# Patient Record
Sex: Female | Born: 1935
Health system: Southern US, Community
[De-identification: ages and names within clinical notes are randomized; demographics above are authoritative.]

## PROBLEM LIST (undated history)

## (undated) DIAGNOSIS — M25472 Effusion, left ankle: Secondary | ICD-10-CM

## (undated) DIAGNOSIS — R51 Headache: Secondary | ICD-10-CM

## (undated) DIAGNOSIS — M1990 Unspecified osteoarthritis, unspecified site: Secondary | ICD-10-CM

## (undated) DIAGNOSIS — M25471 Effusion, right ankle: Secondary | ICD-10-CM

## (undated) DIAGNOSIS — K219 Gastro-esophageal reflux disease without esophagitis: Secondary | ICD-10-CM

## (undated) DIAGNOSIS — I10 Essential (primary) hypertension: Secondary | ICD-10-CM

## (undated) DIAGNOSIS — M3320 Polymyositis, organ involvement unspecified: Secondary | ICD-10-CM

## (undated) HISTORY — PX: ABDOMINAL HYSTERECTOMY: SHX81

## (undated) HISTORY — DX: Unspecified osteoarthritis, unspecified site: M19.90

## (undated) HISTORY — PX: APPENDECTOMY: SHX54

## (undated) HISTORY — PX: TONSILLECTOMY: SUR1361

## (undated) HISTORY — DX: Polymyositis, organ involvement unspecified: M33.20

---

## 2012-08-05 DIAGNOSIS — K2270 Barrett's esophagus without dysplasia: Secondary | ICD-10-CM | POA: Diagnosis not present

## 2012-08-05 DIAGNOSIS — R112 Nausea with vomiting, unspecified: Secondary | ICD-10-CM | POA: Diagnosis not present

## 2012-08-05 DIAGNOSIS — Z1211 Encounter for screening for malignant neoplasm of colon: Secondary | ICD-10-CM | POA: Diagnosis not present

## 2012-08-05 DIAGNOSIS — R198 Other specified symptoms and signs involving the digestive system and abdomen: Secondary | ICD-10-CM | POA: Diagnosis not present

## 2012-08-17 DIAGNOSIS — R109 Unspecified abdominal pain: Secondary | ICD-10-CM | POA: Diagnosis not present

## 2012-08-17 DIAGNOSIS — K7689 Other specified diseases of liver: Secondary | ICD-10-CM | POA: Diagnosis not present

## 2012-08-17 DIAGNOSIS — K8020 Calculus of gallbladder without cholecystitis without obstruction: Secondary | ICD-10-CM | POA: Diagnosis not present

## 2012-08-17 DIAGNOSIS — Z90710 Acquired absence of both cervix and uterus: Secondary | ICD-10-CM | POA: Diagnosis not present

## 2012-08-17 DIAGNOSIS — N949 Unspecified condition associated with female genital organs and menstrual cycle: Secondary | ICD-10-CM | POA: Diagnosis not present

## 2012-08-25 DIAGNOSIS — R945 Abnormal results of liver function studies: Secondary | ICD-10-CM | POA: Diagnosis not present

## 2012-08-25 DIAGNOSIS — K769 Liver disease, unspecified: Secondary | ICD-10-CM | POA: Diagnosis not present

## 2012-09-23 DIAGNOSIS — R0602 Shortness of breath: Secondary | ICD-10-CM | POA: Diagnosis not present

## 2012-09-24 DIAGNOSIS — M3390 Dermatopolymyositis, unspecified, organ involvement unspecified: Secondary | ICD-10-CM | POA: Diagnosis not present

## 2012-09-24 DIAGNOSIS — R0789 Other chest pain: Secondary | ICD-10-CM | POA: Diagnosis not present

## 2012-09-24 DIAGNOSIS — D72829 Elevated white blood cell count, unspecified: Secondary | ICD-10-CM | POA: Diagnosis not present

## 2012-09-24 DIAGNOSIS — R002 Palpitations: Secondary | ICD-10-CM | POA: Diagnosis not present

## 2012-09-24 DIAGNOSIS — R635 Abnormal weight gain: Secondary | ICD-10-CM | POA: Diagnosis not present

## 2012-09-24 DIAGNOSIS — M48061 Spinal stenosis, lumbar region without neurogenic claudication: Secondary | ICD-10-CM | POA: Diagnosis not present

## 2012-09-25 DIAGNOSIS — R1033 Periumbilical pain: Secondary | ICD-10-CM | POA: Diagnosis not present

## 2012-10-08 DIAGNOSIS — R5381 Other malaise: Secondary | ICD-10-CM | POA: Diagnosis not present

## 2012-10-08 DIAGNOSIS — M629 Disorder of muscle, unspecified: Secondary | ICD-10-CM | POA: Diagnosis not present

## 2012-10-08 DIAGNOSIS — IMO0001 Reserved for inherently not codable concepts without codable children: Secondary | ICD-10-CM | POA: Diagnosis not present

## 2012-10-20 DIAGNOSIS — D72829 Elevated white blood cell count, unspecified: Secondary | ICD-10-CM | POA: Diagnosis not present

## 2012-10-20 DIAGNOSIS — M3390 Dermatopolymyositis, unspecified, organ involvement unspecified: Secondary | ICD-10-CM | POA: Diagnosis not present

## 2012-10-20 DIAGNOSIS — M48061 Spinal stenosis, lumbar region without neurogenic claudication: Secondary | ICD-10-CM | POA: Diagnosis not present

## 2012-10-20 DIAGNOSIS — R635 Abnormal weight gain: Secondary | ICD-10-CM | POA: Diagnosis not present

## 2012-10-24 DIAGNOSIS — R0602 Shortness of breath: Secondary | ICD-10-CM | POA: Diagnosis not present

## 2012-10-24 DIAGNOSIS — N289 Disorder of kidney and ureter, unspecified: Secondary | ICD-10-CM | POA: Diagnosis not present

## 2012-10-24 DIAGNOSIS — R109 Unspecified abdominal pain: Secondary | ICD-10-CM | POA: Diagnosis not present

## 2012-10-24 DIAGNOSIS — K449 Diaphragmatic hernia without obstruction or gangrene: Secondary | ICD-10-CM | POA: Diagnosis not present

## 2012-10-24 DIAGNOSIS — K7689 Other specified diseases of liver: Secondary | ICD-10-CM | POA: Diagnosis not present

## 2012-10-24 DIAGNOSIS — J984 Other disorders of lung: Secondary | ICD-10-CM | POA: Diagnosis not present

## 2012-10-24 DIAGNOSIS — R911 Solitary pulmonary nodule: Secondary | ICD-10-CM | POA: Diagnosis not present

## 2012-10-27 DIAGNOSIS — M3390 Dermatopolymyositis, unspecified, organ involvement unspecified: Secondary | ICD-10-CM | POA: Diagnosis not present

## 2012-10-27 DIAGNOSIS — R635 Abnormal weight gain: Secondary | ICD-10-CM | POA: Diagnosis not present

## 2012-10-27 DIAGNOSIS — M48061 Spinal stenosis, lumbar region without neurogenic claudication: Secondary | ICD-10-CM | POA: Diagnosis not present

## 2012-10-27 DIAGNOSIS — E041 Nontoxic single thyroid nodule: Secondary | ICD-10-CM | POA: Diagnosis not present

## 2012-11-02 DIAGNOSIS — I10 Essential (primary) hypertension: Secondary | ICD-10-CM | POA: Diagnosis not present

## 2012-11-02 DIAGNOSIS — K219 Gastro-esophageal reflux disease without esophagitis: Secondary | ICD-10-CM | POA: Diagnosis not present

## 2012-11-02 DIAGNOSIS — R609 Edema, unspecified: Secondary | ICD-10-CM | POA: Diagnosis not present

## 2012-11-02 DIAGNOSIS — E782 Mixed hyperlipidemia: Secondary | ICD-10-CM | POA: Diagnosis not present

## 2012-11-04 DIAGNOSIS — R5383 Other fatigue: Secondary | ICD-10-CM | POA: Diagnosis not present

## 2012-11-04 DIAGNOSIS — E782 Mixed hyperlipidemia: Secondary | ICD-10-CM | POA: Diagnosis not present

## 2012-11-04 DIAGNOSIS — R5381 Other malaise: Secondary | ICD-10-CM | POA: Diagnosis not present

## 2012-11-04 DIAGNOSIS — E559 Vitamin D deficiency, unspecified: Secondary | ICD-10-CM | POA: Diagnosis not present

## 2012-11-04 DIAGNOSIS — E785 Hyperlipidemia, unspecified: Secondary | ICD-10-CM | POA: Diagnosis not present

## 2012-11-04 DIAGNOSIS — R0789 Other chest pain: Secondary | ICD-10-CM | POA: Diagnosis not present

## 2012-11-04 DIAGNOSIS — R7989 Other specified abnormal findings of blood chemistry: Secondary | ICD-10-CM | POA: Diagnosis not present

## 2012-11-13 DIAGNOSIS — R7989 Other specified abnormal findings of blood chemistry: Secondary | ICD-10-CM | POA: Diagnosis not present

## 2012-11-13 DIAGNOSIS — H9190 Unspecified hearing loss, unspecified ear: Secondary | ICD-10-CM | POA: Diagnosis not present

## 2012-11-13 DIAGNOSIS — R945 Abnormal results of liver function studies: Secondary | ICD-10-CM | POA: Diagnosis not present

## 2012-11-13 DIAGNOSIS — K2960 Other gastritis without bleeding: Secondary | ICD-10-CM | POA: Diagnosis not present

## 2012-11-16 DIAGNOSIS — I70229 Atherosclerosis of native arteries of extremities with rest pain, unspecified extremity: Secondary | ICD-10-CM | POA: Diagnosis not present

## 2012-11-16 DIAGNOSIS — I70219 Atherosclerosis of native arteries of extremities with intermittent claudication, unspecified extremity: Secondary | ICD-10-CM | POA: Diagnosis not present

## 2012-12-31 DIAGNOSIS — M3320 Polymyositis, organ involvement unspecified: Secondary | ICD-10-CM | POA: Diagnosis not present

## 2012-12-31 DIAGNOSIS — Z1331 Encounter for screening for depression: Secondary | ICD-10-CM | POA: Diagnosis not present

## 2012-12-31 DIAGNOSIS — Z6834 Body mass index (BMI) 34.0-34.9, adult: Secondary | ICD-10-CM | POA: Diagnosis not present

## 2012-12-31 DIAGNOSIS — H9190 Unspecified hearing loss, unspecified ear: Secondary | ICD-10-CM | POA: Diagnosis not present

## 2012-12-31 DIAGNOSIS — K219 Gastro-esophageal reflux disease without esophagitis: Secondary | ICD-10-CM | POA: Diagnosis not present

## 2012-12-31 DIAGNOSIS — R609 Edema, unspecified: Secondary | ICD-10-CM | POA: Diagnosis not present

## 2013-01-16 ENCOUNTER — Encounter (HOSPITAL_COMMUNITY): Payer: Self-pay | Admitting: Emergency Medicine

## 2013-01-16 ENCOUNTER — Emergency Department (HOSPITAL_COMMUNITY): Payer: Medicare Other

## 2013-01-16 ENCOUNTER — Emergency Department (HOSPITAL_COMMUNITY)
Admission: EM | Admit: 2013-01-16 | Discharge: 2013-01-16 | Disposition: A | Discharge status: Home / Self Care | Payer: Medicare Other | Attending: Emergency Medicine | Admitting: Emergency Medicine

## 2013-01-16 DIAGNOSIS — J189 Pneumonia, unspecified organism: Secondary | ICD-10-CM

## 2013-01-16 DIAGNOSIS — I10 Essential (primary) hypertension: Secondary | ICD-10-CM | POA: Diagnosis not present

## 2013-01-16 DIAGNOSIS — J159 Unspecified bacterial pneumonia: Secondary | ICD-10-CM | POA: Insufficient documentation

## 2013-01-16 DIAGNOSIS — Z79899 Other long term (current) drug therapy: Secondary | ICD-10-CM | POA: Diagnosis not present

## 2013-01-16 DIAGNOSIS — R0602 Shortness of breath: Secondary | ICD-10-CM | POA: Diagnosis not present

## 2013-01-16 DIAGNOSIS — Z8739 Personal history of other diseases of the musculoskeletal system and connective tissue: Secondary | ICD-10-CM | POA: Diagnosis not present

## 2013-01-16 DIAGNOSIS — R059 Cough, unspecified: Secondary | ICD-10-CM | POA: Insufficient documentation

## 2013-01-16 DIAGNOSIS — K219 Gastro-esophageal reflux disease without esophagitis: Secondary | ICD-10-CM | POA: Insufficient documentation

## 2013-01-16 DIAGNOSIS — J9819 Other pulmonary collapse: Secondary | ICD-10-CM | POA: Diagnosis not present

## 2013-01-16 DIAGNOSIS — R05 Cough: Secondary | ICD-10-CM | POA: Insufficient documentation

## 2013-01-16 DIAGNOSIS — R079 Chest pain, unspecified: Secondary | ICD-10-CM | POA: Diagnosis not present

## 2013-01-16 HISTORY — DX: Effusion, left ankle: M25.472

## 2013-01-16 HISTORY — DX: Essential (primary) hypertension: I10

## 2013-01-16 HISTORY — DX: Gastro-esophageal reflux disease without esophagitis: K21.9

## 2013-01-16 HISTORY — DX: Effusion, left ankle: M25.471

## 2013-01-16 LAB — PRO B NATRIURETIC PEPTIDE: Pro B Natriuretic peptide (BNP): 71.3 pg/mL (ref 0–450)

## 2013-01-16 LAB — CBC
HCT: 39.5 % (ref 36.0–46.0)
MCH: 28.6 pg (ref 26.0–34.0)
MCV: 81.8 fL (ref 78.0–100.0)
Platelets: 306 10*3/uL (ref 150–400)
RBC: 4.83 MIL/uL (ref 3.87–5.11)
RDW: 15.5 % (ref 11.5–15.5)

## 2013-01-16 LAB — BASIC METABOLIC PANEL
BUN: 20 mg/dL (ref 6–23)
CO2: 29 mEq/L (ref 19–32)
Calcium: 9.4 mg/dL (ref 8.4–10.5)
Creatinine, Ser: 1.19 mg/dL — ABNORMAL HIGH (ref 0.50–1.10)

## 2013-01-16 LAB — POCT I-STAT TROPONIN I: Troponin i, poc: 0.02 ng/mL (ref 0.00–0.08)

## 2013-01-16 MED ORDER — AZITHROMYCIN 250 MG PO TABS
500.0000 mg | ORAL_TABLET | Freq: Once | ORAL | Status: AC
  Administered 2013-01-16: 500 mg via ORAL
  Filled 2013-01-16: qty 2

## 2013-01-16 MED ORDER — ONDANSETRON 4 MG PO TBDP: 4.0000 mg | ORAL_TABLET | Freq: Once | ORAL | Status: AC

## 2013-01-16 MED ORDER — IOHEXOL 350 MG/ML SOLN
100.0000 mL | Freq: Once | INTRAVENOUS | Status: AC | PRN
  Administered 2013-01-16: 100 mL via INTRAVENOUS

## 2013-01-16 MED ORDER — AZITHROMYCIN 250 MG PO TABS: 250.0000 mg | ORAL_TABLET | Freq: Every day | ORAL | Status: DC

## 2013-01-16 MED ORDER — ONDANSETRON 4 MG PO TBDP
ORAL_TABLET | ORAL | Status: AC
  Administered 2013-01-16: 4 mg via ORAL
  Filled 2013-01-16: qty 1

## 2013-01-16 NOTE — ED Notes (Signed)
Pt presents to ED with c/o shortness of breath for the past few weeks but worse today after [facility].

## 2013-01-16 NOTE — ED Notes (Signed)
Patient transported to CT 

## 2013-01-16 NOTE — ED Provider Notes (Signed)
History     CSN: [id number]  Arrival date & time 01/16/13  1604   First MD Initiated Contact with Patient 01/16/13 1737      Chief Complaint  Patient presents with  . Shortness of Breath    (Consider location/radiation/quality/duration/timing/severity/associated sxs/prior treatment) HPI Pt presents with c/o intermittent shortness of breath and cough.  She states symptoms have been going on for the past several weeks.  Not associated with exertion.  Cough is nonproductive.  No fever/chills.  No change in her baseline leg swelling.  Denies chest pain.  Describes a full feeling in her epigastric region.  No palpitations or fainting.  No orthopnea or PND.  Has continued to eat and drink normally.  No recent travel, no trauma or surgery.  No hx of DVT/PE.  No hx of cardiac problems.  There are no other associated systemic symptoms, there are no other alleviating or modifying factors.   Past Medical History  Diagnosis Date  . Hypertension   . GERD (gastroesophageal reflux disease)   . Swelling of both ankles     History reviewed. No pertinent past surgical history.  History reviewed. No pertinent family history.  History  Substance Use Topics  . Smoking status: Not on file  . Smokeless tobacco: Not on file  . Alcohol Use: Not on file    OB History   Grav Para Term Preterm Abortions TAB SAB Ect Mult Living                  Review of Systems ROS reviewed and all otherwise negative except for mentioned in HPI  Allergies  Review of patient's allergies indicates no known allergies.  Home Medications   Current Outpatient Rx  Name  Route  Sig  Dispense  Refill  . amLODipine (NORVASC) 10 MG tablet   Oral   Take 10 mg by mouth daily.         . hydrochlorothiazide (HYDRODIURIL) 25 MG tablet   Oral   Take 25 mg by mouth daily.         Marland Kitchen omeprazole (PRILOSEC) 40 MG capsule   Oral   Take 40 mg by mouth daily.         Marland Kitchen azithromycin (ZITHROMAX) 250 MG tablet    Oral   Take 1 tablet (250 mg total) by mouth daily. Take 1 po qD x 4 days   4 tablet   0     BP 133/37  Pulse 74  Temp(Src) 98.1 F (36.7 C) (Oral)  Resp 17  Ht 5' 4.25" (1.632 m)  Wt 205 lb (92.987 kg)  BMI 34.91 kg/m2  SpO2 96% Vitals reviewed Physical Exam Physical Examination: General appearance - alert, well appearing, and in no distress Mental status - alert, oriented to person, place, and time Eyes - no conjunctival injection, no scleral icterus Mouth - mucous membranes moist, pharynx normal without lesions Chest - clear to auscultation, no wheezes, rales or rhonchi, symmetric air entry, no tenderness to palpation, no increased respiratory rate Heart - normal rate, regular rhythm, normal S1, S2, no murmurs, rubs, clicks or gallops Abdomen - soft, nontender, nondistended, no masses or organomegaly, nabs Extremities - peripheral pulses normal, 1-2+ bilateral pedal edema, no clubbing or cyanosis Skin - normal coloration and turgor, no rashes  ED Course  Procedures (including critical care time)   Date: 01/16/2013  Rate: 87  Rhythm: sinus rhythm with PACs  QRS Axis: normal  Intervals: normal  ST/T Wave abnormalities: nonspecific ST/T changes  Conduction Disutrbances:none  Narrative Interpretation:   Old EKG Reviewed: none available   Labs Reviewed  BASIC METABOLIC PANEL - Abnormal; Notable for the following:    Potassium 3.2 (*)    Chloride 95 (*)    Glucose, Bld 142 (*)    Creatinine, Ser 1.19 (*)    GFR calc non Af Amer 43 (*)    GFR calc Af Amer 50 (*)    All other components within normal limits  D-DIMER, QUANTITATIVE - Abnormal; Notable for the following:    D-Dimer, Quant 0.70 (*)    All other components within normal limits  CBC  PRO B NATRIURETIC PEPTIDE  POCT I-STAT TROPONIN I  POCT I-STAT TROPONIN I   Dg Chest 2 View  01/16/2013   *RADIOLOGY REPORT*  Clinical Data: Chest pain and shortness of breath.  CHEST - 2 VIEW  Comparison: None  Findings:  The cardiac silhouette, mediastinal and hilar contours are within normal limits for age.  The lungs are clear.  No pleural effusion.  The bony thorax is intact.  Mild degenerative changes involving the thoracic spine.  IMPRESSION: No acute cardiopulmonary findings.   Original Report Authenticated By: Rudie Meyer, M.D.   Ct Angio Chest Pe W/cm &/or Wo Cm  01/16/2013   *RADIOLOGY REPORT*  Clinical Data:  Shortness of breath, elevated D-dimer, fullness below the right breast, history hypertension  CT ANGIOGRAPHY CHEST  Technique:  Multidetector CT imaging of the chest using the standard protocol during bolus administration of intravenous contrast. Multiplanar reconstructed images including MIPs were obtained and reviewed to evaluate the vascular anatomy.  Contrast: OMNIPAQUE IOHEXOL 350 MG/ML SOLN  Comparison: None  Findings: Nonspecific low attenuation focus centrally within liver 13 x 8 mm image 78, last image, incompletely visualized. Remaining visualized portions of right abdomen unremarkable. Moderate sized hiatal hernia. No thoracic adenopathy. Aorta normal caliber with minimal atherosclerotic calcification. Pulmonary arteries patent. No evidence of pulmonary embolism.  Calcified granuloma right lower lobe. Dependent atelectasis in both lower lobes. Minimal infiltrate or atelectasis laterally in right upper lobe image 38. Remaining lungs clear. No pleural effusion or pneumothorax. Scattered end plate spur formation thoracic spine.  IMPRESSION: No evidence of pulmonary embolism. Bibasilar atelectasis with small focus of minimal atelectasis or infiltrate in the right upper lobe. Moderate sized hiatal hernia. Nonspecific low attenuation focus centrally in liver 13 x 8 mm, incompletely imaged.   Original Report Authenticated By: Ulyses Southward, M.D.     1. Community acquired pneumonia       MDM  Pt presenting with intermittent shortness of breath over the past several weeks.  Also associated with  cough.  Workup in the ED reveals reassuring EKG and 2 sets of troponin.  BNP not elevated.  CXR reassuring- no evidence of fluid overload.  D-dimer was elevated, so CT angio of chest obtained.  This showed no PE, but small area of possible infiltrate in right upper lobe.  She does also have hiatal hernia incidentally.  Pt started on zithromax for possible pneumonia.  All results d/w patient and son at the bedside.  Discussed the importance of follow up with PMD in the next several days.  Discharged with strict return precautions.  Pt agreeable with plan.        Ethelda Chick, MD 01/16/13 [phone]

## 2013-01-18 DIAGNOSIS — J189 Pneumonia, unspecified organism: Secondary | ICD-10-CM | POA: Diagnosis not present

## 2013-01-18 DIAGNOSIS — R609 Edema, unspecified: Secondary | ICD-10-CM | POA: Diagnosis not present

## 2013-01-18 DIAGNOSIS — M3320 Polymyositis, organ involvement unspecified: Secondary | ICD-10-CM | POA: Diagnosis not present

## 2013-01-18 DIAGNOSIS — R05 Cough: Secondary | ICD-10-CM | POA: Diagnosis not present

## 2013-01-18 DIAGNOSIS — Z6834 Body mass index (BMI) 34.0-34.9, adult: Secondary | ICD-10-CM | POA: Diagnosis not present

## 2013-01-22 DIAGNOSIS — I10 Essential (primary) hypertension: Secondary | ICD-10-CM | POA: Diagnosis not present

## 2013-01-22 DIAGNOSIS — J45909 Unspecified asthma, uncomplicated: Secondary | ICD-10-CM | POA: Diagnosis not present

## 2013-01-22 DIAGNOSIS — Z6834 Body mass index (BMI) 34.0-34.9, adult: Secondary | ICD-10-CM | POA: Diagnosis not present

## 2013-01-22 DIAGNOSIS — R0602 Shortness of breath: Secondary | ICD-10-CM | POA: Diagnosis not present

## 2013-01-22 DIAGNOSIS — K219 Gastro-esophageal reflux disease without esophagitis: Secondary | ICD-10-CM | POA: Diagnosis not present

## 2013-01-26 DIAGNOSIS — M159 Polyosteoarthritis, unspecified: Secondary | ICD-10-CM | POA: Diagnosis not present

## 2013-01-26 DIAGNOSIS — M3320 Polymyositis, organ involvement unspecified: Secondary | ICD-10-CM | POA: Diagnosis not present

## 2013-02-11 ENCOUNTER — Other Ambulatory Visit: Payer: Self-pay | Admitting: Internal Medicine

## 2013-02-11 DIAGNOSIS — R413 Other amnesia: Secondary | ICD-10-CM | POA: Diagnosis not present

## 2013-02-11 DIAGNOSIS — E041 Nontoxic single thyroid nodule: Secondary | ICD-10-CM

## 2013-02-11 DIAGNOSIS — R609 Edema, unspecified: Secondary | ICD-10-CM | POA: Diagnosis not present

## 2013-02-11 DIAGNOSIS — Z6834 Body mass index (BMI) 34.0-34.9, adult: Secondary | ICD-10-CM | POA: Diagnosis not present

## 2013-02-11 DIAGNOSIS — M3320 Polymyositis, organ involvement unspecified: Secondary | ICD-10-CM | POA: Diagnosis not present

## 2013-02-11 DIAGNOSIS — I10 Essential (primary) hypertension: Secondary | ICD-10-CM | POA: Diagnosis not present

## 2013-02-11 DIAGNOSIS — R0602 Shortness of breath: Secondary | ICD-10-CM | POA: Diagnosis not present

## 2013-02-11 DIAGNOSIS — K219 Gastro-esophageal reflux disease without esophagitis: Secondary | ICD-10-CM | POA: Diagnosis not present

## 2013-02-18 DIAGNOSIS — E559 Vitamin D deficiency, unspecified: Secondary | ICD-10-CM | POA: Diagnosis not present

## 2013-02-18 DIAGNOSIS — I10 Essential (primary) hypertension: Secondary | ICD-10-CM | POA: Diagnosis not present

## 2013-02-18 DIAGNOSIS — E041 Nontoxic single thyroid nodule: Secondary | ICD-10-CM | POA: Diagnosis not present

## 2013-02-24 ENCOUNTER — Ambulatory Visit
Admission: RE | Admit: 2013-02-24 | Discharge: 2013-02-24 | Disposition: A | Discharge status: Home / Self Care | Payer: Medicare Other | Source: Ambulatory Visit | Attending: Internal Medicine | Admitting: Internal Medicine

## 2013-02-24 DIAGNOSIS — E041 Nontoxic single thyroid nodule: Secondary | ICD-10-CM

## 2013-02-24 DIAGNOSIS — E042 Nontoxic multinodular goiter: Secondary | ICD-10-CM | POA: Diagnosis not present

## 2013-02-25 DIAGNOSIS — I10 Essential (primary) hypertension: Secondary | ICD-10-CM | POA: Diagnosis not present

## 2013-02-25 DIAGNOSIS — Z0000 Encounter for general adult medical examination without abnormal findings: Secondary | ICD-10-CM | POA: Diagnosis not present

## 2013-02-25 DIAGNOSIS — E559 Vitamin D deficiency, unspecified: Secondary | ICD-10-CM | POA: Diagnosis not present

## 2013-02-25 DIAGNOSIS — M159 Polyosteoarthritis, unspecified: Secondary | ICD-10-CM | POA: Diagnosis not present

## 2013-02-25 DIAGNOSIS — IMO0002 Reserved for concepts with insufficient information to code with codable children: Secondary | ICD-10-CM | POA: Diagnosis not present

## 2013-02-25 DIAGNOSIS — R609 Edema, unspecified: Secondary | ICD-10-CM | POA: Diagnosis not present

## 2013-02-25 DIAGNOSIS — K2270 Barrett's esophagus without dysplasia: Secondary | ICD-10-CM | POA: Diagnosis not present

## 2013-02-25 DIAGNOSIS — M3320 Polymyositis, organ involvement unspecified: Secondary | ICD-10-CM | POA: Diagnosis not present

## 2013-02-25 DIAGNOSIS — R51 Headache: Secondary | ICD-10-CM | POA: Diagnosis not present

## 2013-02-25 DIAGNOSIS — K219 Gastro-esophageal reflux disease without esophagitis: Secondary | ICD-10-CM | POA: Diagnosis not present

## 2013-04-05 DIAGNOSIS — IMO0002 Reserved for concepts with insufficient information to code with codable children: Secondary | ICD-10-CM | POA: Diagnosis not present

## 2013-04-05 DIAGNOSIS — M3320 Polymyositis, organ involvement unspecified: Secondary | ICD-10-CM | POA: Diagnosis not present

## 2013-04-05 DIAGNOSIS — M159 Polyosteoarthritis, unspecified: Secondary | ICD-10-CM | POA: Diagnosis not present

## 2013-04-27 DIAGNOSIS — I10 Essential (primary) hypertension: Secondary | ICD-10-CM | POA: Diagnosis not present

## 2013-04-27 DIAGNOSIS — R51 Headache: Secondary | ICD-10-CM | POA: Diagnosis not present

## 2013-04-27 DIAGNOSIS — M3320 Polymyositis, organ involvement unspecified: Secondary | ICD-10-CM | POA: Diagnosis not present

## 2013-04-27 DIAGNOSIS — Z6834 Body mass index (BMI) 34.0-34.9, adult: Secondary | ICD-10-CM | POA: Diagnosis not present

## 2013-04-27 DIAGNOSIS — Z23 Encounter for immunization: Secondary | ICD-10-CM | POA: Diagnosis not present

## 2013-04-28 DIAGNOSIS — Z8673 Personal history of transient ischemic attack (TIA), and cerebral infarction without residual deficits: Secondary | ICD-10-CM | POA: Diagnosis not present

## 2013-04-28 DIAGNOSIS — R51 Headache: Secondary | ICD-10-CM | POA: Diagnosis not present

## 2013-05-08 ENCOUNTER — Encounter (HOSPITAL_COMMUNITY): Payer: Self-pay | Admitting: Emergency Medicine

## 2013-05-08 ENCOUNTER — Emergency Department (HOSPITAL_COMMUNITY): Payer: Medicare Other

## 2013-05-08 ENCOUNTER — Emergency Department (INDEPENDENT_AMBULATORY_CARE_PROVIDER_SITE_OTHER)
Admission: EM | Admit: 2013-05-08 | Discharge: 2013-05-08 | Disposition: A | Discharge status: Nursing Home (Includes ICF) | Payer: Medicare Other | Source: Home / Self Care | Attending: Family Medicine | Admitting: Family Medicine

## 2013-05-08 ENCOUNTER — Emergency Department (HOSPITAL_COMMUNITY)
Admission: EM | Admit: 2013-05-08 | Discharge: 2013-05-08 | Disposition: A | Discharge status: Home / Self Care | Payer: Medicare Other | Attending: Emergency Medicine | Admitting: Emergency Medicine

## 2013-05-08 DIAGNOSIS — R319 Hematuria, unspecified: Secondary | ICD-10-CM | POA: Insufficient documentation

## 2013-05-08 DIAGNOSIS — M545 Low back pain, unspecified: Secondary | ICD-10-CM | POA: Diagnosis not present

## 2013-05-08 DIAGNOSIS — Z90710 Acquired absence of both cervix and uterus: Secondary | ICD-10-CM | POA: Diagnosis not present

## 2013-05-08 DIAGNOSIS — R748 Abnormal levels of other serum enzymes: Secondary | ICD-10-CM | POA: Diagnosis not present

## 2013-05-08 DIAGNOSIS — Z791 Long term (current) use of non-steroidal anti-inflammatories (NSAID): Secondary | ICD-10-CM | POA: Diagnosis not present

## 2013-05-08 DIAGNOSIS — K8020 Calculus of gallbladder without cholecystitis without obstruction: Secondary | ICD-10-CM | POA: Diagnosis not present

## 2013-05-08 DIAGNOSIS — R1013 Epigastric pain: Secondary | ICD-10-CM | POA: Diagnosis not present

## 2013-05-08 DIAGNOSIS — I10 Essential (primary) hypertension: Secondary | ICD-10-CM | POA: Diagnosis not present

## 2013-05-08 DIAGNOSIS — K219 Gastro-esophageal reflux disease without esophagitis: Secondary | ICD-10-CM | POA: Diagnosis not present

## 2013-05-08 DIAGNOSIS — R197 Diarrhea, unspecified: Secondary | ICD-10-CM | POA: Diagnosis not present

## 2013-05-08 DIAGNOSIS — R52 Pain, unspecified: Secondary | ICD-10-CM

## 2013-05-08 DIAGNOSIS — R7989 Other specified abnormal findings of blood chemistry: Secondary | ICD-10-CM

## 2013-05-08 DIAGNOSIS — Z79899 Other long term (current) drug therapy: Secondary | ICD-10-CM | POA: Insufficient documentation

## 2013-05-08 DIAGNOSIS — Z792 Long term (current) use of antibiotics: Secondary | ICD-10-CM | POA: Insufficient documentation

## 2013-05-08 DIAGNOSIS — R109 Unspecified abdominal pain: Secondary | ICD-10-CM

## 2013-05-08 DIAGNOSIS — R944 Abnormal results of kidney function studies: Secondary | ICD-10-CM | POA: Diagnosis not present

## 2013-05-08 LAB — COMPREHENSIVE METABOLIC PANEL
ALT: 19 U/L (ref 0–35)
AST: 16 U/L (ref 0–37)
Albumin: 3.5 g/dL (ref 3.5–5.2)
Calcium: 9.4 mg/dL (ref 8.4–10.5)
Sodium: 140 mEq/L (ref 135–145)
Total Protein: 6.7 g/dL (ref 6.0–8.3)

## 2013-05-08 LAB — CBC WITH DIFFERENTIAL/PLATELET
Basophils Absolute: 0 10*3/uL (ref 0.0–0.1)
Basophils Relative: 0 % (ref 0–1)
Eosinophils Absolute: 0.1 10*3/uL (ref 0.0–0.7)
Eosinophils Relative: 2 % (ref 0–5)
HCT: 36.3 % (ref 36.0–46.0)
Hemoglobin: 12.9 g/dL (ref 12.0–15.0)
Lymphs Abs: 1.1 10*3/uL (ref 0.7–4.0)
MCH: 30.5 pg (ref 26.0–34.0)
MCHC: 35.5 g/dL (ref 30.0–36.0)
MCV: 85.8 fL (ref 78.0–100.0)
Monocytes Absolute: 0.6 10*3/uL (ref 0.1–1.0)
Monocytes Relative: 7 % (ref 3–12)
Neutro Abs: 6.9 10*3/uL (ref 1.7–7.7)
RDW: 15.3 % (ref 11.5–15.5)

## 2013-05-08 MED ORDER — HYDROCODONE-ACETAMINOPHEN 5-325 MG PO TABS: 1.0000 | ORAL_TABLET | Freq: Four times a day (QID) | ORAL | Status: DC | PRN

## 2013-05-08 NOTE — ED Provider Notes (Signed)
CSN: [id number]     Arrival date & time 05/08/13  1059 History   First MD Initiated Contact with Patient 05/08/13 1115     Chief Complaint  Patient presents with  . Abdominal Pain  . Diarrhea   (Consider location/radiation/quality/duration/timing/severity/associated sxs/prior Treatment) Patient is a 79 y.o. female presenting with abdominal pain and diarrhea.  Abdominal Pain Associated symptoms: diarrhea, hematuria and nausea   Associated symptoms: no chest pain, no constipation, no dysuria, no shortness of breath and no vomiting   Diarrhea Associated symptoms: abdominal pain   Associated symptoms: no headaches and no vomiting   Patient is a 79 yo female with a history of GERD and appendectomy who presents with a one week history of epigastric fullness and increased frequency of stools. Patient notes last Sunday she went out to dinner and has felt sick since then. Noted started out with fullness in epigastrium and bloating with gas. Notes initially had complaint of diarrhea, though once clarified then states had increased frequency of stooling. Has progressively gotten worse. Now pain is 7/10. Notes some nausea. No vomiting, blood in stool, dysuria, chest pain, frequency, HA, vision changes. States has been taking in good amounts of liquid. Patient notes she is not currently on any antibiotics. She is taking her mobic for polymyositis, per the patient.  Past Medical History  Diagnosis Date  . Hypertension   . GERD (gastroesophageal reflux disease)   . Swelling of both ankles    History reviewed. No pertinent past surgical history. History reviewed. No pertinent family history. History  Substance Use Topics  . Smoking status: Never Smoker   . Smokeless tobacco: Not on file  . Alcohol Use: No   OB History   Grav Para Term Preterm Abortions TAB SAB Ect Mult Living                 Review of Systems  Constitutional: Positive for appetite change.  Eyes: Negative for visual  disturbance.  Respiratory: Negative for chest tightness and shortness of breath.   Cardiovascular: Negative for chest pain and leg swelling.  Gastrointestinal: Positive for nausea, abdominal pain and diarrhea. Negative for vomiting, constipation, blood in stool and abdominal distention.  Genitourinary: Positive for hematuria. Negative for dysuria and difficulty urinating.  Musculoskeletal: Positive for back pain (low back).  Neurological: Negative for headaches.  All other systems reviewed and are negative.    Allergies  Review of patient's allergies indicates no known allergies.  Home Medications   Current Outpatient Rx  Name  Route  Sig  Dispense  Refill  . amLODipine (NORVASC) 10 MG tablet   Oral   Take 10 mg by mouth daily.         Marland Kitchen azithromycin (ZITHROMAX) 250 MG tablet   Oral   Take 1 tablet (250 mg total) by mouth daily. Take 1 po qD x 4 days   4 tablet   0   . FOLIC ACID PO   Oral   Take by mouth.         . hydrochlorothiazide (HYDRODIURIL) 25 MG tablet   Oral   Take 25 mg by mouth daily.         . Meloxicam (MOBIC PO)   Oral   Take by mouth.         Marland Kitchen omeprazole (PRILOSEC) 40 MG capsule   Oral   Take 40 mg by mouth daily.          BP 151/62  Pulse 82  Temp(Src) 98.3  F (36.8 C) (Oral)  Resp 18  SpO2 96% Physical Exam  Constitutional: She appears well-developed and well-nourished. No distress.  HENT:  Head: Normocephalic and atraumatic.  Mouth/Throat: Oropharynx is clear and moist.  Eyes: Pupils are equal, round, and reactive to light.  Neck: Neck supple.  Cardiovascular: Normal rate, regular rhythm and normal heart sounds.   Pulmonary/Chest: Effort normal and breath sounds normal. No respiratory distress. She has no wheezes. She has no rales.  Abdominal: Soft. Bowel sounds are normal. She exhibits no distension and no mass. There is tenderness (epigastrium and mild in RUQ). There is no rebound and no guarding.  Musculoskeletal: She  exhibits no edema.  Neurological: She is alert.  Skin: Skin is warm and dry.    ED Course  Procedures (including critical care time) Labs Review Labs Reviewed  COMPREHENSIVE METABOLIC PANEL - Abnormal; Notable for the following:    Glucose, Bld 127 (*)    Creatinine, Ser 1.43 (*)    GFR calc non Af Amer 34 (*)    GFR calc Af Amer 40 (*)    All other components within normal limits  CBC WITH DIFFERENTIAL - Abnormal; Notable for the following:    Neutrophils Relative % 79 (*)    All other components within normal limits  LIPASE, BLOOD   Imaging Review US Abdomen Complete  05/08/2013   CLINICAL DATA:  Abdominal pain.  EXAM: ULTRASOUND ABDOMEN COMPLETE  COMPARISON:  None.  FINDINGS: Gallbladder  Large solitary gallstone is noted without gallbladder wall thickening or pericholecystic fluid. No sonographic Eulah Pont sign is noted.  Common bile duct  Diameter: Measures 3.5 mm which is within normal limits.  Liver  1.5 cm simple cyst is noted in left hepatic lobe. Within normal limits in parenchymal echogenicity.  IVC  No abnormality visualized.  Pancreas  Visualized portion unremarkable.  Spleen  Size and appearance within normal limits.  Right Kidney  Length: Measures 9.1 cm. Echogenicity within normal limits. No mass or hydronephrosis visualized.  Left Kidney  Length: Measures 9.9 cm. Echogenicity within normal limits. No mass or hydronephrosis visualized.  Abdominal aorta  No aneurysm visualized.  IMPRESSION: Large solitary gallstone is noted without evidence of cholecystitis.   Electronically Signed   By: Roque Lias M.D.   On: 05/08/2013 16:05    EKG Interpretation   None       MDM   1. Gallbladder stone without cholecystitis or obstruction   2. Elevated serum creatinine   3. Abdominal pain   4. Diarrhea    11:40 am: Patient seen and examined. Patient with one week history of abdominal fullness and increased stool frequency. Patient has epigastric tenderness and mild RUQ tenderness  to palpation. Still has gall bladder. Is taking meloxicam. Concern in this case would be for pancreatitis, cholelithiasis, GERD, peptic ulcer. Will obtain CBC with diff, CMET, and lipase to evaluate for cause. Imaging decision will be pending lab results.  1:15 pm: spoke with patients son who stated patient has been told she has gall stones previously when they lived in [place]. Discussed this with the patient and she said at one point she was told she had them, but then was told she didn't. Discussed that labs for her pancreas, liver, and gall bladder look normal at this time. Though given this history we will obtain a RUQ [place] to evaluate for gall stones. Additionally her Cr was slightly elevated from previous reading.   [place] abd revealed single large gall stone with no evidence of  cholecystitis. Discussed this finding with the patient and son. There is no indication for urgent surgery at this time with no signs of cholecystitis on [place], stable vital signs, normal AST, ALT, alk phos, and lipase with no elevated WBC. I discussed with the patient and her son that she would benefit from having her gall bladder out, but it would be appropriate to happen in the outpatient setting. Advised that she could continue to have the pain or could develop an infection if she did not have her gall bladder removed in the future. She should avoid fatty foods. They voiced understanding of this and are to call PCP on Monday for follow-up. Additionally patient with rise in Cr from previous reading. Potentially related to meloxicam vs worsening of likely underlying CKD. Advised to stop meloxicam and have Cr rechecked in 1 week. Will give vicodin for pain until f/u arranged with PCP. Patient was given return precautions. This patient was discussed with my attending Dr Radford Pax.  Marikay Alar, MD [facility] PGY-2 05/08/13 7:19 pm  Glori Luis, MD 05/08/13 [facility]

## 2013-05-08 NOTE — ED Provider Notes (Addendum)
CSN: [id number]     Arrival date & time 05/08/13  7829 History   None    Chief Complaint  Patient presents with  . Abdominal Pain   (Consider location/radiation/quality/duration/timing/severity/associated sxs/prior Treatment) Patient is a 79 y.o. female presenting with abdominal pain. The history is provided by the patient.  Abdominal Pain This is a new problem. The current episode started more than 1 week ago. The problem has been gradually worsening. Associated symptoms include abdominal pain.    Past Medical History  Diagnosis Date  . Hypertension   . GERD (gastroesophageal reflux disease)   . Swelling of both ankles    History reviewed. No pertinent past surgical history. History reviewed. No pertinent family history. History  Substance Use Topics  . Smoking status: Never Smoker   . Smokeless tobacco: Not on file  . Alcohol Use: No   OB History   Grav Para Term Preterm Abortions TAB SAB Ect Mult Living                 Review of Systems  Constitutional: Positive for appetite change. Negative for fever.  HENT: Negative.   Respiratory: Negative.   Gastrointestinal: Positive for nausea, abdominal pain and diarrhea. Negative for vomiting, constipation, blood in stool and abdominal distention.  Genitourinary: Negative.     Allergies  Review of patient's allergies indicates no known allergies.  Home Medications   Current Outpatient Rx  Name  Route  Sig  Dispense  Refill  . FOLIC ACID PO   Oral   Take by mouth.         . Meloxicam (MOBIC PO)   Oral   Take by mouth.         Marland Kitchen amLODipine (NORVASC) 10 MG tablet   Oral   Take 10 mg by mouth daily.         Marland Kitchen azithromycin (ZITHROMAX) 250 MG tablet   Oral   Take 1 tablet (250 mg total) by mouth daily. Take 1 po qD x 4 days   4 tablet   0   . hydrochlorothiazide (HYDRODIURIL) 25 MG tablet   Oral   Take 25 mg by mouth daily.         Marland Kitchen omeprazole (PRILOSEC) 40 MG capsule   Oral   Take 40 mg by mouth  daily.          BP 158/83  Pulse 82  Temp(Src) 98.3 F (36.8 C) (Oral)  Resp 20  SpO2 96% Physical Exam  Nursing note and vitals reviewed. Constitutional: She is oriented to person, place, and time. She appears well-developed and well-nourished. She appears distressed.  HENT:  Mouth/Throat: Oropharynx is clear and moist.  Neck: Normal range of motion. Neck supple.  Cardiovascular: Normal rate, regular rhythm, normal heart sounds and intact distal pulses.   Pulmonary/Chest: Effort normal and breath sounds normal.  Abdominal: Soft. Bowel sounds are normal. She exhibits no distension and no mass. There is no hepatosplenomegaly. There is tenderness in the epigastric area. There is no rigidity, no rebound, no guarding, no CVA tenderness, no tenderness at McBurney's point and negative Murphy's sign.  Lymphadenopathy:    She has no cervical adenopathy.  Neurological: She is alert and oriented to person, place, and time.  Skin: Skin is warm and dry.    ED Course  Procedures (including critical care time) Labs Review Labs Reviewed - No data to display Imaging Review No results found.    MDM  Sent for eval of worsening upper abd  pain for 1 week.no fever ,no n/v.    Linna Hoff, MD 05/08/13 1029  Linna Hoff, MD 05/08/13 1030

## 2013-05-08 NOTE — ED Notes (Signed)
Pt sent here from [facility] for mid epigastric pain for over one week, having nausea and diarrhea.

## 2013-05-08 NOTE — ED Notes (Signed)
Patient transported to Ultrasound 

## 2013-05-08 NOTE — ED Notes (Signed)
Contacted [place] tech-approx. eta.

## 2013-05-08 NOTE — ED Notes (Signed)
Pt  Reports  Epigastric   Pain    With   Nausea        X  6 days

## 2013-05-11 ENCOUNTER — Ambulatory Visit (INDEPENDENT_AMBULATORY_CARE_PROVIDER_SITE_OTHER): Payer: Medicare Other | Admitting: General Surgery

## 2013-05-11 ENCOUNTER — Encounter (INDEPENDENT_AMBULATORY_CARE_PROVIDER_SITE_OTHER): Payer: Self-pay | Admitting: General Surgery

## 2013-05-11 VITALS — BP 138/80 | HR 68 | Temp 98.6°F | Resp 14 | Ht 64.25 in | Wt 206.0 lb

## 2013-05-11 DIAGNOSIS — K8020 Calculus of gallbladder without cholecystitis without obstruction: Secondary | ICD-10-CM | POA: Insufficient documentation

## 2013-05-11 NOTE — Progress Notes (Signed)
The patient's office visit was documented as a preoperative history and physical for surgery in the near future.

## 2013-05-11 NOTE — H&P (Signed)
Heather Olson is an 79 y.o. female.   Chief Complaint: Abdominal pain related to large gallstone. HPI: The patient was recently seen in the [facility] emergency department with abdominal pain ultrasound done at that time demonstrated a large gallstone obstructing the neck of the gallbladder. She was sent for surgical consultation.  Past Medical History  Diagnosis Date  . Hypertension   . GERD (gastroesophageal reflux disease)   . Swelling of both ankles   . Arthritis     Past Surgical History  Procedure Laterality Date  . Abdominal hysterectomy      Family History  Problem Relation Age of Onset  . Diabetes Mother    Social History:  reports that she has never smoked. She has never used smokeless tobacco. She reports that she does not drink alcohol or use illicit drugs.  Allergies: No Known Allergies   (Not in a hospital admission)  No results found for this or any previous visit (from the past 48 hour(s)). No results found.  Review of Systems  Constitutional: Negative for fever and chills.  Gastrointestinal: Positive for abdominal pain and diarrhea.  Musculoskeletal: Positive for myalgias (hx of polymyositis).  All other systems reviewed and are negative.    Blood pressure 138/80, pulse 68, temperature 98.6 F (37 C), temperature source Temporal, resp. rate 14, height 5' 4.25" (1.632 m), weight 206 lb (93.441 kg). Physical Exam  Constitutional: She is oriented to person, place, and time. She appears well-developed and well-nourished.  Overweight  HENT:  Head: Normocephalic and atraumatic.  Eyes: Conjunctivae and EOM are normal. Pupils are equal, round, and reactive to light.  Neck: Normal range of motion. Neck supple.  Cardiovascular: Normal rate, regular rhythm, normal heart sounds and intact distal pulses.   Respiratory: Effort normal and breath sounds normal.  GI: Soft. Normal appearance and bowel sounds are normal. There is tenderness in the right upper quadrant.  There is no rigidity, no guarding, no tenderness at McBurney's point and negative Murphy's sign.    Musculoskeletal: Normal range of motion.  Neurological: She is alert and oriented to person, place, and time. She has normal reflexes.  Skin: Skin is warm and dry.  Psychiatric: She has a normal mood and affect. Her behavior is normal. Judgment and thought content normal.     Assessment/Plan Symptomatic cholelithiasis  Needs cholecystectomy with IOC   Will look to next week for surgery.  Cherylynn Ridges 05/11/2013, 11:54 AM

## 2013-05-14 NOTE — ED Provider Notes (Signed)
I saw and evaluated the patient, reviewed the resident's note and I agree with the findings and plan.   .Face to face Exam:  General:  Awake HEENT:  Atraumatic Resp:  Normal effort Abd:  Nondistended Neuro:No focal weakness   Nelia Shi, MD 05/14/13 0030

## 2013-05-24 ENCOUNTER — Encounter (HOSPITAL_COMMUNITY): Payer: Self-pay

## 2013-05-24 ENCOUNTER — Other Ambulatory Visit (HOSPITAL_COMMUNITY): Payer: Self-pay | Admitting: *Deleted

## 2013-05-24 NOTE — Pre-Procedure Instructions (Signed)
Ember Henrikson  05/24/2013   Your procedure is scheduled on:  Friday, May 28, 2013 at 7:30 AM.   Report to [facility] Entrance "A" at 5:30 AM.   Call this number if you have problems the morning of surgery: [phone]   Remember:   Do not eat food or drink liquids after midnight Thursday, 05/17/13.   Take these medicines the morning of surgery with A SIP OF WATER: omeprazole (PRILOSEC), predniSONE (DELTASONE)  Stop Vitamins as of today, 05/25/13.     Do not wear jewelry, make-up or nail polish.  Do not wear lotions, powders, or perfumes. You may wear deodorant.  Do not shave 48 hours prior to surgery.   Do not bring valuables to the hospital.  [facility] is not responsible                  for any belongings or valuables.               Contacts, dentures or bridgework may not be worn into surgery.  Leave suitcase in the car. After surgery it may be brought to your room.  For patients admitted to the hospital, discharge time is determined by your                treatment team.               Patients discharged the day of surgery will not be allowed to drive  home.  Name and phone number of your driver: Family/friend   Special Instructions: Shower using CHG 2 nights before surgery and the night before surgery.  If you shower the day of surgery use CHG.  Use special wash - you have one bottle of CHG for all showers.  You should use approximately 1/3 of the bottle for each shower.   Please read over the following fact sheets that you were given: Pain Booklet, Coughing and Deep Breathing and Surgical Site Infection Prevention

## 2013-05-25 ENCOUNTER — Encounter (HOSPITAL_COMMUNITY)
Admission: RE | Admit: 2013-05-25 | Discharge: 2013-05-25 | Disposition: A | Discharge status: Home / Self Care | Payer: Medicare Other | Source: Ambulatory Visit | Attending: General Surgery | Admitting: General Surgery

## 2013-05-25 ENCOUNTER — Encounter (HOSPITAL_COMMUNITY): Payer: Self-pay

## 2013-05-25 DIAGNOSIS — K219 Gastro-esophageal reflux disease without esophagitis: Secondary | ICD-10-CM | POA: Diagnosis not present

## 2013-05-25 DIAGNOSIS — K8010 Calculus of gallbladder with chronic cholecystitis without obstruction: Secondary | ICD-10-CM | POA: Diagnosis not present

## 2013-05-25 DIAGNOSIS — I10 Essential (primary) hypertension: Secondary | ICD-10-CM | POA: Diagnosis not present

## 2013-05-25 HISTORY — DX: Headache: R51

## 2013-05-25 LAB — COMPREHENSIVE METABOLIC PANEL
ALT: 17 U/L (ref 0–35)
BUN: 19 mg/dL (ref 6–23)
Calcium: 9.5 mg/dL (ref 8.4–10.5)
Creatinine, Ser: 1.36 mg/dL — ABNORMAL HIGH (ref 0.50–1.10)
GFR calc Af Amer: 42 mL/min — ABNORMAL LOW (ref >90)
GFR calc non Af Amer: 36 mL/min — ABNORMAL LOW (ref >90)
Glucose, Bld: 114 mg/dL — ABNORMAL HIGH (ref 70–99)
Potassium: 3.7 mEq/L (ref 3.5–5.1)
Sodium: 141 mEq/L (ref 135–145)
Total Protein: 6.8 g/dL (ref 6.0–8.3)

## 2013-05-25 LAB — CBC WITH DIFFERENTIAL/PLATELET
Eosinophils Absolute: 0.2 10*3/uL (ref 0.0–0.7)
Eosinophils Relative: 2 % (ref 0–5)
HCT: 36.1 % (ref 36.0–46.0)
Lymphocytes Relative: 18 % (ref 12–46)
Lymphs Abs: 1.7 10*3/uL (ref 0.7–4.0)
MCH: 30.7 pg (ref 26.0–34.0)
MCHC: 35.2 g/dL (ref 30.0–36.0)
MCV: 87.2 fL (ref 78.0–100.0)
Monocytes Absolute: 0.5 10*3/uL (ref 0.1–1.0)
Platelets: 333 10*3/uL (ref 150–400)
RBC: 4.14 MIL/uL (ref 3.87–5.11)

## 2013-05-27 MED ORDER — DEXTROSE 5 % IV SOLN
2.0000 g | INTRAVENOUS | Status: AC
  Administered 2013-05-28: 2 g via INTRAVENOUS
  Filled 2013-05-27: qty 2

## 2013-05-28 ENCOUNTER — Ambulatory Visit (HOSPITAL_COMMUNITY): Payer: Medicare Other | Admitting: Anesthesiology

## 2013-05-28 ENCOUNTER — Encounter (HOSPITAL_COMMUNITY): Payer: Medicare Other | Admitting: Anesthesiology

## 2013-05-28 ENCOUNTER — Ambulatory Visit (HOSPITAL_COMMUNITY): Payer: Medicare Other

## 2013-05-28 ENCOUNTER — Encounter (HOSPITAL_COMMUNITY)
Admission: RE | Disposition: A | Discharge status: Home / Self Care | Payer: Self-pay | Source: Ambulatory Visit | Attending: General Surgery

## 2013-05-28 ENCOUNTER — Encounter (HOSPITAL_COMMUNITY): Payer: Self-pay | Admitting: *Deleted

## 2013-05-28 ENCOUNTER — Ambulatory Visit (HOSPITAL_COMMUNITY)
Admission: RE | Admit: 2013-05-28 | Discharge: 2013-05-28 | Disposition: A | Discharge status: Home / Self Care | Payer: Medicare Other | Source: Ambulatory Visit | Attending: General Surgery | Admitting: General Surgery

## 2013-05-28 DIAGNOSIS — K219 Gastro-esophageal reflux disease without esophagitis: Secondary | ICD-10-CM | POA: Diagnosis not present

## 2013-05-28 DIAGNOSIS — I10 Essential (primary) hypertension: Secondary | ICD-10-CM | POA: Insufficient documentation

## 2013-05-28 DIAGNOSIS — K8020 Calculus of gallbladder without cholecystitis without obstruction: Secondary | ICD-10-CM | POA: Diagnosis not present

## 2013-05-28 DIAGNOSIS — R0602 Shortness of breath: Secondary | ICD-10-CM | POA: Diagnosis not present

## 2013-05-28 DIAGNOSIS — K8010 Calculus of gallbladder with chronic cholecystitis without obstruction: Secondary | ICD-10-CM | POA: Insufficient documentation

## 2013-05-28 HISTORY — PX: CHOLECYSTECTOMY: SHX55

## 2013-05-28 SURGERY — LAPAROSCOPIC CHOLECYSTECTOMY WITH INTRAOPERATIVE CHOLANGIOGRAM
Anesthesia: General | Site: Abdomen | Wound class: Clean Contaminated

## 2013-05-28 MED ORDER — HYDROMORPHONE HCL PF 1 MG/ML IJ SOLN
INTRAMUSCULAR | Status: AC
  Filled 2013-05-28: qty 1

## 2013-05-28 MED ORDER — BUPIVACAINE-EPINEPHRINE PF 0.25-1:200000 % IJ SOLN
INTRAMUSCULAR | Status: AC
  Filled 2013-05-28: qty 30

## 2013-05-28 MED ORDER — ROCURONIUM BROMIDE 100 MG/10ML IV SOLN
INTRAVENOUS | Status: DC | PRN
  Administered 2013-05-28: 50 mg via INTRAVENOUS

## 2013-05-28 MED ORDER — OXYCODONE-ACETAMINOPHEN 5-325 MG PO TABS: 1.0000 | ORAL_TABLET | ORAL | Status: DC | PRN

## 2013-05-28 MED ORDER — SODIUM CHLORIDE 0.9 % IR SOLN
Status: DC | PRN
  Administered 2013-05-28: 1000 mL

## 2013-05-28 MED ORDER — PROPOFOL 10 MG/ML IV BOLUS
INTRAVENOUS | Status: DC | PRN
  Administered 2013-05-28: 110 mg via INTRAVENOUS

## 2013-05-28 MED ORDER — FENTANYL CITRATE 0.05 MG/ML IJ SOLN: 50.0000 ug | Freq: Once | INTRAMUSCULAR | Status: DC

## 2013-05-28 MED ORDER — ONDANSETRON HCL 4 MG/2ML IJ SOLN
INTRAMUSCULAR | Status: DC | PRN
  Administered 2013-05-28: 4 mg via INTRAVENOUS

## 2013-05-28 MED ORDER — FENTANYL CITRATE 0.05 MG/ML IJ SOLN
INTRAMUSCULAR | Status: DC | PRN
  Administered 2013-05-28 (×2): 50 ug via INTRAVENOUS

## 2013-05-28 MED ORDER — HYDROMORPHONE HCL PF 1 MG/ML IJ SOLN
0.2500 mg | INTRAMUSCULAR | Status: DC | PRN
  Administered 2013-05-28: 0.5 mg via INTRAVENOUS
  Administered 2013-05-28 (×2): 0.25 mg via INTRAVENOUS

## 2013-05-28 MED ORDER — NEOSTIGMINE METHYLSULFATE 1 MG/ML IJ SOLN
INTRAMUSCULAR | Status: DC | PRN
  Administered 2013-05-28: 4 mg via INTRAVENOUS

## 2013-05-28 MED ORDER — OXYCODONE HCL 5 MG PO TABS
10.0000 mg | ORAL_TABLET | Freq: Once | ORAL | Status: AC
  Administered 2013-05-28: 10 mg via ORAL
  Filled 2013-05-28: qty 2

## 2013-05-28 MED ORDER — MIDAZOLAM HCL 5 MG/5ML IJ SOLN
INTRAMUSCULAR | Status: DC | PRN
  Administered 2013-05-28: 1 mg via INTRAVENOUS

## 2013-05-28 MED ORDER — MIDAZOLAM HCL 2 MG/2ML IJ SOLN: 1.0000 mg | INTRAMUSCULAR | Status: DC | PRN

## 2013-05-28 MED ORDER — OXYCODONE HCL 5 MG PO TABS
5.0000 mg | ORAL_TABLET | Freq: Once | ORAL | Status: DC | PRN
  Filled 2013-05-28: qty 1

## 2013-05-28 MED ORDER — HYDROCODONE-ACETAMINOPHEN 5-325 MG PO TABS: 1.0000 | ORAL_TABLET | Freq: Four times a day (QID) | ORAL | Status: DC | PRN

## 2013-05-28 MED ORDER — GLYCOPYRROLATE 0.2 MG/ML IJ SOLN
INTRAMUSCULAR | Status: DC | PRN
  Administered 2013-05-28: 0.6 mg via INTRAVENOUS

## 2013-05-28 MED ORDER — OXYCODONE HCL 5 MG/5ML PO SOLN: 5.0000 mg | Freq: Once | ORAL | Status: DC | PRN

## 2013-05-28 MED ORDER — BUPIVACAINE-EPINEPHRINE 0.25% -1:200000 IJ SOLN
INTRAMUSCULAR | Status: DC | PRN
  Administered 2013-05-28: 10 mL

## 2013-05-28 MED ORDER — LIDOCAINE HCL (CARDIAC) 20 MG/ML IV SOLN
INTRAVENOUS | Status: DC | PRN
  Administered 2013-05-28: 80 mg via INTRAVENOUS

## 2013-05-28 MED ORDER — CHLORHEXIDINE GLUCONATE 4 % EX LIQD: 1.0000 "application " | Freq: Once | CUTANEOUS | Status: DC

## 2013-05-28 MED ORDER — 0.9 % SODIUM CHLORIDE (POUR BTL) OPTIME
TOPICAL | Status: DC | PRN
  Administered 2013-05-28: 1000 mL

## 2013-05-28 MED ORDER — SODIUM CHLORIDE 0.9 % IV SOLN
INTRAVENOUS | Status: DC | PRN
  Administered 2013-05-28: 08:00:00

## 2013-05-28 MED ORDER — LACTATED RINGERS IV SOLN
INTRAVENOUS | Status: DC | PRN
  Administered 2013-05-28 (×2): via INTRAVENOUS

## 2013-05-28 SURGICAL SUPPLY — 45 items
APPLIER CLIP 5 13 M/L LIGAMAX5 (MISCELLANEOUS) ×2
APPLIER CLIP ROT 10 11.4 M/L (STAPLE)
BLADE SURG ROTATE 9660 (MISCELLANEOUS) IMPLANT
CANISTER SUCTION 2500CC (MISCELLANEOUS) ×2 IMPLANT
CHLORAPREP W/TINT 26ML (MISCELLANEOUS) ×2 IMPLANT
CLIP APPLIE 5 13 M/L LIGAMAX5 (MISCELLANEOUS) ×1 IMPLANT
CLIP APPLIE ROT 10 11.4 M/L (STAPLE) IMPLANT
COVER MAYO STAND STRL (DRAPES) IMPLANT
COVER SURGICAL LIGHT HANDLE (MISCELLANEOUS) ×2 IMPLANT
DECANTER SPIKE VIAL GLASS SM (MISCELLANEOUS) IMPLANT
DERMABOND ADVANCED (GAUZE/BANDAGES/DRESSINGS) ×1
DERMABOND ADVANCED .7 DNX12 (GAUZE/BANDAGES/DRESSINGS) ×1 IMPLANT
DRAPE C-ARM 42X72 X-RAY (DRAPES) IMPLANT
DRAPE UTILITY 15X26 W/TAPE STR (DRAPE) ×4 IMPLANT
ELECT REM PT RETURN 9FT ADLT (ELECTROSURGICAL) ×2
ELECTRODE REM PT RTRN 9FT ADLT (ELECTROSURGICAL) ×1 IMPLANT
GLOVE BIO SURGEON STRL SZ7.5 (GLOVE) ×2 IMPLANT
GLOVE BIOGEL PI IND STRL 7.0 (GLOVE) ×2 IMPLANT
GLOVE BIOGEL PI IND STRL 7.5 (GLOVE) ×1 IMPLANT
GLOVE BIOGEL PI IND STRL 8 (GLOVE) ×1 IMPLANT
GLOVE BIOGEL PI INDICATOR 7.0 (GLOVE) ×2
GLOVE BIOGEL PI INDICATOR 7.5 (GLOVE) ×1
GLOVE BIOGEL PI INDICATOR 8 (GLOVE) ×1
GLOVE ECLIPSE 7.5 STRL STRAW (GLOVE) ×2 IMPLANT
GLOVE SURG SS PI 7.0 STRL IVOR (GLOVE) ×2 IMPLANT
GOWN STRL NON-REIN LRG LVL3 (GOWN DISPOSABLE) ×6 IMPLANT
KIT BASIN OR (CUSTOM PROCEDURE TRAY) ×2 IMPLANT
KIT ROOM TURNOVER OR (KITS) ×2 IMPLANT
NS IRRIG 1000ML POUR BTL (IV SOLUTION) ×2 IMPLANT
PAD ARMBOARD 7.5X6 YLW CONV (MISCELLANEOUS) ×4 IMPLANT
POUCH SPECIMEN RETRIEVAL 10MM (ENDOMECHANICALS) ×2 IMPLANT
SCISSORS LAP 5X35 DISP (ENDOMECHANICALS) IMPLANT
SET CHOLANGIOGRAPH 5 50 .035 (SET/KITS/TRAYS/PACK) IMPLANT
SET IRRIG TUBING LAPAROSCOPIC (IRRIGATION / IRRIGATOR) ×2 IMPLANT
SLEEVE ENDOPATH XCEL 5M (ENDOMECHANICALS) ×4 IMPLANT
SPECIMEN JAR SMALL (MISCELLANEOUS) ×2 IMPLANT
SUT MNCRL AB 4-0 PS2 18 (SUTURE) ×2 IMPLANT
TOWEL OR 17X24 6PK STRL BLUE (TOWEL DISPOSABLE) ×2 IMPLANT
TOWEL OR 17X26 10 PK STRL BLUE (TOWEL DISPOSABLE) ×2 IMPLANT
TOWEL OR NON WOVEN STRL DISP B (DISPOSABLE) ×2 IMPLANT
TRAY LAPAROSCOPIC (CUSTOM PROCEDURE TRAY) ×2 IMPLANT
TROCAR XCEL BLUNT TIP 100MML (ENDOMECHANICALS) ×2 IMPLANT
TROCAR XCEL NON-BLD 11X100MML (ENDOMECHANICALS) IMPLANT
TROCAR XCEL NON-BLD 5MMX100MML (ENDOMECHANICALS) ×2 IMPLANT
WATER STERILE IRR 1000ML POUR (IV SOLUTION) IMPLANT

## 2013-05-28 NOTE — Anesthesia Preprocedure Evaluation (Signed)
Anesthesia Evaluation  Patient identified by MRN, date of birth, ID band Patient awake    Reviewed: Allergy & Precautions, H&P , NPO status , Patient's Chart, lab work & pertinent test results  Airway Mallampati: II TM Distance: >3 FB Neck ROM: Full    Dental   Pulmonary  breath sounds clear to auscultation        Cardiovascular hypertension, Rhythm:Regular Rate:Normal     Neuro/Psych  Headaches,    GI/Hepatic GERD-  ,  Endo/Other    Renal/GU      Musculoskeletal   Abdominal (+) + obese,   Peds  Hematology   Anesthesia Other Findings   Reproductive/Obstetrics                           Anesthesia Physical Anesthesia Plan  ASA: II  Anesthesia Plan: General   Post-op Pain Management:    Induction: Intravenous  Airway Management Planned: Oral ETT  Additional Equipment:   Intra-op Plan:   Post-operative Plan: Extubation in OR  Informed Consent: I have reviewed the patients History and Physical, chart, labs and discussed the procedure including the risks, benefits and alternatives for the proposed anesthesia with the patient or authorized representative who has indicated his/her understanding and acceptance.     Plan Discussed with: CRNA and Surgeon  Anesthesia Plan Comments:         Anesthesia Quick Evaluation

## 2013-05-28 NOTE — H&P (View-Only) (Signed)
Heather Olson is an 79 y.o. female.   Chief Complaint: Abdominal pain related to large gallstone. HPI: The patient was recently seen in the [facility] emergency department with abdominal pain ultrasound done at that time demonstrated a large gallstone obstructing the neck of the gallbladder. She was sent for surgical consultation.  Past Medical History  Diagnosis Date  . Hypertension   . GERD (gastroesophageal reflux disease)   . Swelling of both ankles   . Arthritis     Past Surgical History  Procedure Laterality Date  . Abdominal hysterectomy      Family History  Problem Relation Age of Onset  . Diabetes Mother    Social History:  reports that she has never smoked. She has never used smokeless tobacco. She reports that she does not drink alcohol or use illicit drugs.  Allergies: No Known Allergies   (Not in a hospital admission)  No results found for this or any previous visit (from the past 48 hour(s)). No results found.  Review of Systems  Constitutional: Negative for fever and chills.  Gastrointestinal: Positive for abdominal pain and diarrhea.  Musculoskeletal: Positive for myalgias (hx of polymyositis).  All other systems reviewed and are negative.    Blood pressure 138/80, pulse 68, temperature 98.6 F (37 C), temperature source Temporal, resp. rate 14, height 5' 4.25" (1.632 m), weight 206 lb (93.441 kg). Physical Exam  Constitutional: She is oriented to person, place, and time. She appears well-developed and well-nourished.  Overweight  HENT:  Head: Normocephalic and atraumatic.  Eyes: Conjunctivae and EOM are normal. Pupils are equal, round, and reactive to light.  Neck: Normal range of motion. Neck supple.  Cardiovascular: Normal rate, regular rhythm, normal heart sounds and intact distal pulses.   Respiratory: Effort normal and breath sounds normal.  GI: Soft. Normal appearance and bowel sounds are normal. There is tenderness in the right upper quadrant.  There is no rigidity, no guarding, no tenderness at McBurney's point and negative Murphy's sign.    Musculoskeletal: Normal range of motion.  Neurological: She is alert and oriented to person, place, and time. She has normal reflexes.  Skin: Skin is warm and dry.  Psychiatric: She has a normal mood and affect. Her behavior is normal. Judgment and thought content normal.     Assessment/Plan Symptomatic cholelithiasis  Needs cholecystectomy with IOC   Will look to next week for surgery.  Brenan Modesto O 05/11/2013, 11:54 AM    

## 2013-05-28 NOTE — Progress Notes (Signed)
DR WYATT NOTIFIED OF CLIENT C/O SHORTNESS OF BREATH AFTER SURGERY AND STATES STILL SHORT OF BREATH BUT STATES IT IS SOME BETTER NOW AND NOTIFIED OF SOME BLEEDING AT UMBILICUS AND PER DR WYATT WILL CHANGE DRESSING AT UMBILICUS WITH 4X4 AND GET PORTABLE UPRIGHT CXR

## 2013-05-28 NOTE — Preoperative (Addendum)
Beta Blockers   Reason not to administer Beta Blockers:Not Applicable 

## 2013-05-28 NOTE — Progress Notes (Signed)
Called for post op bed in [facility]

## 2013-05-28 NOTE — Anesthesia Procedure Notes (Signed)
Procedure Name: Intubation Date/Time: 05/28/2013 7:59 AM Performed by: Jerilee Hoh Pre-anesthesia Checklist: Patient identified, Emergency Drugs available, Suction available and Patient being monitored Patient Re-evaluated:Patient Re-evaluated prior to inductionOxygen Delivery Method: Circle system utilized Preoxygenation: Pre-oxygenation with 100% oxygen Intubation Type: IV induction Ventilation: Mask ventilation without difficulty Laryngoscope Size: Mac and 3 Grade View: Grade I Tube type: Oral Tube size: 7.5 mm Number of attempts: 1 Airway Equipment and Method: Stylet Placement Confirmation: ETT inserted through vocal cords under direct vision,  positive ETCO2 and breath sounds checked- equal and bilateral Secured at: 21 cm Tube secured with: Tape Dental Injury: Teeth and Oropharynx as per pre-operative assessment

## 2013-05-28 NOTE — Op Note (Signed)
OPERATIVE REPORT  DATE OF OPERATION: 05/28/2013  PATIENT:  Heather Olson  79 y.o. female  PRE-OPERATIVE DIAGNOSIS:  Symptomatic gallstones  POST-OPERATIVE DIAGNOSIS:  Symptomatic gallstones  PROCEDURE:  Procedure(s): LAPAROSCOPIC CHOLECYSTECTOMY   SURGEON:  Surgeon(s): Cherylynn Ridges, MD  ASSISTANT: None  ANESTHESIA:   general  EBL: <20 ml  BLOOD ADMINISTERED: none  DRAINS: none   SPECIMEN:  Source of Specimen:  Gallbladder with stone  COUNTS CORRECT:  YES  PROCEDURE DETAILS: The patient was taken to the operating room and placed on the table in the supine position.  After an adequate endotracheal anesthetic was administered, she was prepped with ChloroPrep, and then draped in the usual manner exposing the entire abdomen laterally, inferiorly and up  to the costal margins.  After a proper timeout was performed including identifying the patient and the procedure to be performed, a supraumbilical 1.5cm midline incision was made using a #15 blade.  This was taken down to the fascia which was then incised with a #15 blade.  The edges of the fascia were tented up with Kocher clamps as the preperitoneal space was penetrated with a Kelly clamp into the peritoneum.  Once this was done, a pursestring suture of 0 Vicryl was passed around the fascial opening.  This was subsequently used to secure the [facility] cannula which was passed into the peritoneal cavity.  Once the [facility] cannula was in place, carbon dioxide gas was insufflated into the peritoneal cavity up to a maximal intra-abdominal pressure of 15mm Hg.The laparoscope, with attached camera and light source, was passed into the peritoneal cavity to visualize the direct insertion of two right upper quadrant 5mm cannulas, and a sup-xiphoid 5mm cannula.  Once all cannulas were in place, the dissection was begun.  Two ratcheted graspers were attached to the dome and infundibulum of the gallbladder and retracted towards the anterior  abdominal wall and the right upper quadrant.  Using cautery attached to a dissecting forceps, the peritoneum overlaying the triangle of Chalot and the hepatoduodenal triangle was dissected away exposing the cystic duct and the cystic artery.  A clip was placed on the gallbladder side of the cystic duct, and the distal cystic duct was clipped multiple times then transected.  The gallbladder was then dissected out of the hepatic bed without event.  It was retrieved from the abdomen (using an EndoCatch bag) without event.  Once the gallbladder was removed, the bed was inspected for hemostasis.  Once excellent hemostasis was obtained all gas and fluids were aspirated from above the liver, then the cannulas were removed.  The supraumbilical incision was closed using the pursestring suture which was in place.  0.25% bupivicaine with epinephrine was injected at all sites.  All 10mm or greater cannula sites were close using a running subcuticular stitch of 4-0 Monocryl.  5.61mm cannula sites were closed with Dermabond only.Steri-Strips and Tagaderm were used to complete the dressings at all sites.  At this point all needle, sponge, and instrument counts were correct.The patient was awakened from anesthesia and taken to the PACU in stable condition.  PATIENT DISPOSITION:  PACU - hemodynamically stable.   Jimmye Norman O 10/31/20148:44 AM

## 2013-05-28 NOTE — Progress Notes (Signed)
STATES FEELS LIKE GOING HOME NOW; NO C/O SHORTNESS OF BREATH OR DIZZINESS

## 2013-05-28 NOTE — Transfer of Care (Signed)
Immediate Anesthesia Transfer of Care Note  Patient: Heather Olson  Procedure(s) Performed: Procedure(s): LAPAROSCOPIC CHOLECYSTECTOMY  (N/A)  Patient Location: PACU  Anesthesia Type:General  Level of Consciousness: awake, alert  and oriented  Airway & Oxygen Therapy: Patient Spontanous Breathing and Patient connected to nasal cannula oxygen  Post-op Assessment: Report given to PACU RN, Post -op Vital signs reviewed and stable and Patient moving all extremities  Post vital signs: Reviewed and stable  Complications: No apparent anesthesia complications

## 2013-05-28 NOTE — Interval H&P Note (Signed)
History and Physical Interval Note:  05/28/2013 7:43 AM  Heather Olson  has presented today for surgery, with the diagnosis of Symptomatic gallstones  The various methods of treatment have been discussed with the patient and family. After consideration of risks, benefits and other options for treatment, the patient has consented to  Procedure(s): LAPAROSCOPIC CHOLECYSTECTOMY WITH INTRAOPERATIVE CHOLANGIOGRAM (N/A) as a surgical intervention .  The patient's history has been reviewed, patient examined, no change in status, stable for surgery.  I have reviewed the patient's chart and labs.  Questions were answered to the patient's satisfaction.     Bush Murdoch, Marta Lamas

## 2013-05-28 NOTE — Progress Notes (Signed)
DR WYATT NOTIFIED OF CXR RESULTS AND NO NEW ORDERS NOTED

## 2013-05-28 NOTE — Anesthesia Postprocedure Evaluation (Signed)
  Anesthesia Post-op Note  Patient: Heather Olson  Procedure(s) Performed: Procedure(s): LAPAROSCOPIC CHOLECYSTECTOMY  (N/A)  Patient Location: PACU  Anesthesia Type:General  Level of Consciousness: awake and alert   Airway and Oxygen Therapy: Patient Spontanous Breathing  Post-op Pain: mild  Post-op Assessment: Post-op Vital signs reviewed, Patient's Cardiovascular Status Stable, Respiratory Function Stable, Patent Airway, No signs of Nausea or vomiting and Pain level controlled  Post-op Vital Signs: Reviewed and stable  Complications: No apparent anesthesia complications

## 2013-05-28 NOTE — Progress Notes (Signed)
DR Lindie Spruce NOTIFIED CLIENT STATES CANNOT TAKE VICODIN

## 2013-05-28 NOTE — Progress Notes (Signed)
Up to bathroom and c/o dizziness and states does not feel like going home at present

## 2013-05-28 NOTE — Progress Notes (Signed)
O2 SAT 89 ON ROOM AND O2 STARTED AT 2L/MIN AND DR Lindie Spruce NOTIFIED

## 2013-06-01 ENCOUNTER — Encounter (HOSPITAL_COMMUNITY): Payer: Self-pay | Admitting: General Surgery

## 2013-06-04 ENCOUNTER — Telehealth (INDEPENDENT_AMBULATORY_CARE_PROVIDER_SITE_OTHER): Payer: Self-pay | Admitting: *Deleted

## 2013-06-04 MED ORDER — CALCIUM & MAGNESIUM CARBONATES 311-232 MG PO TABS: 1.0000 | ORAL_TABLET | Freq: Two times a day (BID) | ORAL | Status: DC

## 2013-06-04 NOTE — Telephone Encounter (Signed)
Error

## 2013-06-04 NOTE — Telephone Encounter (Signed)
Patient's son called stating that patient is having "severe esophageal pain".  Son states this is keeping her up at night and he is very concerned.  Son denies that patient is having difficulty with breathing.  Spoke to Dr. Lindie Spruce who is calling patient at this time.

## 2013-06-10 DIAGNOSIS — I6350 Cerebral infarction due to unspecified occlusion or stenosis of unspecified cerebral artery: Secondary | ICD-10-CM | POA: Diagnosis not present

## 2013-06-10 DIAGNOSIS — Z6836 Body mass index (BMI) 36.0-36.9, adult: Secondary | ICD-10-CM | POA: Diagnosis not present

## 2013-06-10 DIAGNOSIS — E785 Hyperlipidemia, unspecified: Secondary | ICD-10-CM | POA: Diagnosis not present

## 2013-06-10 DIAGNOSIS — I10 Essential (primary) hypertension: Secondary | ICD-10-CM | POA: Diagnosis not present

## 2013-06-10 DIAGNOSIS — J9819 Other pulmonary collapse: Secondary | ICD-10-CM | POA: Diagnosis not present

## 2013-06-10 DIAGNOSIS — R079 Chest pain, unspecified: Secondary | ICD-10-CM | POA: Diagnosis not present

## 2013-06-10 DIAGNOSIS — S298XXA Other specified injuries of thorax, initial encounter: Secondary | ICD-10-CM | POA: Diagnosis not present

## 2013-06-14 ENCOUNTER — Encounter (HOSPITAL_COMMUNITY): Payer: Self-pay | Admitting: General Surgery

## 2013-06-15 ENCOUNTER — Encounter (INDEPENDENT_AMBULATORY_CARE_PROVIDER_SITE_OTHER): Payer: Self-pay | Admitting: General Surgery

## 2013-06-15 ENCOUNTER — Ambulatory Visit (INDEPENDENT_AMBULATORY_CARE_PROVIDER_SITE_OTHER): Payer: Medicare Other | Admitting: General Surgery

## 2013-06-15 VITALS — BP 140/86 | HR 80 | Temp 97.8°F | Resp 15 | Ht 64.25 in | Wt 209.8 lb

## 2013-06-15 DIAGNOSIS — M159 Polyosteoarthritis, unspecified: Secondary | ICD-10-CM | POA: Diagnosis not present

## 2013-06-15 DIAGNOSIS — Z09 Encounter for follow-up examination after completed treatment for conditions other than malignant neoplasm: Secondary | ICD-10-CM | POA: Insufficient documentation

## 2013-06-15 DIAGNOSIS — IMO0002 Reserved for concepts with insufficient information to code with codable children: Secondary | ICD-10-CM | POA: Diagnosis not present

## 2013-06-15 DIAGNOSIS — N189 Chronic kidney disease, unspecified: Secondary | ICD-10-CM | POA: Diagnosis not present

## 2013-06-15 DIAGNOSIS — M3320 Polymyositis, organ involvement unspecified: Secondary | ICD-10-CM | POA: Diagnosis not present

## 2013-06-15 NOTE — Progress Notes (Signed)
The patient is doing well from a surgical standpoint. Her midline and all the wounds are doing well. The patient fell approximately one week ago and had some left-sided chest pain which is resolving with the use of tramadol.  The patient is somewhat constipated and it was recommended that she take stool softeners and fiber. Otherwise she's doing well and will return to see me on an as-needed basis.

## 2013-07-14 DIAGNOSIS — M3320 Polymyositis, organ involvement unspecified: Secondary | ICD-10-CM | POA: Diagnosis not present

## 2013-08-09 DIAGNOSIS — Z6834 Body mass index (BMI) 34.0-34.9, adult: Secondary | ICD-10-CM | POA: Diagnosis not present

## 2013-08-09 DIAGNOSIS — R51 Headache: Secondary | ICD-10-CM | POA: Diagnosis not present

## 2013-08-09 DIAGNOSIS — J069 Acute upper respiratory infection, unspecified: Secondary | ICD-10-CM | POA: Diagnosis not present

## 2013-09-01 DIAGNOSIS — M3320 Polymyositis, organ involvement unspecified: Secondary | ICD-10-CM | POA: Diagnosis not present

## 2013-09-01 DIAGNOSIS — M159 Polyosteoarthritis, unspecified: Secondary | ICD-10-CM | POA: Diagnosis not present

## 2013-09-01 DIAGNOSIS — IMO0002 Reserved for concepts with insufficient information to code with codable children: Secondary | ICD-10-CM | POA: Diagnosis not present

## 2013-09-01 DIAGNOSIS — N189 Chronic kidney disease, unspecified: Secondary | ICD-10-CM | POA: Diagnosis not present

## 2013-09-22 DIAGNOSIS — J310 Chronic rhinitis: Secondary | ICD-10-CM | POA: Diagnosis not present

## 2013-10-01 DIAGNOSIS — H2510 Age-related nuclear cataract, unspecified eye: Secondary | ICD-10-CM | POA: Diagnosis not present

## 2013-10-01 DIAGNOSIS — H25019 Cortical age-related cataract, unspecified eye: Secondary | ICD-10-CM | POA: Diagnosis not present

## 2013-10-05 DIAGNOSIS — I10 Essential (primary) hypertension: Secondary | ICD-10-CM | POA: Diagnosis not present

## 2013-10-05 DIAGNOSIS — R059 Cough, unspecified: Secondary | ICD-10-CM | POA: Diagnosis not present

## 2013-10-05 DIAGNOSIS — R05 Cough: Secondary | ICD-10-CM | POA: Diagnosis not present

## 2013-10-05 DIAGNOSIS — Z6835 Body mass index (BMI) 35.0-35.9, adult: Secondary | ICD-10-CM | POA: Diagnosis not present

## 2013-10-13 DIAGNOSIS — Z01419 Encounter for gynecological examination (general) (routine) without abnormal findings: Secondary | ICD-10-CM | POA: Diagnosis not present

## 2013-10-13 DIAGNOSIS — Z124 Encounter for screening for malignant neoplasm of cervix: Secondary | ICD-10-CM | POA: Diagnosis not present

## 2013-10-25 DIAGNOSIS — Z1231 Encounter for screening mammogram for malignant neoplasm of breast: Secondary | ICD-10-CM | POA: Diagnosis not present

## 2013-11-05 DIAGNOSIS — IMO0002 Reserved for concepts with insufficient information to code with codable children: Secondary | ICD-10-CM | POA: Diagnosis not present

## 2013-11-05 DIAGNOSIS — N189 Chronic kidney disease, unspecified: Secondary | ICD-10-CM | POA: Diagnosis not present

## 2013-11-05 DIAGNOSIS — M3320 Polymyositis, organ involvement unspecified: Secondary | ICD-10-CM | POA: Diagnosis not present

## 2013-11-05 DIAGNOSIS — M159 Polyosteoarthritis, unspecified: Secondary | ICD-10-CM | POA: Diagnosis not present

## 2013-11-10 DIAGNOSIS — H25049 Posterior subcapsular polar age-related cataract, unspecified eye: Secondary | ICD-10-CM | POA: Diagnosis not present

## 2013-11-10 DIAGNOSIS — H2589 Other age-related cataract: Secondary | ICD-10-CM | POA: Diagnosis not present

## 2013-11-10 DIAGNOSIS — H25019 Cortical age-related cataract, unspecified eye: Secondary | ICD-10-CM | POA: Diagnosis not present

## 2013-11-10 DIAGNOSIS — H2510 Age-related nuclear cataract, unspecified eye: Secondary | ICD-10-CM | POA: Diagnosis not present

## 2014-01-11 DIAGNOSIS — M25519 Pain in unspecified shoulder: Secondary | ICD-10-CM | POA: Diagnosis not present

## 2014-01-11 DIAGNOSIS — R05 Cough: Secondary | ICD-10-CM | POA: Diagnosis not present

## 2014-01-11 DIAGNOSIS — Z6834 Body mass index (BMI) 34.0-34.9, adult: Secondary | ICD-10-CM | POA: Diagnosis not present

## 2014-01-11 DIAGNOSIS — R059 Cough, unspecified: Secondary | ICD-10-CM | POA: Diagnosis not present

## 2014-01-14 DIAGNOSIS — H16209 Unspecified keratoconjunctivitis, unspecified eye: Secondary | ICD-10-CM | POA: Diagnosis not present

## 2014-01-17 DIAGNOSIS — M25519 Pain in unspecified shoulder: Secondary | ICD-10-CM | POA: Diagnosis not present

## 2014-02-07 DIAGNOSIS — M67919 Unspecified disorder of synovium and tendon, unspecified shoulder: Secondary | ICD-10-CM | POA: Diagnosis not present

## 2014-02-07 DIAGNOSIS — M25519 Pain in unspecified shoulder: Secondary | ICD-10-CM | POA: Diagnosis not present

## 2014-02-14 DIAGNOSIS — M159 Polyosteoarthritis, unspecified: Secondary | ICD-10-CM | POA: Diagnosis not present

## 2014-02-14 DIAGNOSIS — N189 Chronic kidney disease, unspecified: Secondary | ICD-10-CM | POA: Diagnosis not present

## 2014-02-14 DIAGNOSIS — M3320 Polymyositis, organ involvement unspecified: Secondary | ICD-10-CM | POA: Diagnosis not present

## 2014-02-14 DIAGNOSIS — IMO0002 Reserved for concepts with insufficient information to code with codable children: Secondary | ICD-10-CM | POA: Diagnosis not present

## 2014-02-15 ENCOUNTER — Other Ambulatory Visit: Payer: Self-pay | Admitting: Orthopedic Surgery

## 2014-02-15 DIAGNOSIS — M25512 Pain in left shoulder: Secondary | ICD-10-CM

## 2014-02-15 DIAGNOSIS — M25569 Pain in unspecified knee: Secondary | ICD-10-CM | POA: Diagnosis not present

## 2014-02-15 DIAGNOSIS — M25511 Pain in right shoulder: Secondary | ICD-10-CM

## 2014-02-20 ENCOUNTER — Other Ambulatory Visit: Payer: Medicare Other

## 2014-02-20 ENCOUNTER — Ambulatory Visit
Admission: RE | Admit: 2014-02-20 | Discharge: 2014-02-20 | Disposition: A | Discharge status: Home / Self Care | Payer: Medicare Other | Source: Ambulatory Visit | Attending: Orthopedic Surgery | Admitting: Orthopedic Surgery

## 2014-02-20 DIAGNOSIS — M25512 Pain in left shoulder: Secondary | ICD-10-CM

## 2014-02-20 DIAGNOSIS — M67919 Unspecified disorder of synovium and tendon, unspecified shoulder: Secondary | ICD-10-CM | POA: Diagnosis not present

## 2014-02-20 DIAGNOSIS — M19019 Primary osteoarthritis, unspecified shoulder: Secondary | ICD-10-CM | POA: Diagnosis not present

## 2014-02-24 DIAGNOSIS — M25569 Pain in unspecified knee: Secondary | ICD-10-CM | POA: Diagnosis not present

## 2014-03-03 DIAGNOSIS — M6281 Muscle weakness (generalized): Secondary | ICD-10-CM | POA: Diagnosis not present

## 2014-03-03 DIAGNOSIS — M25519 Pain in unspecified shoulder: Secondary | ICD-10-CM | POA: Diagnosis not present

## 2014-03-08 ENCOUNTER — Ambulatory Visit (INDEPENDENT_AMBULATORY_CARE_PROVIDER_SITE_OTHER): Payer: Medicare Other | Admitting: Podiatry

## 2014-03-08 ENCOUNTER — Ambulatory Visit: Payer: Self-pay | Admitting: Podiatry

## 2014-03-08 ENCOUNTER — Encounter: Payer: Self-pay | Admitting: Podiatry

## 2014-03-08 ENCOUNTER — Ambulatory Visit (INDEPENDENT_AMBULATORY_CARE_PROVIDER_SITE_OTHER): Payer: Medicare Other

## 2014-03-08 DIAGNOSIS — M2141 Flat foot [pes planus] (acquired), right foot: Secondary | ICD-10-CM

## 2014-03-08 DIAGNOSIS — M2142 Flat foot [pes planus] (acquired), left foot: Principal | ICD-10-CM

## 2014-03-08 DIAGNOSIS — M2140 Flat foot [pes planus] (acquired), unspecified foot: Secondary | ICD-10-CM | POA: Diagnosis not present

## 2014-03-08 DIAGNOSIS — M6789 Other specified disorders of synovium and tendon, multiple sites: Secondary | ICD-10-CM

## 2014-03-08 DIAGNOSIS — M76829 Posterior tibial tendinitis, unspecified leg: Secondary | ICD-10-CM

## 2014-03-08 NOTE — Progress Notes (Signed)
   Subjective:    Patient ID: Heather Olson, female    DOB: 08-15-1932, 80 y.o.   MRN: [id number]  HPI Comments: "I have a fallen arch"  Patient c/o aching arch and medial foot and ankle left for about 2 months. She has a lot of swelling. She has tried OTC arch supports, no help.  Foot Pain      Review of Systems  Musculoskeletal: Positive for gait problem.  All other systems reviewed and are negative.      Objective:   Physical Exam: I have reviewed her past medical history medications allergies surgeries social history and review of systems. Pulses are strongly palpable bilateral. Neurologic sensorium is intact per since once the monofilament. Deep tendon reflexes are intact bilateral muscle strength is 5 over 5 dorsiflexors plantar flexors inverters everters all his musculature is intact with exception of the inverters specifically the posterior tibial tendon of the left foot. It appears that she has posterior tibial tendon dysfunction or PTT D. orthopedic evaluation demonstrates all joints distal to the ankle a full range of motion without crepitation. With exception of the first metatarsophalangeal joint of the left foot which is extended area to previous surgery. She also has severe pes planus with abduction of the midfoot of the rear foot left. Radiographic evaluation confirms this. Cutaneous evaluation demonstrates supple well hydrated cutis no erythema edema cellulitis drainage or odor with exception of the edema around the ankle and the posterior tibial tendon.        Assessment & Plan:  Assessment: Posterior tibial tendon dysfunction left foot. Severe pes planus left foot.  Plan: Discussed etiology pathology conservative versus surgical therapies. At this point we are getting her scheduled for a [place] brace to be casted. I will followup with her once I comes in.

## 2014-03-09 DIAGNOSIS — M25519 Pain in unspecified shoulder: Secondary | ICD-10-CM | POA: Diagnosis not present

## 2014-03-09 DIAGNOSIS — M6281 Muscle weakness (generalized): Secondary | ICD-10-CM | POA: Diagnosis not present

## 2014-03-14 DIAGNOSIS — M25519 Pain in unspecified shoulder: Secondary | ICD-10-CM | POA: Diagnosis not present

## 2014-03-14 DIAGNOSIS — M6281 Muscle weakness (generalized): Secondary | ICD-10-CM | POA: Diagnosis not present

## 2014-03-17 ENCOUNTER — Other Ambulatory Visit: Payer: Medicare Other

## 2014-03-18 DIAGNOSIS — E042 Nontoxic multinodular goiter: Secondary | ICD-10-CM | POA: Diagnosis not present

## 2014-03-18 DIAGNOSIS — E559 Vitamin D deficiency, unspecified: Secondary | ICD-10-CM | POA: Diagnosis not present

## 2014-03-18 DIAGNOSIS — I10 Essential (primary) hypertension: Secondary | ICD-10-CM | POA: Diagnosis not present

## 2014-03-18 DIAGNOSIS — E785 Hyperlipidemia, unspecified: Secondary | ICD-10-CM | POA: Diagnosis not present

## 2014-03-21 DIAGNOSIS — M25519 Pain in unspecified shoulder: Secondary | ICD-10-CM | POA: Diagnosis not present

## 2014-03-21 DIAGNOSIS — M6281 Muscle weakness (generalized): Secondary | ICD-10-CM | POA: Diagnosis not present

## 2014-03-23 DIAGNOSIS — M6281 Muscle weakness (generalized): Secondary | ICD-10-CM | POA: Diagnosis not present

## 2014-03-23 DIAGNOSIS — M25519 Pain in unspecified shoulder: Secondary | ICD-10-CM | POA: Diagnosis not present

## 2014-03-24 ENCOUNTER — Ambulatory Visit: Payer: Self-pay | Admitting: Podiatry

## 2014-03-24 DIAGNOSIS — M25569 Pain in unspecified knee: Secondary | ICD-10-CM | POA: Diagnosis not present

## 2014-03-28 ENCOUNTER — Other Ambulatory Visit: Payer: Medicare Other

## 2014-03-28 ENCOUNTER — Other Ambulatory Visit: Payer: Medicare Other | Admitting: *Deleted

## 2014-03-28 DIAGNOSIS — M6281 Muscle weakness (generalized): Secondary | ICD-10-CM | POA: Diagnosis not present

## 2014-03-28 DIAGNOSIS — M25519 Pain in unspecified shoulder: Secondary | ICD-10-CM | POA: Diagnosis not present

## 2014-03-28 DIAGNOSIS — M3320 Polymyositis, organ involvement unspecified: Secondary | ICD-10-CM | POA: Diagnosis not present

## 2014-04-01 DIAGNOSIS — M1710 Unilateral primary osteoarthritis, unspecified knee: Secondary | ICD-10-CM | POA: Diagnosis not present

## 2014-04-07 DIAGNOSIS — M1710 Unilateral primary osteoarthritis, unspecified knee: Secondary | ICD-10-CM | POA: Diagnosis not present

## 2014-04-12 DIAGNOSIS — H3500 Unspecified background retinopathy: Secondary | ICD-10-CM | POA: Diagnosis not present

## 2014-04-12 DIAGNOSIS — H25019 Cortical age-related cataract, unspecified eye: Secondary | ICD-10-CM | POA: Diagnosis not present

## 2014-04-12 DIAGNOSIS — H25049 Posterior subcapsular polar age-related cataract, unspecified eye: Secondary | ICD-10-CM | POA: Diagnosis not present

## 2014-04-12 DIAGNOSIS — H2510 Age-related nuclear cataract, unspecified eye: Secondary | ICD-10-CM | POA: Diagnosis not present

## 2014-04-14 DIAGNOSIS — M3320 Polymyositis, organ involvement unspecified: Secondary | ICD-10-CM | POA: Diagnosis not present

## 2014-04-14 DIAGNOSIS — N189 Chronic kidney disease, unspecified: Secondary | ICD-10-CM | POA: Diagnosis not present

## 2014-04-14 DIAGNOSIS — IMO0002 Reserved for concepts with insufficient information to code with codable children: Secondary | ICD-10-CM | POA: Diagnosis not present

## 2014-04-14 DIAGNOSIS — M159 Polyosteoarthritis, unspecified: Secondary | ICD-10-CM | POA: Diagnosis not present

## 2014-04-18 DIAGNOSIS — Z1212 Encounter for screening for malignant neoplasm of rectum: Secondary | ICD-10-CM | POA: Diagnosis not present

## 2014-04-20 DIAGNOSIS — M25819 Other specified joint disorders, unspecified shoulder: Secondary | ICD-10-CM | POA: Diagnosis not present

## 2014-04-20 DIAGNOSIS — M75120 Complete rotator cuff tear or rupture of unspecified shoulder, not specified as traumatic: Secondary | ICD-10-CM | POA: Diagnosis not present

## 2014-04-20 DIAGNOSIS — M719 Bursopathy, unspecified: Secondary | ICD-10-CM | POA: Diagnosis not present

## 2014-04-20 DIAGNOSIS — M24119 Other articular cartilage disorders, unspecified shoulder: Secondary | ICD-10-CM | POA: Diagnosis not present

## 2014-04-20 DIAGNOSIS — M67919 Unspecified disorder of synovium and tendon, unspecified shoulder: Secondary | ICD-10-CM | POA: Diagnosis not present

## 2014-04-20 DIAGNOSIS — G8918 Other acute postprocedural pain: Secondary | ICD-10-CM | POA: Diagnosis not present

## 2014-04-20 DIAGNOSIS — M19019 Primary osteoarthritis, unspecified shoulder: Secondary | ICD-10-CM | POA: Diagnosis not present

## 2014-05-06 DIAGNOSIS — M25612 Stiffness of left shoulder, not elsewhere classified: Secondary | ICD-10-CM | POA: Diagnosis not present

## 2014-05-06 DIAGNOSIS — M25512 Pain in left shoulder: Secondary | ICD-10-CM | POA: Diagnosis not present

## 2014-05-06 DIAGNOSIS — M6281 Muscle weakness (generalized): Secondary | ICD-10-CM | POA: Diagnosis not present

## 2014-05-09 DIAGNOSIS — M25512 Pain in left shoulder: Secondary | ICD-10-CM | POA: Diagnosis not present

## 2014-05-09 DIAGNOSIS — M6281 Muscle weakness (generalized): Secondary | ICD-10-CM | POA: Diagnosis not present

## 2014-05-09 DIAGNOSIS — M25612 Stiffness of left shoulder, not elsewhere classified: Secondary | ICD-10-CM | POA: Diagnosis not present

## 2014-05-11 DIAGNOSIS — M25612 Stiffness of left shoulder, not elsewhere classified: Secondary | ICD-10-CM | POA: Diagnosis not present

## 2014-05-11 DIAGNOSIS — M6281 Muscle weakness (generalized): Secondary | ICD-10-CM | POA: Diagnosis not present

## 2014-05-11 DIAGNOSIS — M25512 Pain in left shoulder: Secondary | ICD-10-CM | POA: Diagnosis not present

## 2014-05-13 DIAGNOSIS — M25612 Stiffness of left shoulder, not elsewhere classified: Secondary | ICD-10-CM | POA: Diagnosis not present

## 2014-05-13 DIAGNOSIS — M25512 Pain in left shoulder: Secondary | ICD-10-CM | POA: Diagnosis not present

## 2014-05-13 DIAGNOSIS — M6281 Muscle weakness (generalized): Secondary | ICD-10-CM | POA: Diagnosis not present

## 2014-05-16 DIAGNOSIS — M25512 Pain in left shoulder: Secondary | ICD-10-CM | POA: Diagnosis not present

## 2014-05-16 DIAGNOSIS — M6281 Muscle weakness (generalized): Secondary | ICD-10-CM | POA: Diagnosis not present

## 2014-05-16 DIAGNOSIS — M25612 Stiffness of left shoulder, not elsewhere classified: Secondary | ICD-10-CM | POA: Diagnosis not present

## 2014-05-17 ENCOUNTER — Ambulatory Visit (INDEPENDENT_AMBULATORY_CARE_PROVIDER_SITE_OTHER): Payer: Medicare Other | Admitting: Podiatry

## 2014-05-17 VITALS — BP 159/83 | HR 84 | Resp 17

## 2014-05-17 DIAGNOSIS — M76829 Posterior tibial tendinitis, unspecified leg: Secondary | ICD-10-CM

## 2014-05-17 DIAGNOSIS — M2141 Flat foot [pes planus] (acquired), right foot: Secondary | ICD-10-CM | POA: Diagnosis not present

## 2014-05-17 DIAGNOSIS — M6789 Other specified disorders of synovium and tendon, multiple sites: Secondary | ICD-10-CM

## 2014-05-17 DIAGNOSIS — M2142 Flat foot [pes planus] (acquired), left foot: Secondary | ICD-10-CM

## 2014-05-17 NOTE — Progress Notes (Signed)
   Subjective:    Patient ID: Heather Olson, female    DOB: 10-28-1932, 80 y.o.   MRN: [id number]  HPI Comments: Pt is fitted with the brace to support the left arch and ankle, and given printed instruction on the care and wearing of the brace.  Pt states she can feel the support of the brace in her ankle.     Review of Systems     Objective:   Physical Exam: Previous evaluation demonstrates posterior tibial tendon dysfunction of the left foot. Severe posterior tibial tendinitis and pes planus.        Assessment & Plan:  Assessment: Pes planus posterior tibial tendon dysfunction left.  Plan: Her [place] brace was dispensed today and I will followup with her in one month.

## 2014-05-18 DIAGNOSIS — M25512 Pain in left shoulder: Secondary | ICD-10-CM | POA: Diagnosis not present

## 2014-05-18 DIAGNOSIS — M6281 Muscle weakness (generalized): Secondary | ICD-10-CM | POA: Diagnosis not present

## 2014-05-18 DIAGNOSIS — M25612 Stiffness of left shoulder, not elsewhere classified: Secondary | ICD-10-CM | POA: Diagnosis not present

## 2014-05-19 DIAGNOSIS — Z23 Encounter for immunization: Secondary | ICD-10-CM | POA: Diagnosis not present

## 2014-05-20 DIAGNOSIS — M25512 Pain in left shoulder: Secondary | ICD-10-CM | POA: Diagnosis not present

## 2014-05-20 DIAGNOSIS — M25612 Stiffness of left shoulder, not elsewhere classified: Secondary | ICD-10-CM | POA: Diagnosis not present

## 2014-05-20 DIAGNOSIS — M6281 Muscle weakness (generalized): Secondary | ICD-10-CM | POA: Diagnosis not present

## 2014-05-25 DIAGNOSIS — M25512 Pain in left shoulder: Secondary | ICD-10-CM | POA: Diagnosis not present

## 2014-05-25 DIAGNOSIS — M25612 Stiffness of left shoulder, not elsewhere classified: Secondary | ICD-10-CM | POA: Diagnosis not present

## 2014-05-25 DIAGNOSIS — M6281 Muscle weakness (generalized): Secondary | ICD-10-CM | POA: Diagnosis not present

## 2014-05-30 DIAGNOSIS — M6281 Muscle weakness (generalized): Secondary | ICD-10-CM | POA: Diagnosis not present

## 2014-05-30 DIAGNOSIS — M25612 Stiffness of left shoulder, not elsewhere classified: Secondary | ICD-10-CM | POA: Diagnosis not present

## 2014-05-30 DIAGNOSIS — M25512 Pain in left shoulder: Secondary | ICD-10-CM | POA: Diagnosis not present

## 2014-06-01 DIAGNOSIS — M25512 Pain in left shoulder: Secondary | ICD-10-CM | POA: Diagnosis not present

## 2014-06-01 DIAGNOSIS — M6281 Muscle weakness (generalized): Secondary | ICD-10-CM | POA: Diagnosis not present

## 2014-06-01 DIAGNOSIS — M25612 Stiffness of left shoulder, not elsewhere classified: Secondary | ICD-10-CM | POA: Diagnosis not present

## 2014-06-03 DIAGNOSIS — M6281 Muscle weakness (generalized): Secondary | ICD-10-CM | POA: Diagnosis not present

## 2014-06-03 DIAGNOSIS — M25612 Stiffness of left shoulder, not elsewhere classified: Secondary | ICD-10-CM | POA: Diagnosis not present

## 2014-06-03 DIAGNOSIS — M25512 Pain in left shoulder: Secondary | ICD-10-CM | POA: Diagnosis not present

## 2014-06-06 DIAGNOSIS — M25612 Stiffness of left shoulder, not elsewhere classified: Secondary | ICD-10-CM | POA: Diagnosis not present

## 2014-06-06 DIAGNOSIS — M6281 Muscle weakness (generalized): Secondary | ICD-10-CM | POA: Diagnosis not present

## 2014-06-06 DIAGNOSIS — M25512 Pain in left shoulder: Secondary | ICD-10-CM | POA: Diagnosis not present

## 2014-06-08 DIAGNOSIS — M25612 Stiffness of left shoulder, not elsewhere classified: Secondary | ICD-10-CM | POA: Diagnosis not present

## 2014-06-08 DIAGNOSIS — M6281 Muscle weakness (generalized): Secondary | ICD-10-CM | POA: Diagnosis not present

## 2014-06-08 DIAGNOSIS — M25512 Pain in left shoulder: Secondary | ICD-10-CM | POA: Diagnosis not present

## 2014-06-10 DIAGNOSIS — M6281 Muscle weakness (generalized): Secondary | ICD-10-CM | POA: Diagnosis not present

## 2014-06-10 DIAGNOSIS — M25612 Stiffness of left shoulder, not elsewhere classified: Secondary | ICD-10-CM | POA: Diagnosis not present

## 2014-06-10 DIAGNOSIS — M25512 Pain in left shoulder: Secondary | ICD-10-CM | POA: Diagnosis not present

## 2014-06-13 DIAGNOSIS — M6281 Muscle weakness (generalized): Secondary | ICD-10-CM | POA: Diagnosis not present

## 2014-06-13 DIAGNOSIS — M25612 Stiffness of left shoulder, not elsewhere classified: Secondary | ICD-10-CM | POA: Diagnosis not present

## 2014-06-13 DIAGNOSIS — M25512 Pain in left shoulder: Secondary | ICD-10-CM | POA: Diagnosis not present

## 2014-06-15 DIAGNOSIS — Z23 Encounter for immunization: Secondary | ICD-10-CM | POA: Diagnosis not present

## 2014-06-15 DIAGNOSIS — E042 Nontoxic multinodular goiter: Secondary | ICD-10-CM | POA: Diagnosis not present

## 2014-06-15 DIAGNOSIS — E559 Vitamin D deficiency, unspecified: Secondary | ICD-10-CM | POA: Diagnosis not present

## 2014-06-15 DIAGNOSIS — K219 Gastro-esophageal reflux disease without esophagitis: Secondary | ICD-10-CM | POA: Diagnosis not present

## 2014-06-15 DIAGNOSIS — M25512 Pain in left shoulder: Secondary | ICD-10-CM | POA: Diagnosis not present

## 2014-06-15 DIAGNOSIS — M6281 Muscle weakness (generalized): Secondary | ICD-10-CM | POA: Diagnosis not present

## 2014-06-15 DIAGNOSIS — K2270 Barrett's esophagus without dysplasia: Secondary | ICD-10-CM | POA: Diagnosis not present

## 2014-06-15 DIAGNOSIS — Z0000 Encounter for general adult medical examination without abnormal findings: Secondary | ICD-10-CM | POA: Diagnosis not present

## 2014-06-15 DIAGNOSIS — I10 Essential (primary) hypertension: Secondary | ICD-10-CM | POA: Diagnosis not present

## 2014-06-15 DIAGNOSIS — E785 Hyperlipidemia, unspecified: Secondary | ICD-10-CM | POA: Diagnosis not present

## 2014-06-15 DIAGNOSIS — M25612 Stiffness of left shoulder, not elsewhere classified: Secondary | ICD-10-CM | POA: Diagnosis not present

## 2014-06-15 DIAGNOSIS — H9190 Unspecified hearing loss, unspecified ear: Secondary | ICD-10-CM | POA: Diagnosis not present

## 2014-06-17 DIAGNOSIS — M3320 Polymyositis, organ involvement unspecified: Secondary | ICD-10-CM | POA: Diagnosis not present

## 2014-06-17 DIAGNOSIS — M25612 Stiffness of left shoulder, not elsewhere classified: Secondary | ICD-10-CM | POA: Diagnosis not present

## 2014-06-17 DIAGNOSIS — M25512 Pain in left shoulder: Secondary | ICD-10-CM | POA: Diagnosis not present

## 2014-06-17 DIAGNOSIS — M150 Primary generalized (osteo)arthritis: Secondary | ICD-10-CM | POA: Diagnosis not present

## 2014-06-17 DIAGNOSIS — Z79899 Other long term (current) drug therapy: Secondary | ICD-10-CM | POA: Diagnosis not present

## 2014-06-17 DIAGNOSIS — M6281 Muscle weakness (generalized): Secondary | ICD-10-CM | POA: Diagnosis not present

## 2014-06-17 DIAGNOSIS — N183 Chronic kidney disease, stage 3 (moderate): Secondary | ICD-10-CM | POA: Diagnosis not present

## 2014-06-22 DIAGNOSIS — M6281 Muscle weakness (generalized): Secondary | ICD-10-CM | POA: Diagnosis not present

## 2014-06-22 DIAGNOSIS — M25512 Pain in left shoulder: Secondary | ICD-10-CM | POA: Diagnosis not present

## 2014-06-22 DIAGNOSIS — M25612 Stiffness of left shoulder, not elsewhere classified: Secondary | ICD-10-CM | POA: Diagnosis not present

## 2014-06-27 DIAGNOSIS — M25512 Pain in left shoulder: Secondary | ICD-10-CM | POA: Diagnosis not present

## 2014-06-27 DIAGNOSIS — M6281 Muscle weakness (generalized): Secondary | ICD-10-CM | POA: Diagnosis not present

## 2014-06-27 DIAGNOSIS — M25612 Stiffness of left shoulder, not elsewhere classified: Secondary | ICD-10-CM | POA: Diagnosis not present

## 2014-06-29 DIAGNOSIS — M6281 Muscle weakness (generalized): Secondary | ICD-10-CM | POA: Diagnosis not present

## 2014-06-29 DIAGNOSIS — M25512 Pain in left shoulder: Secondary | ICD-10-CM | POA: Diagnosis not present

## 2014-06-29 DIAGNOSIS — M25612 Stiffness of left shoulder, not elsewhere classified: Secondary | ICD-10-CM | POA: Diagnosis not present

## 2014-07-01 DIAGNOSIS — M25612 Stiffness of left shoulder, not elsewhere classified: Secondary | ICD-10-CM | POA: Diagnosis not present

## 2014-07-01 DIAGNOSIS — M6281 Muscle weakness (generalized): Secondary | ICD-10-CM | POA: Diagnosis not present

## 2014-07-01 DIAGNOSIS — M25512 Pain in left shoulder: Secondary | ICD-10-CM | POA: Diagnosis not present

## 2014-07-04 DIAGNOSIS — M25512 Pain in left shoulder: Secondary | ICD-10-CM | POA: Diagnosis not present

## 2014-07-04 DIAGNOSIS — M6281 Muscle weakness (generalized): Secondary | ICD-10-CM | POA: Diagnosis not present

## 2014-07-04 DIAGNOSIS — M25612 Stiffness of left shoulder, not elsewhere classified: Secondary | ICD-10-CM | POA: Diagnosis not present

## 2014-07-06 DIAGNOSIS — M25512 Pain in left shoulder: Secondary | ICD-10-CM | POA: Diagnosis not present

## 2014-07-06 DIAGNOSIS — M25612 Stiffness of left shoulder, not elsewhere classified: Secondary | ICD-10-CM | POA: Diagnosis not present

## 2014-07-06 DIAGNOSIS — M6281 Muscle weakness (generalized): Secondary | ICD-10-CM | POA: Diagnosis not present

## 2014-07-08 DIAGNOSIS — M25612 Stiffness of left shoulder, not elsewhere classified: Secondary | ICD-10-CM | POA: Diagnosis not present

## 2014-07-08 DIAGNOSIS — M25512 Pain in left shoulder: Secondary | ICD-10-CM | POA: Diagnosis not present

## 2014-07-08 DIAGNOSIS — M6281 Muscle weakness (generalized): Secondary | ICD-10-CM | POA: Diagnosis not present

## 2014-07-13 DIAGNOSIS — M25512 Pain in left shoulder: Secondary | ICD-10-CM | POA: Diagnosis not present

## 2014-07-13 DIAGNOSIS — M6281 Muscle weakness (generalized): Secondary | ICD-10-CM | POA: Diagnosis not present

## 2014-07-13 DIAGNOSIS — M25612 Stiffness of left shoulder, not elsewhere classified: Secondary | ICD-10-CM | POA: Diagnosis not present

## 2014-07-15 DIAGNOSIS — M25612 Stiffness of left shoulder, not elsewhere classified: Secondary | ICD-10-CM | POA: Diagnosis not present

## 2014-07-15 DIAGNOSIS — M25512 Pain in left shoulder: Secondary | ICD-10-CM | POA: Diagnosis not present

## 2014-07-15 DIAGNOSIS — M6281 Muscle weakness (generalized): Secondary | ICD-10-CM | POA: Diagnosis not present

## 2014-07-18 DIAGNOSIS — M25312 Other instability, left shoulder: Secondary | ICD-10-CM | POA: Diagnosis not present

## 2014-07-18 DIAGNOSIS — M25512 Pain in left shoulder: Secondary | ICD-10-CM | POA: Diagnosis not present

## 2014-07-18 DIAGNOSIS — M3320 Polymyositis, organ involvement unspecified: Secondary | ICD-10-CM | POA: Diagnosis not present

## 2014-07-18 DIAGNOSIS — M25612 Stiffness of left shoulder, not elsewhere classified: Secondary | ICD-10-CM | POA: Diagnosis not present

## 2014-07-18 DIAGNOSIS — M6281 Muscle weakness (generalized): Secondary | ICD-10-CM | POA: Diagnosis not present

## 2014-07-20 DIAGNOSIS — M25512 Pain in left shoulder: Secondary | ICD-10-CM | POA: Diagnosis not present

## 2014-07-20 DIAGNOSIS — M25612 Stiffness of left shoulder, not elsewhere classified: Secondary | ICD-10-CM | POA: Diagnosis not present

## 2014-07-20 DIAGNOSIS — M6281 Muscle weakness (generalized): Secondary | ICD-10-CM | POA: Diagnosis not present

## 2014-07-25 DIAGNOSIS — M25612 Stiffness of left shoulder, not elsewhere classified: Secondary | ICD-10-CM | POA: Diagnosis not present

## 2014-07-25 DIAGNOSIS — M6281 Muscle weakness (generalized): Secondary | ICD-10-CM | POA: Diagnosis not present

## 2014-07-25 DIAGNOSIS — M25512 Pain in left shoulder: Secondary | ICD-10-CM | POA: Diagnosis not present

## 2014-07-27 DIAGNOSIS — M6281 Muscle weakness (generalized): Secondary | ICD-10-CM | POA: Diagnosis not present

## 2014-07-27 DIAGNOSIS — M25612 Stiffness of left shoulder, not elsewhere classified: Secondary | ICD-10-CM | POA: Diagnosis not present

## 2014-07-27 DIAGNOSIS — M25512 Pain in left shoulder: Secondary | ICD-10-CM | POA: Diagnosis not present

## 2014-07-28 DIAGNOSIS — M25512 Pain in left shoulder: Secondary | ICD-10-CM | POA: Diagnosis not present

## 2014-08-01 DIAGNOSIS — M25512 Pain in left shoulder: Secondary | ICD-10-CM | POA: Diagnosis not present

## 2014-08-01 DIAGNOSIS — M25612 Stiffness of left shoulder, not elsewhere classified: Secondary | ICD-10-CM | POA: Diagnosis not present

## 2014-08-01 DIAGNOSIS — M6281 Muscle weakness (generalized): Secondary | ICD-10-CM | POA: Diagnosis not present

## 2014-08-01 DIAGNOSIS — H903 Sensorineural hearing loss, bilateral: Secondary | ICD-10-CM | POA: Diagnosis not present

## 2014-08-03 DIAGNOSIS — M25512 Pain in left shoulder: Secondary | ICD-10-CM | POA: Diagnosis not present

## 2014-08-03 DIAGNOSIS — M25612 Stiffness of left shoulder, not elsewhere classified: Secondary | ICD-10-CM | POA: Diagnosis not present

## 2014-08-03 DIAGNOSIS — M6281 Muscle weakness (generalized): Secondary | ICD-10-CM | POA: Diagnosis not present

## 2014-08-05 DIAGNOSIS — M25612 Stiffness of left shoulder, not elsewhere classified: Secondary | ICD-10-CM | POA: Diagnosis not present

## 2014-08-05 DIAGNOSIS — M25512 Pain in left shoulder: Secondary | ICD-10-CM | POA: Diagnosis not present

## 2014-08-05 DIAGNOSIS — M6281 Muscle weakness (generalized): Secondary | ICD-10-CM | POA: Diagnosis not present

## 2014-08-08 DIAGNOSIS — M25512 Pain in left shoulder: Secondary | ICD-10-CM | POA: Diagnosis not present

## 2014-08-08 DIAGNOSIS — M25612 Stiffness of left shoulder, not elsewhere classified: Secondary | ICD-10-CM | POA: Diagnosis not present

## 2014-08-08 DIAGNOSIS — M6281 Muscle weakness (generalized): Secondary | ICD-10-CM | POA: Diagnosis not present

## 2014-08-10 DIAGNOSIS — M25612 Stiffness of left shoulder, not elsewhere classified: Secondary | ICD-10-CM | POA: Diagnosis not present

## 2014-08-10 DIAGNOSIS — M25512 Pain in left shoulder: Secondary | ICD-10-CM | POA: Diagnosis not present

## 2014-08-10 DIAGNOSIS — M6281 Muscle weakness (generalized): Secondary | ICD-10-CM | POA: Diagnosis not present

## 2014-08-13 ENCOUNTER — Encounter (HOSPITAL_COMMUNITY): Payer: Self-pay | Admitting: *Deleted

## 2014-08-13 ENCOUNTER — Emergency Department (HOSPITAL_COMMUNITY)
Admission: EM | Admit: 2014-08-13 | Discharge: 2014-08-14 | Disposition: A | Discharge status: Home / Self Care | Payer: Medicare Other | Attending: Emergency Medicine | Admitting: Emergency Medicine

## 2014-08-13 ENCOUNTER — Emergency Department (HOSPITAL_COMMUNITY): Payer: Medicare Other

## 2014-08-13 DIAGNOSIS — R109 Unspecified abdominal pain: Secondary | ICD-10-CM | POA: Diagnosis present

## 2014-08-13 DIAGNOSIS — I10 Essential (primary) hypertension: Secondary | ICD-10-CM | POA: Diagnosis not present

## 2014-08-13 DIAGNOSIS — K219 Gastro-esophageal reflux disease without esophagitis: Secondary | ICD-10-CM | POA: Insufficient documentation

## 2014-08-13 DIAGNOSIS — R140 Abdominal distension (gaseous): Secondary | ICD-10-CM | POA: Diagnosis not present

## 2014-08-13 DIAGNOSIS — K5289 Other specified noninfective gastroenteritis and colitis: Secondary | ICD-10-CM | POA: Diagnosis not present

## 2014-08-13 DIAGNOSIS — Z79899 Other long term (current) drug therapy: Secondary | ICD-10-CM | POA: Diagnosis not present

## 2014-08-13 DIAGNOSIS — K529 Noninfective gastroenteritis and colitis, unspecified: Secondary | ICD-10-CM | POA: Insufficient documentation

## 2014-08-13 DIAGNOSIS — M1990 Unspecified osteoarthritis, unspecified site: Secondary | ICD-10-CM | POA: Diagnosis not present

## 2014-08-13 LAB — URINALYSIS, ROUTINE W REFLEX MICROSCOPIC
Bilirubin Urine: NEGATIVE
Glucose, UA: NEGATIVE mg/dL
Hgb urine dipstick: NEGATIVE
Ketones, ur: NEGATIVE mg/dL
Leukocytes, UA: NEGATIVE
NITRITE: NEGATIVE
PROTEIN: NEGATIVE mg/dL
SPECIFIC GRAVITY, URINE: 1.016 (ref 1.005–1.030)
UROBILINOGEN UA: 0.2 mg/dL (ref 0.0–1.0)
pH: 5.5 (ref 5.0–8.0)

## 2014-08-13 LAB — CBC WITH DIFFERENTIAL/PLATELET
BASOS ABS: 0 10*3/uL (ref 0.0–0.1)
Basophils Relative: 0 % (ref 0–1)
EOS ABS: 0.1 10*3/uL (ref 0.0–0.7)
Eosinophils Relative: 1 % (ref 0–5)
HCT: 37.8 % (ref 36.0–46.0)
Hemoglobin: 13.1 g/dL (ref 12.0–15.0)
LYMPHS ABS: 0.7 10*3/uL (ref 0.7–4.0)
Lymphocytes Relative: 11 % — ABNORMAL LOW (ref 12–46)
MCH: 28.5 pg (ref 26.0–34.0)
MCHC: 34.7 g/dL (ref 30.0–36.0)
MCV: 82.4 fL (ref 78.0–100.0)
Monocytes Absolute: 0.6 10*3/uL (ref 0.1–1.0)
Monocytes Relative: 9 % (ref 3–12)
NEUTROS ABS: 5.2 10*3/uL (ref 1.7–7.7)
Neutrophils Relative %: 79 % — ABNORMAL HIGH (ref 43–77)
PLATELETS: 396 10*3/uL (ref 150–400)
RBC: 4.59 MIL/uL (ref 3.87–5.11)
RDW: 16.4 % — ABNORMAL HIGH (ref 11.5–15.5)
WBC: 6.6 10*3/uL (ref 4.0–10.5)

## 2014-08-13 LAB — COMPREHENSIVE METABOLIC PANEL
ALBUMIN: 3.6 g/dL (ref 3.5–5.2)
ALT: 56 U/L — AB (ref 0–35)
AST: 50 U/L — AB (ref 0–37)
Alkaline Phosphatase: 52 U/L (ref 39–117)
Anion gap: 11 (ref 5–15)
BILIRUBIN TOTAL: 0.9 mg/dL (ref 0.3–1.2)
BUN: 16 mg/dL (ref 6–23)
CO2: 26 mmol/L (ref 19–32)
Calcium: 8.4 mg/dL (ref 8.4–10.5)
Chloride: 99 mEq/L (ref 96–112)
Creatinine, Ser: 1.42 mg/dL — ABNORMAL HIGH (ref 0.50–1.10)
GFR, EST AFRICAN AMERICAN: 40 mL/min — AB (ref >90)
GFR, EST NON AFRICAN AMERICAN: 34 mL/min — AB (ref >90)
Glucose, Bld: 122 mg/dL — ABNORMAL HIGH (ref 70–99)
Potassium: 3.6 mmol/L (ref 3.5–5.1)
SODIUM: 136 mmol/L (ref 135–145)
Total Protein: 6.5 g/dL (ref 6.0–8.3)

## 2014-08-13 LAB — LIPASE, BLOOD: Lipase: 52 U/L (ref 11–59)

## 2014-08-13 MED ORDER — ONDANSETRON 4 MG PO TBDP: 4.0000 mg | ORAL_TABLET | Freq: Three times a day (TID) | ORAL | Status: DC | PRN

## 2014-08-13 MED ORDER — SODIUM CHLORIDE 0.9 % IV BOLUS (SEPSIS)
1000.0000 mL | Freq: Once | INTRAVENOUS | Status: AC
  Administered 2014-08-13: 1000 mL via INTRAVENOUS

## 2014-08-13 MED ORDER — ONDANSETRON HCL 4 MG/2ML IJ SOLN: 4.0000 mg | Freq: Once | INTRAMUSCULAR | Status: DC

## 2014-08-13 NOTE — ED Notes (Signed)
The pt has had diarrhea and vomiting since Wednesday abd bubbling according to the pt

## 2014-08-13 NOTE — ED Provider Notes (Signed)
CSN: [id number]     Arrival date & time 08/13/14  2138 History   First MD Initiated Contact with Patient 08/13/14 2203     Chief Complaint  Patient presents with  . Abdominal Pain     (Consider location/radiation/quality/duration/timing/severity/associated sxs/prior Treatment) HPI  81 year old female presents with nausea, vomiting, and diarrhea that started 5 days ago. The patient states that the nausea and vomiting has resolved. She's been taking Imodium that her son gave her and thus the diarrhea has resolved. Had a normal bowel movement yesterday. Continues to feel bloated, especially in her upper abdomen. Denies any pain at any time. No fevers or chills. The patient has not felt the need to eat and has not eaten any solid food today. She has not felt well overall. Has been able to keep down liquids. Her son had similar symptoms that just recently resolved. No blood in her vomit or diarrhea.  Past Medical History  Diagnosis Date  . Hypertension   . GERD (gastroesophageal reflux disease)   . Swelling of both ankles   . Arthritis   . [id number](440.1)    Past Surgical History  Procedure Laterality Date  . Abdominal hysterectomy    . Tonsillectomy    . Appendectomy    . Cholecystectomy N/A 05/28/2013    Procedure: LAPAROSCOPIC CHOLECYSTECTOMY ;  Surgeon: Gwenyth Ober, MD;  Location: [place];  Service: General;  Laterality: N/A;   Family History  Problem Relation Age of Onset  . Diabetes Mother    History  Substance Use Topics  . Smoking status: Never Smoker   . Smokeless tobacco: Never Used  . Alcohol Use: No   OB History    No data available     Review of Systems  Constitutional: Negative for fever.  Respiratory: Negative for shortness of breath.   Cardiovascular: Negative for chest pain.  Gastrointestinal: Positive for nausea, vomiting and diarrhea. Negative for abdominal pain and blood in stool.       Abdominal bloating  Genitourinary: Negative for dysuria.   Musculoskeletal: Negative for back pain.  All other systems reviewed and are negative.     Allergies  Review of patient's allergies indicates no known allergies.  Home Medications   Prior to Admission medications   Medication Sig Start Date End Date Taking? Authorizing Provider  azaTHIOprine (IMURAN) 50 MG tablet Take 50 mg by mouth daily.    Historical Provider, MD  folic acid (FOLVITE) 1 MG tablet Take 1 mg by mouth daily.    Historical Provider, MD  hydrochlorothiazide (HYDRODIURIL) 25 MG tablet Take 25 mg by mouth daily.    Historical Provider, MD  losartan (COZAAR) 50 MG tablet Take 50 mg by mouth daily.    Historical Provider, MD  methotrexate 2.5 MG tablet Take 15 mg by mouth once a week.    Historical Provider, MD  omeprazole (PRILOSEC) 40 MG capsule Take 40 mg by mouth daily.    Historical Provider, MD  predniSONE (DELTASONE) 5 MG tablet Take 5 mg by mouth daily.    Historical Provider, MD   BP 153/55 mmHg  Pulse 90  Temp(Src) 98.6 F (37 C)  Resp 16  SpO2 94% Physical Exam  Constitutional: She is oriented to person, place, and time. She appears well-developed and well-nourished.  HENT:  Head: Normocephalic and atraumatic.  Right Ear: External ear normal.  Left Ear: External ear normal.  Nose: Nose normal.  Eyes: Right eye exhibits no discharge. Left eye exhibits no discharge.  Cardiovascular: Normal  rate, regular rhythm and normal heart sounds.   Pulmonary/Chest: Effort normal and breath sounds normal.  Abdominal: Soft. She exhibits no distension. There is no tenderness.  Neurological: She is alert and oriented to person, place, and time.  Skin: Skin is warm and dry.  Nursing note and vitals reviewed.   ED Course  Procedures (including critical care time) Labs Review Labs Reviewed  CBC WITH DIFFERENTIAL - Abnormal; Notable for the following:    RDW 16.4 (*)    Neutrophils Relative % 79 (*)    Lymphocytes Relative 11 (*)    All other components within  normal limits  COMPREHENSIVE METABOLIC PANEL - Abnormal; Notable for the following:    Glucose, Bld 122 (*)    Creatinine, Ser 1.42 (*)    AST 50 (*)    ALT 56 (*)    GFR calc non Af Amer 34 (*)    GFR calc Af Amer 40 (*)    All other components within normal limits  URINALYSIS, ROUTINE W REFLEX MICROSCOPIC - Abnormal; Notable for the following:    APPearance CLOUDY (*)    All other components within normal limits  LIPASE, BLOOD    Imaging Review Dg Abd Acute W/chest  08/13/2014   CLINICAL DATA:  Acute onset of epigastric abdominal pain, nausea, vomiting and diarrhea. Loss of appetite. Abdominal bloating. Initial encounter.  EXAM: ACUTE ABDOMEN SERIES (ABDOMEN 2 VIEW & CHEST 1 VIEW)  COMPARISON:  Chest radiograph performed 05/28/2013  FINDINGS: The lungs are well-aerated and clear. There is no evidence of focal opacification, pleural effusion or pneumothorax. The cardiomediastinal silhouette is within normal limits.  The visualized bowel gas pattern is unremarkable. Scattered fluid and air are seen within the colon; there is no evidence of small bowel dilatation to suggest obstruction. No free intra-abdominal air is identified on the provided upright view. Clips are noted within the right upper quadrant, reflecting prior cholecystectomy.  No acute osseous abnormalities are seen; the sacroiliac joints are unremarkable in appearance.  IMPRESSION: 1. Unremarkable bowel gas pattern; no free intra-abdominal air seen. 2. No acute cardiopulmonary process identified.   Electronically Signed   By: Garald Balding M.D.   On: 08/13/2014 23:45     EKG Interpretation None      MDM   Final diagnoses:  Gastroenteritis    Patient's lab work and x-ray are reassuring. She has no vomiting or abdominal pain or tenderness currently. Feels better with fluids and Zofran. At this point I believe she is getting over a gastroenteritis, will treat with Zofran and recommend progressive diet from liquids to solids.  Stable for discharge.    Ephraim Hamburger, MD 08/14/14 0030

## 2014-08-13 NOTE — ED Notes (Signed)
Pt to xray at this time.

## 2014-08-15 DIAGNOSIS — M25512 Pain in left shoulder: Secondary | ICD-10-CM | POA: Diagnosis not present

## 2014-08-15 DIAGNOSIS — M25612 Stiffness of left shoulder, not elsewhere classified: Secondary | ICD-10-CM | POA: Diagnosis not present

## 2014-08-15 DIAGNOSIS — M6281 Muscle weakness (generalized): Secondary | ICD-10-CM | POA: Diagnosis not present

## 2014-08-17 DIAGNOSIS — M6281 Muscle weakness (generalized): Secondary | ICD-10-CM | POA: Diagnosis not present

## 2014-08-17 DIAGNOSIS — M25612 Stiffness of left shoulder, not elsewhere classified: Secondary | ICD-10-CM | POA: Diagnosis not present

## 2014-08-17 DIAGNOSIS — M25512 Pain in left shoulder: Secondary | ICD-10-CM | POA: Diagnosis not present

## 2014-08-24 DIAGNOSIS — M25612 Stiffness of left shoulder, not elsewhere classified: Secondary | ICD-10-CM | POA: Diagnosis not present

## 2014-08-24 DIAGNOSIS — M6281 Muscle weakness (generalized): Secondary | ICD-10-CM | POA: Diagnosis not present

## 2014-08-24 DIAGNOSIS — M25512 Pain in left shoulder: Secondary | ICD-10-CM | POA: Diagnosis not present

## 2014-08-29 DIAGNOSIS — M25512 Pain in left shoulder: Secondary | ICD-10-CM | POA: Diagnosis not present

## 2014-08-29 DIAGNOSIS — M25612 Stiffness of left shoulder, not elsewhere classified: Secondary | ICD-10-CM | POA: Diagnosis not present

## 2014-08-29 DIAGNOSIS — M6281 Muscle weakness (generalized): Secondary | ICD-10-CM | POA: Diagnosis not present

## 2014-08-31 DIAGNOSIS — M25612 Stiffness of left shoulder, not elsewhere classified: Secondary | ICD-10-CM | POA: Diagnosis not present

## 2014-08-31 DIAGNOSIS — M25512 Pain in left shoulder: Secondary | ICD-10-CM | POA: Diagnosis not present

## 2014-08-31 DIAGNOSIS — M6281 Muscle weakness (generalized): Secondary | ICD-10-CM | POA: Diagnosis not present

## 2014-09-01 DIAGNOSIS — M150 Primary generalized (osteo)arthritis: Secondary | ICD-10-CM | POA: Diagnosis not present

## 2014-09-01 DIAGNOSIS — Z79899 Other long term (current) drug therapy: Secondary | ICD-10-CM | POA: Diagnosis not present

## 2014-09-01 DIAGNOSIS — M3320 Polymyositis, organ involvement unspecified: Secondary | ICD-10-CM | POA: Diagnosis not present

## 2014-09-01 DIAGNOSIS — Z13820 Encounter for screening for osteoporosis: Secondary | ICD-10-CM | POA: Diagnosis not present

## 2014-09-01 DIAGNOSIS — N183 Chronic kidney disease, stage 3 (moderate): Secondary | ICD-10-CM | POA: Diagnosis not present

## 2014-09-02 DIAGNOSIS — M3320 Polymyositis, organ involvement unspecified: Secondary | ICD-10-CM | POA: Diagnosis not present

## 2014-09-08 DIAGNOSIS — M542 Cervicalgia: Secondary | ICD-10-CM | POA: Diagnosis not present

## 2014-09-08 DIAGNOSIS — M25512 Pain in left shoulder: Secondary | ICD-10-CM | POA: Diagnosis not present

## 2014-10-05 DIAGNOSIS — M47892 Other spondylosis, cervical region: Secondary | ICD-10-CM | POA: Diagnosis not present

## 2014-10-05 DIAGNOSIS — Z6834 Body mass index (BMI) 34.0-34.9, adult: Secondary | ICD-10-CM | POA: Diagnosis not present

## 2014-10-05 DIAGNOSIS — M542 Cervicalgia: Secondary | ICD-10-CM | POA: Diagnosis not present

## 2014-10-05 DIAGNOSIS — M75102 Unspecified rotator cuff tear or rupture of left shoulder, not specified as traumatic: Secondary | ICD-10-CM | POA: Diagnosis not present

## 2014-10-05 DIAGNOSIS — M5032 Other cervical disc degeneration, mid-cervical region: Secondary | ICD-10-CM | POA: Diagnosis not present

## 2014-10-12 DIAGNOSIS — M25512 Pain in left shoulder: Secondary | ICD-10-CM | POA: Diagnosis not present

## 2014-10-12 DIAGNOSIS — M47892 Other spondylosis, cervical region: Secondary | ICD-10-CM | POA: Diagnosis not present

## 2014-10-18 DIAGNOSIS — M25512 Pain in left shoulder: Secondary | ICD-10-CM | POA: Diagnosis not present

## 2014-10-18 DIAGNOSIS — M47892 Other spondylosis, cervical region: Secondary | ICD-10-CM | POA: Diagnosis not present

## 2014-10-20 DIAGNOSIS — M25512 Pain in left shoulder: Secondary | ICD-10-CM | POA: Diagnosis not present

## 2014-10-20 DIAGNOSIS — M47892 Other spondylosis, cervical region: Secondary | ICD-10-CM | POA: Diagnosis not present

## 2014-10-24 DIAGNOSIS — M25512 Pain in left shoulder: Secondary | ICD-10-CM | POA: Diagnosis not present

## 2014-10-24 DIAGNOSIS — M47892 Other spondylosis, cervical region: Secondary | ICD-10-CM | POA: Diagnosis not present

## 2014-10-26 DIAGNOSIS — M25512 Pain in left shoulder: Secondary | ICD-10-CM | POA: Diagnosis not present

## 2014-10-26 DIAGNOSIS — M47892 Other spondylosis, cervical region: Secondary | ICD-10-CM | POA: Diagnosis not present

## 2014-10-31 DIAGNOSIS — M47892 Other spondylosis, cervical region: Secondary | ICD-10-CM | POA: Diagnosis not present

## 2014-10-31 DIAGNOSIS — M25512 Pain in left shoulder: Secondary | ICD-10-CM | POA: Diagnosis not present

## 2014-10-31 DIAGNOSIS — Z1231 Encounter for screening mammogram for malignant neoplasm of breast: Secondary | ICD-10-CM | POA: Diagnosis not present

## 2014-10-31 DIAGNOSIS — Z124 Encounter for screening for malignant neoplasm of cervix: Secondary | ICD-10-CM | POA: Diagnosis not present

## 2014-11-02 DIAGNOSIS — M25512 Pain in left shoulder: Secondary | ICD-10-CM | POA: Diagnosis not present

## 2014-11-02 DIAGNOSIS — M47892 Other spondylosis, cervical region: Secondary | ICD-10-CM | POA: Diagnosis not present

## 2014-11-07 DIAGNOSIS — M25512 Pain in left shoulder: Secondary | ICD-10-CM | POA: Diagnosis not present

## 2014-11-07 DIAGNOSIS — M47892 Other spondylosis, cervical region: Secondary | ICD-10-CM | POA: Diagnosis not present

## 2014-11-09 DIAGNOSIS — M25512 Pain in left shoulder: Secondary | ICD-10-CM | POA: Diagnosis not present

## 2014-11-09 DIAGNOSIS — M47892 Other spondylosis, cervical region: Secondary | ICD-10-CM | POA: Diagnosis not present

## 2014-11-14 DIAGNOSIS — M47892 Other spondylosis, cervical region: Secondary | ICD-10-CM | POA: Diagnosis not present

## 2014-11-14 DIAGNOSIS — M25512 Pain in left shoulder: Secondary | ICD-10-CM | POA: Diagnosis not present

## 2014-11-16 DIAGNOSIS — M47892 Other spondylosis, cervical region: Secondary | ICD-10-CM | POA: Diagnosis not present

## 2014-11-16 DIAGNOSIS — M25512 Pain in left shoulder: Secondary | ICD-10-CM | POA: Diagnosis not present

## 2014-11-21 DIAGNOSIS — M47892 Other spondylosis, cervical region: Secondary | ICD-10-CM | POA: Diagnosis not present

## 2014-11-21 DIAGNOSIS — M25512 Pain in left shoulder: Secondary | ICD-10-CM | POA: Diagnosis not present

## 2014-11-23 DIAGNOSIS — M5032 Other cervical disc degeneration, mid-cervical region: Secondary | ICD-10-CM | POA: Diagnosis not present

## 2014-11-23 DIAGNOSIS — M75102 Unspecified rotator cuff tear or rupture of left shoulder, not specified as traumatic: Secondary | ICD-10-CM | POA: Diagnosis not present

## 2014-11-23 DIAGNOSIS — M545 Low back pain: Secondary | ICD-10-CM | POA: Diagnosis not present

## 2014-11-23 DIAGNOSIS — M47812 Spondylosis without myelopathy or radiculopathy, cervical region: Secondary | ICD-10-CM | POA: Diagnosis not present

## 2014-12-09 DIAGNOSIS — M150 Primary generalized (osteo)arthritis: Secondary | ICD-10-CM | POA: Diagnosis not present

## 2014-12-09 DIAGNOSIS — N183 Chronic kidney disease, stage 3 (moderate): Secondary | ICD-10-CM | POA: Diagnosis not present

## 2014-12-09 DIAGNOSIS — M3320 Polymyositis, organ involvement unspecified: Secondary | ICD-10-CM | POA: Diagnosis not present

## 2015-03-15 DIAGNOSIS — H25012 Cortical age-related cataract, left eye: Secondary | ICD-10-CM | POA: Diagnosis not present

## 2015-03-15 DIAGNOSIS — H25042 Posterior subcapsular polar age-related cataract, left eye: Secondary | ICD-10-CM | POA: Diagnosis not present

## 2015-03-15 DIAGNOSIS — H2512 Age-related nuclear cataract, left eye: Secondary | ICD-10-CM | POA: Diagnosis not present

## 2015-03-15 DIAGNOSIS — Z961 Presence of intraocular lens: Secondary | ICD-10-CM | POA: Diagnosis not present

## 2015-03-17 DIAGNOSIS — Z7952 Long term (current) use of systemic steroids: Secondary | ICD-10-CM | POA: Diagnosis not present

## 2015-03-17 DIAGNOSIS — M3322 Polymyositis with myopathy: Secondary | ICD-10-CM | POA: Diagnosis not present

## 2015-03-17 DIAGNOSIS — M150 Primary generalized (osteo)arthritis: Secondary | ICD-10-CM | POA: Diagnosis not present

## 2015-03-17 DIAGNOSIS — N183 Chronic kidney disease, stage 3 (moderate): Secondary | ICD-10-CM | POA: Diagnosis not present

## 2015-03-29 DIAGNOSIS — H903 Sensorineural hearing loss, bilateral: Secondary | ICD-10-CM | POA: Diagnosis not present

## 2015-03-30 DIAGNOSIS — H2512 Age-related nuclear cataract, left eye: Secondary | ICD-10-CM | POA: Diagnosis not present

## 2015-03-30 DIAGNOSIS — H25012 Cortical age-related cataract, left eye: Secondary | ICD-10-CM | POA: Diagnosis not present

## 2015-03-30 DIAGNOSIS — H25812 Combined forms of age-related cataract, left eye: Secondary | ICD-10-CM | POA: Diagnosis not present

## 2015-03-30 DIAGNOSIS — H25042 Posterior subcapsular polar age-related cataract, left eye: Secondary | ICD-10-CM | POA: Diagnosis not present

## 2015-04-14 DIAGNOSIS — Z23 Encounter for immunization: Secondary | ICD-10-CM | POA: Diagnosis not present

## 2015-05-17 DIAGNOSIS — I10 Essential (primary) hypertension: Secondary | ICD-10-CM | POA: Diagnosis not present

## 2015-05-17 DIAGNOSIS — M3320 Polymyositis, organ involvement unspecified: Secondary | ICD-10-CM | POA: Diagnosis not present

## 2015-05-17 DIAGNOSIS — L853 Xerosis cutis: Secondary | ICD-10-CM | POA: Diagnosis not present

## 2015-05-17 DIAGNOSIS — R1011 Right upper quadrant pain: Secondary | ICD-10-CM | POA: Diagnosis not present

## 2015-05-30 DIAGNOSIS — M3320 Polymyositis, organ involvement unspecified: Secondary | ICD-10-CM | POA: Diagnosis not present

## 2015-06-02 DIAGNOSIS — R7989 Other specified abnormal findings of blood chemistry: Secondary | ICD-10-CM | POA: Diagnosis not present

## 2015-06-02 DIAGNOSIS — R51 Headache: Secondary | ICD-10-CM | POA: Diagnosis not present

## 2015-06-02 DIAGNOSIS — I10 Essential (primary) hypertension: Secondary | ICD-10-CM | POA: Diagnosis not present

## 2015-06-02 DIAGNOSIS — R739 Hyperglycemia, unspecified: Secondary | ICD-10-CM | POA: Diagnosis not present

## 2015-06-14 DIAGNOSIS — M150 Primary generalized (osteo)arthritis: Secondary | ICD-10-CM | POA: Diagnosis not present

## 2015-06-14 DIAGNOSIS — M3322 Polymyositis with myopathy: Secondary | ICD-10-CM | POA: Diagnosis not present

## 2015-06-14 DIAGNOSIS — Z7952 Long term (current) use of systemic steroids: Secondary | ICD-10-CM | POA: Diagnosis not present

## 2015-06-14 DIAGNOSIS — N183 Chronic kidney disease, stage 3 (moderate): Secondary | ICD-10-CM | POA: Diagnosis not present

## 2015-07-14 DIAGNOSIS — Z136 Encounter for screening for cardiovascular disorders: Secondary | ICD-10-CM | POA: Diagnosis not present

## 2015-07-14 DIAGNOSIS — R739 Hyperglycemia, unspecified: Secondary | ICD-10-CM | POA: Diagnosis not present

## 2015-07-14 DIAGNOSIS — E119 Type 2 diabetes mellitus without complications: Secondary | ICD-10-CM | POA: Diagnosis not present

## 2015-07-14 DIAGNOSIS — R51 Headache: Secondary | ICD-10-CM | POA: Diagnosis not present

## 2015-07-14 DIAGNOSIS — I10 Essential (primary) hypertension: Secondary | ICD-10-CM | POA: Diagnosis not present

## 2015-07-17 ENCOUNTER — Other Ambulatory Visit: Payer: Self-pay | Admitting: Family Medicine

## 2015-07-17 DIAGNOSIS — R42 Dizziness and giddiness: Secondary | ICD-10-CM

## 2015-08-11 DIAGNOSIS — Z7689 Persons encountering health services in other specified circumstances: Secondary | ICD-10-CM | POA: Diagnosis not present

## 2015-08-11 DIAGNOSIS — I10 Essential (primary) hypertension: Secondary | ICD-10-CM | POA: Diagnosis not present

## 2015-08-11 DIAGNOSIS — M3320 Polymyositis, organ involvement unspecified: Secondary | ICD-10-CM | POA: Diagnosis not present

## 2015-08-11 DIAGNOSIS — R1011 Right upper quadrant pain: Secondary | ICD-10-CM | POA: Diagnosis not present

## 2015-08-11 DIAGNOSIS — E119 Type 2 diabetes mellitus without complications: Secondary | ICD-10-CM | POA: Diagnosis not present

## 2015-08-22 ENCOUNTER — Ambulatory Visit: Payer: Medicare Other | Admitting: Family Medicine

## 2015-08-22 DIAGNOSIS — L602 Onychogryphosis: Secondary | ICD-10-CM | POA: Diagnosis not present

## 2015-08-22 DIAGNOSIS — M216X1 Other acquired deformities of right foot: Secondary | ICD-10-CM | POA: Diagnosis not present

## 2015-08-22 DIAGNOSIS — M216X2 Other acquired deformities of left foot: Secondary | ICD-10-CM | POA: Diagnosis not present

## 2015-08-22 DIAGNOSIS — E119 Type 2 diabetes mellitus without complications: Secondary | ICD-10-CM | POA: Diagnosis not present

## 2015-08-29 ENCOUNTER — Ambulatory Visit: Payer: Medicare Other | Admitting: Family Medicine

## 2015-09-13 DIAGNOSIS — M3322 Polymyositis with myopathy: Secondary | ICD-10-CM | POA: Diagnosis not present

## 2015-09-13 DIAGNOSIS — Z79899 Other long term (current) drug therapy: Secondary | ICD-10-CM | POA: Diagnosis not present

## 2015-09-13 DIAGNOSIS — Z7952 Long term (current) use of systemic steroids: Secondary | ICD-10-CM | POA: Diagnosis not present

## 2015-09-13 DIAGNOSIS — M150 Primary generalized (osteo)arthritis: Secondary | ICD-10-CM | POA: Diagnosis not present

## 2015-09-13 DIAGNOSIS — N183 Chronic kidney disease, stage 3 (moderate): Secondary | ICD-10-CM | POA: Diagnosis not present

## 2015-10-06 DIAGNOSIS — R945 Abnormal results of liver function studies: Secondary | ICD-10-CM | POA: Diagnosis not present

## 2015-10-06 DIAGNOSIS — R1011 Right upper quadrant pain: Secondary | ICD-10-CM | POA: Diagnosis not present

## 2015-10-11 DIAGNOSIS — Y9342 Activity, yoga: Secondary | ICD-10-CM

## 2015-10-11 NOTE — Congregational Nurse Program (Signed)
Congregational Nurse Program Note  Date of Encounter: 10/11/2015  Past Medical History: Past Medical History  Diagnosis Date  . Hypertension   . GERD (gastroesophageal reflux disease)   . Swelling of both ankles   . Arthritis   . [id number])     Encounter Details:     CNP Questionnaire - 10/11/15 1527    Patient Demographics   Patient is considered a/an Not Applicable   Race African-American/Black   Patient Assistance   Location of Patient Assistance Not Applicable   Patient's financial/insurance status Medicare   Uninsured Patient No   Patient referred to apply for the following financial assistance Not Applicable   Food insecurities addressed Not Applicable   Transportation assistance No   Assistance securing medications No   Educational health offerings Exercise/physical activity   Encounter Details   Primary purpose of visit Education/Health Concerns   Was an Emergency Department visit averted? Not Applicable   Does patient have a medical provider? Yes   Patient referred to Not Applicable   Was a mental health screening completed? (GAINS tool) No   Does patient have dental issues? No   Does patient have vision issues? No   Since previous encounter, have you referred patient for abnormal blood pressure that resulted in a new diagnosis or medication change? No   Since previous encounter, have you referred patient for abnormal blood glucose that resulted in a new diagnosis or medication change? No   For Abstraction Use Only   Does patient have insurance? Yes       Attended Chair Yoga Class.

## 2015-10-13 DIAGNOSIS — Z961 Presence of intraocular lens: Secondary | ICD-10-CM | POA: Diagnosis not present

## 2015-11-02 DIAGNOSIS — Z1231 Encounter for screening mammogram for malignant neoplasm of breast: Secondary | ICD-10-CM | POA: Diagnosis not present

## 2015-11-06 DIAGNOSIS — Y9342 Activity, yoga: Secondary | ICD-10-CM

## 2015-11-06 NOTE — Congregational Nurse Program (Signed)
Congregational Nurse Program Note  Date of Encounter: 11/06/2015  Past Medical History: Past Medical History  Diagnosis Date  . Hypertension   . GERD (gastroesophageal reflux disease)   . Swelling of both ankles   . Arthritis   . [id number])     Encounter Details:     CNP Questionnaire - 11/06/15 0949    Patient Demographics   Is this a new or existing patient? Existing   Patient is considered a/an Not Applicable   Race African-American/Black   Patient Assistance   Location of Patient Assistance Not Applicable   Patient's financial/insurance status Medicare   Uninsured Patient No   Patient referred to apply for the following financial assistance Not Applicable   Food insecurities addressed Not Applicable   Transportation assistance No   Assistance securing medications No   Educational health offerings Exercise/physical activity   Encounter Details   Primary purpose of visit Education/Health Concerns   Was an Emergency Department visit averted? Not Applicable   Does patient have a medical provider? Yes   Patient referred to Not Applicable   Was a mental health screening completed? (GAINS tool) No   Does patient have dental issues? No   Does patient have vision issues? No   Since previous encounter, have you referred patient for abnormal blood pressure that resulted in a new diagnosis or medication change? No   Since previous encounter, have you referred patient for abnormal blood glucose that resulted in a new diagnosis or medication change? No   For Abstraction Use Only   Does patient have insurance? Yes       Attended chair yoga class.

## 2015-11-08 DIAGNOSIS — M21272 Flexion deformity, left ankle and toes: Secondary | ICD-10-CM | POA: Diagnosis not present

## 2015-11-08 DIAGNOSIS — M19072 Primary osteoarthritis, left ankle and foot: Secondary | ICD-10-CM | POA: Diagnosis not present

## 2015-11-08 DIAGNOSIS — L603 Nail dystrophy: Secondary | ICD-10-CM | POA: Diagnosis not present

## 2015-11-08 DIAGNOSIS — M24572 Contracture, left ankle: Secondary | ICD-10-CM | POA: Diagnosis not present

## 2015-11-08 DIAGNOSIS — M76822 Posterior tibial tendinitis, left leg: Secondary | ICD-10-CM | POA: Diagnosis not present

## 2015-11-21 DIAGNOSIS — R232 Flushing: Secondary | ICD-10-CM | POA: Diagnosis not present

## 2015-11-21 DIAGNOSIS — L678 Other hair color and hair shaft abnormalities: Secondary | ICD-10-CM | POA: Diagnosis not present

## 2015-11-22 DIAGNOSIS — E1122 Type 2 diabetes mellitus with diabetic chronic kidney disease: Secondary | ICD-10-CM | POA: Diagnosis not present

## 2015-11-22 DIAGNOSIS — M3320 Polymyositis, organ involvement unspecified: Secondary | ICD-10-CM | POA: Diagnosis not present

## 2015-11-22 DIAGNOSIS — Z23 Encounter for immunization: Secondary | ICD-10-CM | POA: Diagnosis not present

## 2015-11-22 DIAGNOSIS — N182 Chronic kidney disease, stage 2 (mild): Secondary | ICD-10-CM | POA: Diagnosis not present

## 2015-11-22 DIAGNOSIS — I10 Essential (primary) hypertension: Secondary | ICD-10-CM | POA: Diagnosis not present

## 2015-11-22 DIAGNOSIS — E119 Type 2 diabetes mellitus without complications: Secondary | ICD-10-CM | POA: Diagnosis not present

## 2015-12-04 DIAGNOSIS — Y9342 Activity, yoga: Secondary | ICD-10-CM

## 2015-12-04 NOTE — Congregational Nurse Program (Signed)
Congregational Nurse Program Note  Date of Encounter: 12/04/2015  Past Medical History: Past Medical History  Diagnosis Date  . Hypertension   . GERD (gastroesophageal reflux disease)   . Swelling of both ankles   . Arthritis   . [id number])     Encounter Details:     CNP Questionnaire - 12/04/15 1126    Patient Demographics   Is this a new or existing patient? Existing   Patient is considered a/an Not Applicable   Race African-American/Black   Patient Assistance   Location of Patient Assistance Not Applicable   Patient's financial/insurance status Medicare   Uninsured Patient No   Patient referred to apply for the following financial assistance Not Applicable   Food insecurities addressed Not Applicable   Transportation assistance No   Assistance securing medications No   Educational health offerings Exercise/physical activity   Encounter Details   Primary purpose of visit Education/Health Concerns   Was an Emergency Department visit averted? Not Applicable   Does patient have a medical provider? Yes   Patient referred to Not Applicable   Was a mental health screening completed? (GAINS tool) No   Does patient have dental issues? No   Does patient have vision issues? No   Does your patient have an abnormal blood pressure today? No   Since previous encounter, have you referred patient for abnormal blood pressure that resulted in a new diagnosis or medication change? No   Does your patient have an abnormal blood glucose today? No   Since previous encounter, have you referred patient for abnormal blood glucose that resulted in a new diagnosis or medication change? No   Was there a life-saving intervention made? No       Attended chair yoga class.

## 2015-12-13 DIAGNOSIS — M25552 Pain in left hip: Secondary | ICD-10-CM | POA: Diagnosis not present

## 2015-12-13 DIAGNOSIS — Z79899 Other long term (current) drug therapy: Secondary | ICD-10-CM | POA: Diagnosis not present

## 2015-12-13 DIAGNOSIS — M3322 Polymyositis with myopathy: Secondary | ICD-10-CM | POA: Diagnosis not present

## 2015-12-13 DIAGNOSIS — N183 Chronic kidney disease, stage 3 (moderate): Secondary | ICD-10-CM | POA: Diagnosis not present

## 2015-12-13 DIAGNOSIS — Z7952 Long term (current) use of systemic steroids: Secondary | ICD-10-CM | POA: Diagnosis not present

## 2015-12-13 DIAGNOSIS — M150 Primary generalized (osteo)arthritis: Secondary | ICD-10-CM | POA: Diagnosis not present

## 2015-12-15 DIAGNOSIS — M5442 Lumbago with sciatica, left side: Secondary | ICD-10-CM | POA: Diagnosis not present

## 2015-12-15 DIAGNOSIS — G8929 Other chronic pain: Secondary | ICD-10-CM | POA: Diagnosis not present

## 2015-12-15 DIAGNOSIS — M25562 Pain in left knee: Secondary | ICD-10-CM | POA: Diagnosis not present

## 2015-12-20 DIAGNOSIS — M5416 Radiculopathy, lumbar region: Secondary | ICD-10-CM | POA: Diagnosis not present

## 2015-12-26 DIAGNOSIS — M5416 Radiculopathy, lumbar region: Secondary | ICD-10-CM | POA: Diagnosis not present

## 2015-12-28 DIAGNOSIS — M5416 Radiculopathy, lumbar region: Secondary | ICD-10-CM | POA: Diagnosis not present

## 2016-01-03 DIAGNOSIS — M5416 Radiculopathy, lumbar region: Secondary | ICD-10-CM | POA: Diagnosis not present

## 2016-01-08 DIAGNOSIS — M5416 Radiculopathy, lumbar region: Secondary | ICD-10-CM | POA: Diagnosis not present

## 2016-01-15 DIAGNOSIS — M5416 Radiculopathy, lumbar region: Secondary | ICD-10-CM | POA: Diagnosis not present

## 2016-01-18 DIAGNOSIS — M25569 Pain in unspecified knee: Secondary | ICD-10-CM | POA: Diagnosis not present

## 2016-01-22 DIAGNOSIS — M5416 Radiculopathy, lumbar region: Secondary | ICD-10-CM | POA: Diagnosis not present

## 2016-01-23 DIAGNOSIS — M5136 Other intervertebral disc degeneration, lumbar region: Secondary | ICD-10-CM | POA: Diagnosis not present

## 2016-01-23 DIAGNOSIS — G8929 Other chronic pain: Secondary | ICD-10-CM | POA: Diagnosis not present

## 2016-01-23 DIAGNOSIS — M1288 Other specific arthropathies, not elsewhere classified, other specified site: Secondary | ICD-10-CM | POA: Diagnosis not present

## 2016-01-23 DIAGNOSIS — M5416 Radiculopathy, lumbar region: Secondary | ICD-10-CM | POA: Diagnosis not present

## 2016-01-23 DIAGNOSIS — M545 Low back pain: Secondary | ICD-10-CM | POA: Diagnosis not present

## 2016-01-23 DIAGNOSIS — M4316 Spondylolisthesis, lumbar region: Secondary | ICD-10-CM | POA: Diagnosis not present

## 2016-02-01 DIAGNOSIS — M25562 Pain in left knee: Secondary | ICD-10-CM | POA: Diagnosis not present

## 2016-02-01 DIAGNOSIS — M25561 Pain in right knee: Secondary | ICD-10-CM | POA: Diagnosis not present

## 2016-03-04 DIAGNOSIS — M25562 Pain in left knee: Secondary | ICD-10-CM | POA: Diagnosis not present

## 2016-03-04 DIAGNOSIS — M25461 Effusion, right knee: Secondary | ICD-10-CM | POA: Diagnosis not present

## 2016-03-04 DIAGNOSIS — M25462 Effusion, left knee: Secondary | ICD-10-CM | POA: Diagnosis not present

## 2016-03-04 DIAGNOSIS — M1711 Unilateral primary osteoarthritis, right knee: Secondary | ICD-10-CM | POA: Diagnosis not present

## 2016-03-04 DIAGNOSIS — M25561 Pain in right knee: Secondary | ICD-10-CM | POA: Diagnosis not present

## 2016-03-04 DIAGNOSIS — M1712 Unilateral primary osteoarthritis, left knee: Secondary | ICD-10-CM | POA: Diagnosis not present

## 2016-03-05 DIAGNOSIS — M1288 Other specific arthropathies, not elsewhere classified, other specified site: Secondary | ICD-10-CM | POA: Diagnosis not present

## 2016-03-05 DIAGNOSIS — M5416 Radiculopathy, lumbar region: Secondary | ICD-10-CM | POA: Diagnosis not present

## 2016-03-05 DIAGNOSIS — M5136 Other intervertebral disc degeneration, lumbar region: Secondary | ICD-10-CM | POA: Diagnosis not present

## 2016-03-05 DIAGNOSIS — M5126 Other intervertebral disc displacement, lumbar region: Secondary | ICD-10-CM | POA: Diagnosis not present

## 2016-03-05 DIAGNOSIS — M47816 Spondylosis without myelopathy or radiculopathy, lumbar region: Secondary | ICD-10-CM | POA: Diagnosis not present

## 2016-03-05 DIAGNOSIS — M4316 Spondylolisthesis, lumbar region: Secondary | ICD-10-CM | POA: Diagnosis not present

## 2016-03-05 DIAGNOSIS — M4806 Spinal stenosis, lumbar region: Secondary | ICD-10-CM | POA: Diagnosis not present

## 2016-03-06 DIAGNOSIS — S8002XA Contusion of left knee, initial encounter: Secondary | ICD-10-CM | POA: Diagnosis not present

## 2016-03-08 DIAGNOSIS — M5417 Radiculopathy, lumbosacral region: Secondary | ICD-10-CM | POA: Diagnosis not present

## 2016-03-08 DIAGNOSIS — M4806 Spinal stenosis, lumbar region: Secondary | ICD-10-CM | POA: Diagnosis not present

## 2016-03-08 DIAGNOSIS — M4316 Spondylolisthesis, lumbar region: Secondary | ICD-10-CM | POA: Diagnosis not present

## 2016-03-28 DIAGNOSIS — M25512 Pain in left shoulder: Secondary | ICD-10-CM | POA: Diagnosis not present

## 2016-04-03 DIAGNOSIS — M6281 Muscle weakness (generalized): Secondary | ICD-10-CM | POA: Diagnosis not present

## 2016-04-03 DIAGNOSIS — M545 Low back pain: Secondary | ICD-10-CM | POA: Diagnosis not present

## 2016-04-03 DIAGNOSIS — M5416 Radiculopathy, lumbar region: Secondary | ICD-10-CM | POA: Diagnosis not present

## 2016-04-05 DIAGNOSIS — Z23 Encounter for immunization: Secondary | ICD-10-CM | POA: Diagnosis not present

## 2016-04-08 DIAGNOSIS — M5416 Radiculopathy, lumbar region: Secondary | ICD-10-CM | POA: Diagnosis not present

## 2016-04-08 DIAGNOSIS — M6281 Muscle weakness (generalized): Secondary | ICD-10-CM | POA: Diagnosis not present

## 2016-04-08 DIAGNOSIS — M545 Low back pain: Secondary | ICD-10-CM | POA: Diagnosis not present

## 2016-04-10 DIAGNOSIS — M545 Low back pain: Secondary | ICD-10-CM | POA: Diagnosis not present

## 2016-04-10 DIAGNOSIS — M6281 Muscle weakness (generalized): Secondary | ICD-10-CM | POA: Diagnosis not present

## 2016-04-10 DIAGNOSIS — M5416 Radiculopathy, lumbar region: Secondary | ICD-10-CM | POA: Diagnosis not present

## 2016-04-15 DIAGNOSIS — M545 Low back pain: Secondary | ICD-10-CM | POA: Diagnosis not present

## 2016-04-15 DIAGNOSIS — M6281 Muscle weakness (generalized): Secondary | ICD-10-CM | POA: Diagnosis not present

## 2016-04-15 DIAGNOSIS — M5416 Radiculopathy, lumbar region: Secondary | ICD-10-CM | POA: Diagnosis not present

## 2016-04-17 ENCOUNTER — Emergency Department (HOSPITAL_COMMUNITY)
Admission: EM | Admit: 2016-04-17 | Discharge: 2016-04-17 | Disposition: A | Discharge status: Home / Self Care | Payer: Medicare Other | Attending: Emergency Medicine | Admitting: Emergency Medicine

## 2016-04-17 ENCOUNTER — Encounter (HOSPITAL_COMMUNITY): Payer: Self-pay | Admitting: *Deleted

## 2016-04-17 ENCOUNTER — Emergency Department (HOSPITAL_COMMUNITY): Payer: Medicare Other

## 2016-04-17 DIAGNOSIS — Z7952 Long term (current) use of systemic steroids: Secondary | ICD-10-CM | POA: Diagnosis not present

## 2016-04-17 DIAGNOSIS — M3322 Polymyositis with myopathy: Secondary | ICD-10-CM | POA: Diagnosis not present

## 2016-04-17 DIAGNOSIS — Z7982 Long term (current) use of aspirin: Secondary | ICD-10-CM | POA: Diagnosis not present

## 2016-04-17 DIAGNOSIS — D649 Anemia, unspecified: Secondary | ICD-10-CM

## 2016-04-17 DIAGNOSIS — Z79899 Other long term (current) drug therapy: Secondary | ICD-10-CM | POA: Diagnosis not present

## 2016-04-17 DIAGNOSIS — M150 Primary generalized (osteo)arthritis: Secondary | ICD-10-CM | POA: Diagnosis not present

## 2016-04-17 DIAGNOSIS — R739 Hyperglycemia, unspecified: Secondary | ICD-10-CM | POA: Diagnosis not present

## 2016-04-17 DIAGNOSIS — M25552 Pain in left hip: Secondary | ICD-10-CM | POA: Diagnosis not present

## 2016-04-17 DIAGNOSIS — R51 Headache: Secondary | ICD-10-CM

## 2016-04-17 DIAGNOSIS — N183 Chronic kidney disease, stage 3 (moderate): Secondary | ICD-10-CM | POA: Diagnosis not present

## 2016-04-17 DIAGNOSIS — I10 Essential (primary) hypertension: Secondary | ICD-10-CM | POA: Insufficient documentation

## 2016-04-17 DIAGNOSIS — R519 Headache, unspecified: Secondary | ICD-10-CM

## 2016-04-17 DIAGNOSIS — R55 Syncope and collapse: Secondary | ICD-10-CM | POA: Diagnosis not present

## 2016-04-17 LAB — BASIC METABOLIC PANEL
ANION GAP: 8 (ref 5–15)
BUN: 28 mg/dL — ABNORMAL HIGH (ref 6–20)
CALCIUM: 8.8 mg/dL — AB (ref 8.9–10.3)
CO2: 27 mmol/L (ref 22–32)
CREATININE: 1.26 mg/dL — AB (ref 0.44–1.00)
Chloride: 103 mmol/L (ref 101–111)
GFR, EST AFRICAN AMERICAN: 45 mL/min — AB (ref >60)
GFR, EST NON AFRICAN AMERICAN: 39 mL/min — AB (ref >60)
GLUCOSE: 100 mg/dL — AB (ref 65–99)
Potassium: 3.9 mmol/L (ref 3.5–5.1)
Sodium: 138 mmol/L (ref 135–145)

## 2016-04-17 LAB — CBC
HEMATOCRIT: 32.5 % — AB (ref 36.0–46.0)
Hemoglobin: 11 g/dL — ABNORMAL LOW (ref 12.0–15.0)
MCH: 31.5 pg (ref 26.0–34.0)
MCHC: 33.8 g/dL (ref 30.0–36.0)
MCV: 93.1 fL (ref 78.0–100.0)
PLATELETS: 342 10*3/uL (ref 150–400)
RBC: 3.49 MIL/uL — ABNORMAL LOW (ref 3.87–5.11)
RDW: 17 % — ABNORMAL HIGH (ref 11.5–15.5)
WBC: 3.2 10*3/uL — ABNORMAL LOW (ref 4.0–10.5)

## 2016-04-17 LAB — CBG MONITORING, ED: Glucose-Capillary: 80 mg/dL (ref 65–99)

## 2016-04-17 LAB — POC OCCULT BLOOD, ED: Fecal Occult Bld: NEGATIVE

## 2016-04-17 NOTE — Discharge Instructions (Signed)
Follow-up with your primary doctor regarding your anemia, take Tylenol as needed for pain.  Return as needed for worsening symptoms

## 2016-04-17 NOTE — ED Provider Notes (Signed)
[facility] DEPT Provider Note   CSN: [id number] Arrival date & time: 04/17/16  1652     History   Chief Complaint Chief Complaint  Patient presents with  . Headache    HPI Heather Olson is a 82 y.o. female.  HPI Pt had a fainting spell at church this past Sunday.  It was an early service.  She had not eaten.  When she stood up suddenly she got lightheaded and hot. She felt fine the rest of the day after drinking water and did not have a headache.  Since then though  she has been complaining of some headaches.  She has been treating herself with excedrin.  Family checked a sugar at home and it was in the 400s.  Later in the day it was in the 80s without intervention  The headache has mostly been in the front.  No fever.  No vomiting.  No numbness or weakness. Past Medical History:  Diagnosis Date  . Arthritis   . GERD (gastroesophageal reflux disease)   . Headache(784.0)   . Hypertension   . Swelling of both ankles     Patient Active Problem List   Diagnosis Date Noted  . Postop check 06/15/2013  . Cholelithiases 05/11/2013    Past Surgical History:  Procedure Laterality Date  . ABDOMINAL HYSTERECTOMY    . APPENDECTOMY    . CHOLECYSTECTOMY N/A 05/28/2013   Procedure: LAPAROSCOPIC CHOLECYSTECTOMY ;  Surgeon: Gwenyth Ober, MD;  Location: [facility];  Service: General;  Laterality: N/A;  . TONSILLECTOMY      OB History    No data available       Home Medications    Prior to Admission medications   Medication Sig Start Date End Date Taking? Authorizing Provider  aspirin-acetaminophen-caffeine (EXCEDRIN MIGRAINE) [phone] MG tablet Take 2 tablets by mouth every 6 (six) hours as needed for headache.   Yes Historical Provider, MD  azaTHIOprine (IMURAN) 50 MG tablet Take 100 mg by mouth 2 (two) times daily.    Yes Historical Provider, MD  folic acid (FOLVITE) 1 MG tablet Take 1 mg by mouth daily.   Yes Historical Provider, MD  hydrochlorothiazide (HYDRODIURIL) 25  MG tablet Take 25 mg by mouth daily.   Yes Historical Provider, MD  losartan (COZAAR) 50 MG tablet Take 50 mg by mouth daily.   Yes Historical Provider, MD  omeprazole (PRILOSEC) 40 MG capsule Take 40 mg by mouth 2 (two) times daily.    Yes Historical Provider, MD  predniSONE (DELTASONE) 5 MG tablet Take 5 mg by mouth daily.   Yes Historical Provider, MD    Family History Family History  Problem Relation Age of Onset  . Diabetes Mother     Social History Social History  Substance Use Topics  . Smoking status: Never Smoker  . Smokeless tobacco: Never Used  . Alcohol use No     Allergies   Lisinopril   Review of Systems Review of Systems   Physical Exam Updated Vital Signs BP 158/63   Pulse 75   Temp 98.4 F (36.9 C) (Oral)   Resp 18   Ht 5\' 4"  (1.626 m)   Wt 84.4 kg   SpO2 100%   BMI 31.93 kg/m   Physical Exam  Constitutional: She appears well-developed and well-nourished. No distress.  HENT:  Head: Normocephalic and atraumatic.  Right Ear: External ear normal.  Left Ear: External ear normal.  Eyes: Conjunctivae are normal. Right eye exhibits no discharge. Left eye exhibits  no discharge. No scleral icterus.  Neck: Neck supple. No tracheal deviation present.  Cardiovascular: Normal rate, regular rhythm and intact distal pulses.   Pulmonary/Chest: Effort normal and breath sounds normal. No stridor. No respiratory distress. She has no wheezes. She has no rales.  Abdominal: Soft. Bowel sounds are normal. She exhibits no distension. There is no tenderness. There is no rebound and no guarding.  Musculoskeletal: She exhibits no edema or tenderness.  Neurological: She is alert. She has normal strength. No cranial nerve deficit (no facial droop, extraocular movements intact, no slurred speech) or sensory deficit. She exhibits normal muscle tone. She displays no seizure activity. Coordination normal.  Skin: Skin is warm and dry. No rash noted.  Psychiatric: She has a  normal mood and affect.  Nursing note and vitals reviewed.    ED Treatments / Results  Labs (all labs ordered are listed, but only abnormal results are displayed) Labs Reviewed  CBC - Abnormal; Notable for the following:       Result Value   WBC 3.2 (*)    RBC 3.49 (*)    Hemoglobin 11.0 (*)    HCT 32.5 (*)    RDW 17.0 (*)    All other components within normal limits  BASIC METABOLIC PANEL - Abnormal; Notable for the following:    Glucose, Bld 100 (*)    BUN 28 (*)    Creatinine, Ser 1.26 (*)    Calcium 8.8 (*)    GFR calc non Af Amer 39 (*)    GFR calc Af Amer 45 (*)    All other components within normal limits  CBG MONITORING, ED  POC OCCULT BLOOD, ED    EKG  EKG Interpretation  Date/Time:  Wednesday April 17 2016 19:23:03 EDT Ventricular Rate:  59 PR Interval:    QRS Duration: 85 QT Interval:  438 QTC Calculation: 434 R Axis:   0 Text Interpretation:  Sinus rhythm LVH by voltage nonspecific t wave changes on prior ECG not noted on current ECG Confirmed by Jason Hauge  MD-J, Chardonnay Holzmann ([id number]) on 04/17/2016 7:35:38 PM       Radiology Ct Head Wo Contrast  Result Date: 04/17/2016 CLINICAL DATA:  Syncopal episode on Sunday. EXAM: CT HEAD WITHOUT CONTRAST TECHNIQUE: Contiguous axial images were obtained from the base of the skull through the vertex without intravenous contrast. COMPARISON:  None. FINDINGS: Brain: Age related cerebral atrophy, ventriculomegaly and periventricular white matter disease. No extra-axial fluid collections are identified. No CT findings for acute hemispheric infarction or intracranial hemorrhage. No mass lesions. The brainstem and cerebellum are normal. Vascular: Vascular calcifications but no definite aneurysm or worrisome hyperdense vessels. Skull: No skull fracture or bone lesion. Sinuses/Orbits: The paranasal sinuses and mastoid air cells are clear. The globes are intact. Other: No scalp hematoma or laceration. IMPRESSION: Age related cerebral  atrophy, ventriculomegaly and periventricular white matter disease. No acute intracranial findings or mass lesions. Electronically Signed   By: Marijo Sanes M.D.   On: 04/17/2016 20:40    Procedures Procedures (including critical care time)  Medications Ordered in ED Medications - No data to display   Initial Impression / Assessment and Plan / ED Course  I have reviewed the triage vital signs and the nursing notes.  Pertinent labs & imaging results that were available during my care of the patient were reviewed by me and considered in my medical decision making (see chart for details).  Clinical Course  Comment By Time  Discussed findings with family.  Anemia is new compared to old labs.  Hemoccult negative.  NO acute findings on CT scan.  Doubt stroke, hemorrhage, aneurysm.  Stable for outpatient follow up Dorie Rank, MD 09/20 2148  Stable renal insufficiency Dorie Rank, MD 09/20 2150   Discussed outpatient follow up with her PCP.  Return as needed for worsening symptoms  Final Clinical Impressions(s) / ED Diagnoses   Final diagnoses:  Acute nonintractable headache, unspecified headache type  Anemia, unspecified anemia type       Dorie Rank, MD 04/17/16 2153

## 2016-04-17 NOTE — ED Triage Notes (Signed)
Pt reports she felt dizzy and had a syncopal episode Sunday while she was in church.  Today, her son reports today, her BG and it was over 400, went to see her Rheumatologist.  Was instructed to go home and check it again before going to the ED.  IT was 92.  Pt is not taking any meds for Blood sugar pt is borderline diabetic.  Pt is A&Ox 4.  Pt reports R side H/A.

## 2016-04-22 DIAGNOSIS — M6281 Muscle weakness (generalized): Secondary | ICD-10-CM | POA: Diagnosis not present

## 2016-04-22 DIAGNOSIS — M545 Low back pain: Secondary | ICD-10-CM | POA: Diagnosis not present

## 2016-04-22 DIAGNOSIS — M5416 Radiculopathy, lumbar region: Secondary | ICD-10-CM | POA: Diagnosis not present

## 2016-04-24 DIAGNOSIS — M5416 Radiculopathy, lumbar region: Secondary | ICD-10-CM | POA: Diagnosis not present

## 2016-04-24 DIAGNOSIS — M6281 Muscle weakness (generalized): Secondary | ICD-10-CM | POA: Diagnosis not present

## 2016-04-24 DIAGNOSIS — M545 Low back pain: Secondary | ICD-10-CM | POA: Diagnosis not present

## 2016-04-25 DIAGNOSIS — Y9342 Activity, yoga: Secondary | ICD-10-CM

## 2016-04-25 NOTE — Congregational Nurse Program (Signed)
Attended chair yoga class.Congregational Nurse Program Note  Date of Encounter: 04/25/2016  Past Medical History: Past Medical History:  Diagnosis Date  . Arthritis   . GERD (gastroesophageal reflux disease)   . Headache(784.0)   . Hypertension   . Swelling of both ankles     Encounter Details:     CNP Questionnaire - 04/25/16 1832      Patient Demographics   Is this a new or existing patient? Existing   Patient is considered a/an Not Applicable   Race African-American/Black     Patient Assistance   Location of Patient Assistance Not Applicable   Patient's financial/insurance status Medicare   Uninsured Patient No   Patient referred to apply for the following financial assistance Not Applicable   Food insecurities addressed Not Applicable   Transportation assistance No   Assistance securing medications No   Educational health offerings Exercise/physical activity     Encounter Details   Primary purpose of visit Education/Health Concerns   Was an Emergency Department visit averted? Not Applicable   Does patient have a medical provider? Yes   Patient referred to Not Applicable   Was a mental health screening completed? (GAINS tool) No   Does patient have dental issues? No   Does patient have vision issues? No   Does your patient have an abnormal blood pressure today? No   Since previous encounter, have you referred patient for abnormal blood pressure that resulted in a new diagnosis or medication change? No   Does your patient have an abnormal blood glucose today? No   Since previous encounter, have you referred patient for abnormal blood glucose that resulted in a new diagnosis or medication change? No   Was there a life-saving intervention made? No

## 2016-04-26 DIAGNOSIS — R0609 Other forms of dyspnea: Secondary | ICD-10-CM | POA: Diagnosis not present

## 2016-04-26 DIAGNOSIS — D649 Anemia, unspecified: Secondary | ICD-10-CM | POA: Diagnosis not present

## 2016-04-26 DIAGNOSIS — E119 Type 2 diabetes mellitus without complications: Secondary | ICD-10-CM | POA: Diagnosis not present

## 2016-04-29 DIAGNOSIS — D649 Anemia, unspecified: Secondary | ICD-10-CM | POA: Diagnosis not present

## 2016-04-29 DIAGNOSIS — R0609 Other forms of dyspnea: Secondary | ICD-10-CM | POA: Diagnosis not present

## 2016-04-29 DIAGNOSIS — L680 Hirsutism: Secondary | ICD-10-CM | POA: Diagnosis not present

## 2016-04-29 DIAGNOSIS — I10 Essential (primary) hypertension: Secondary | ICD-10-CM | POA: Diagnosis not present

## 2016-04-29 DIAGNOSIS — R61 Generalized hyperhidrosis: Secondary | ICD-10-CM | POA: Diagnosis not present

## 2016-04-29 DIAGNOSIS — R0600 Dyspnea, unspecified: Secondary | ICD-10-CM | POA: Diagnosis not present

## 2016-04-29 DIAGNOSIS — Z713 Dietary counseling and surveillance: Secondary | ICD-10-CM | POA: Diagnosis not present

## 2016-04-29 DIAGNOSIS — R05 Cough: Secondary | ICD-10-CM | POA: Diagnosis not present

## 2016-04-29 DIAGNOSIS — R7303 Prediabetes: Secondary | ICD-10-CM | POA: Diagnosis not present

## 2016-04-29 DIAGNOSIS — E119 Type 2 diabetes mellitus without complications: Secondary | ICD-10-CM | POA: Diagnosis not present

## 2016-04-29 DIAGNOSIS — R42 Dizziness and giddiness: Secondary | ICD-10-CM | POA: Diagnosis not present

## 2016-04-29 DIAGNOSIS — M3320 Polymyositis, organ involvement unspecified: Secondary | ICD-10-CM | POA: Diagnosis not present

## 2016-05-01 DIAGNOSIS — M5416 Radiculopathy, lumbar region: Secondary | ICD-10-CM | POA: Diagnosis not present

## 2016-05-01 DIAGNOSIS — M545 Low back pain: Secondary | ICD-10-CM | POA: Diagnosis not present

## 2016-05-01 DIAGNOSIS — M6281 Muscle weakness (generalized): Secondary | ICD-10-CM | POA: Diagnosis not present

## 2016-05-07 DIAGNOSIS — Y9342 Activity, yoga: Secondary | ICD-10-CM

## 2016-05-07 NOTE — Congregational Nurse Program (Signed)
Congregational Nurse Program Note  Date of Encounter: 05/07/2016  Past Medical History: Past Medical History:  Diagnosis Date  . Arthritis   . GERD (gastroesophageal reflux disease)   . Headache(784.0)   . Hypertension   . Swelling of both ankles     Encounter Details:     CNP Questionnaire - 05/07/16 1112      Patient Demographics   Is this a new or existing patient? Existing   Patient is considered a/an Not Applicable   Race African-American/Black     Patient Assistance   Location of Patient Assistance Not Applicable   Patient's financial/insurance status Medicare   Uninsured Patient No   Patient referred to apply for the following financial assistance Not Applicable   Food insecurities addressed Not Applicable   Transportation assistance No   Assistance securing medications No   Educational health offerings Exercise/physical activity     Encounter Details   Primary purpose of visit Education/Health Concerns   Was an Emergency Department visit averted? Not Applicable   Does patient have a medical provider? Yes   Patient referred to Not Applicable   Was a mental health screening completed? (GAINS tool) No   Does patient have dental issues? No   Does patient have vision issues? No   Does your patient have an abnormal blood pressure today? No   Since previous encounter, have you referred patient for abnormal blood pressure that resulted in a new diagnosis or medication change? No   Does your patient have an abnormal blood glucose today? No   Since previous encounter, have you referred patient for abnormal blood glucose that resulted in a new diagnosis or medication change? No   Was there a life-saving intervention made? No    Attended chair yoga class.

## 2016-05-10 DIAGNOSIS — R55 Syncope and collapse: Secondary | ICD-10-CM | POA: Diagnosis not present

## 2016-05-10 DIAGNOSIS — I6789 Other cerebrovascular disease: Secondary | ICD-10-CM | POA: Diagnosis not present

## 2016-05-13 DIAGNOSIS — M6281 Muscle weakness (generalized): Secondary | ICD-10-CM | POA: Diagnosis not present

## 2016-05-13 DIAGNOSIS — M545 Low back pain: Secondary | ICD-10-CM | POA: Diagnosis not present

## 2016-05-13 DIAGNOSIS — M5416 Radiculopathy, lumbar region: Secondary | ICD-10-CM | POA: Diagnosis not present

## 2016-05-17 DIAGNOSIS — R55 Syncope and collapse: Secondary | ICD-10-CM | POA: Diagnosis not present

## 2016-05-30 DIAGNOSIS — R55 Syncope and collapse: Secondary | ICD-10-CM | POA: Diagnosis not present

## 2016-06-03 DIAGNOSIS — R7303 Prediabetes: Secondary | ICD-10-CM | POA: Diagnosis not present

## 2016-06-03 DIAGNOSIS — I6789 Other cerebrovascular disease: Secondary | ICD-10-CM | POA: Diagnosis not present

## 2016-06-03 DIAGNOSIS — L659 Nonscarring hair loss, unspecified: Secondary | ICD-10-CM | POA: Diagnosis not present

## 2016-06-03 DIAGNOSIS — R42 Dizziness and giddiness: Secondary | ICD-10-CM | POA: Diagnosis not present

## 2016-06-03 DIAGNOSIS — R946 Abnormal results of thyroid function studies: Secondary | ICD-10-CM | POA: Diagnosis not present

## 2016-06-03 DIAGNOSIS — R55 Syncope and collapse: Secondary | ICD-10-CM | POA: Diagnosis not present

## 2016-06-03 DIAGNOSIS — L680 Hirsutism: Secondary | ICD-10-CM | POA: Diagnosis not present

## 2016-06-03 DIAGNOSIS — R61 Generalized hyperhidrosis: Secondary | ICD-10-CM | POA: Diagnosis not present

## 2016-06-03 DIAGNOSIS — R05 Cough: Secondary | ICD-10-CM | POA: Diagnosis not present

## 2016-06-04 DIAGNOSIS — M65311 Trigger thumb, right thumb: Secondary | ICD-10-CM | POA: Diagnosis not present

## 2016-06-10 DIAGNOSIS — R0602 Shortness of breath: Secondary | ICD-10-CM | POA: Diagnosis not present

## 2016-06-10 DIAGNOSIS — R55 Syncope and collapse: Secondary | ICD-10-CM | POA: Diagnosis not present

## 2016-06-10 DIAGNOSIS — G44209 Tension-type headache, unspecified, not intractable: Secondary | ICD-10-CM | POA: Diagnosis not present

## 2016-06-10 DIAGNOSIS — Z862 Personal history of diseases of the blood and blood-forming organs and certain disorders involving the immune mechanism: Secondary | ICD-10-CM | POA: Diagnosis not present

## 2016-06-15 NOTE — Congregational Nurse Program (Signed)
Congregational Nurse Program Note  Date of Encounter: 06/15/2016  Past Medical History: Past Medical History:  Diagnosis Date  . Arthritis   . GERD (gastroesophageal reflux disease)   . Headache(784.0)   . Hypertension   . Swelling of both ankles     Encounter Details:     CNP Questionnaire - 06/15/16 1304      Patient Demographics   Is this a new or existing patient? Existing   Patient is considered a/an Not Applicable   Race African-American/Black     Patient Assistance   Location of Patient Assistance Not Applicable   Patient's financial/insurance status Medicare   Uninsured Patient ([facility]/[employer]) No   Patient referred to apply for the following financial assistance Not Applicable   Food insecurities addressed Not Applicable   Transportation assistance No   Assistance securing medications No   Educational health offerings Exercise/physical activity     Encounter Details   Primary purpose of visit Education/Health Concerns   Was an Emergency Department visit averted? Not Applicable   Does patient have a medical provider? Yes   Patient referred to Not Applicable   Was a mental health screening completed? (GAINS tool) No   Does patient have dental issues? No   Does patient have vision issues? No   Does your patient have an abnormal blood pressure today? No   Since previous encounter, have you referred patient for abnormal blood pressure that resulted in a new diagnosis or medication change? No   Does your patient have an abnormal blood glucose today? No   Since previous encounter, have you referred patient for abnormal blood glucose that resulted in a new diagnosis or medication change? No   Was there a life-saving intervention made? No    Attended chair yoga class.

## 2016-06-24 DIAGNOSIS — R55 Syncope and collapse: Secondary | ICD-10-CM | POA: Diagnosis not present

## 2016-06-24 DIAGNOSIS — R0602 Shortness of breath: Secondary | ICD-10-CM | POA: Diagnosis not present

## 2016-07-04 DIAGNOSIS — I10 Essential (primary) hypertension: Secondary | ICD-10-CM | POA: Diagnosis not present

## 2016-07-04 DIAGNOSIS — R0602 Shortness of breath: Secondary | ICD-10-CM | POA: Diagnosis not present

## 2016-07-04 DIAGNOSIS — R42 Dizziness and giddiness: Secondary | ICD-10-CM | POA: Diagnosis not present

## 2016-07-04 DIAGNOSIS — D649 Anemia, unspecified: Secondary | ICD-10-CM | POA: Diagnosis not present

## 2016-07-17 DIAGNOSIS — R42 Dizziness and giddiness: Secondary | ICD-10-CM | POA: Diagnosis not present

## 2016-07-17 DIAGNOSIS — R7303 Prediabetes: Secondary | ICD-10-CM | POA: Diagnosis not present

## 2016-07-17 DIAGNOSIS — R61 Generalized hyperhidrosis: Secondary | ICD-10-CM | POA: Diagnosis not present

## 2016-07-25 DIAGNOSIS — R739 Hyperglycemia, unspecified: Secondary | ICD-10-CM | POA: Diagnosis not present

## 2016-07-25 DIAGNOSIS — M150 Primary generalized (osteo)arthritis: Secondary | ICD-10-CM | POA: Diagnosis not present

## 2016-07-25 DIAGNOSIS — N183 Chronic kidney disease, stage 3 (moderate): Secondary | ICD-10-CM | POA: Diagnosis not present

## 2016-07-25 DIAGNOSIS — M25552 Pain in left hip: Secondary | ICD-10-CM | POA: Diagnosis not present

## 2016-07-25 DIAGNOSIS — E669 Obesity, unspecified: Secondary | ICD-10-CM | POA: Diagnosis not present

## 2016-07-25 DIAGNOSIS — Z7952 Long term (current) use of systemic steroids: Secondary | ICD-10-CM | POA: Diagnosis not present

## 2016-07-25 DIAGNOSIS — M3322 Polymyositis with myopathy: Secondary | ICD-10-CM | POA: Diagnosis not present

## 2016-07-25 DIAGNOSIS — Z79899 Other long term (current) drug therapy: Secondary | ICD-10-CM | POA: Diagnosis not present

## 2016-07-25 DIAGNOSIS — Z6834 Body mass index (BMI) 34.0-34.9, adult: Secondary | ICD-10-CM | POA: Diagnosis not present

## 2016-07-30 DIAGNOSIS — H35033 Hypertensive retinopathy, bilateral: Secondary | ICD-10-CM | POA: Diagnosis not present

## 2016-07-30 DIAGNOSIS — Z961 Presence of intraocular lens: Secondary | ICD-10-CM | POA: Diagnosis not present

## 2016-07-30 DIAGNOSIS — H04123 Dry eye syndrome of bilateral lacrimal glands: Secondary | ICD-10-CM | POA: Diagnosis not present

## 2016-08-05 DIAGNOSIS — R42 Dizziness and giddiness: Secondary | ICD-10-CM | POA: Diagnosis not present

## 2016-08-05 DIAGNOSIS — H903 Sensorineural hearing loss, bilateral: Secondary | ICD-10-CM | POA: Diagnosis not present

## 2016-08-05 DIAGNOSIS — Z974 Presence of external hearing-aid: Secondary | ICD-10-CM | POA: Diagnosis not present

## 2016-08-09 DIAGNOSIS — R55 Syncope and collapse: Secondary | ICD-10-CM | POA: Diagnosis not present

## 2016-08-09 DIAGNOSIS — R0602 Shortness of breath: Secondary | ICD-10-CM | POA: Diagnosis not present

## 2016-08-09 DIAGNOSIS — Z7689 Persons encountering health services in other specified circumstances: Secondary | ICD-10-CM | POA: Diagnosis not present

## 2016-08-09 DIAGNOSIS — I951 Orthostatic hypotension: Secondary | ICD-10-CM | POA: Diagnosis not present

## 2016-08-28 DIAGNOSIS — Z862 Personal history of diseases of the blood and blood-forming organs and certain disorders involving the immune mechanism: Secondary | ICD-10-CM | POA: Diagnosis not present

## 2016-08-30 DIAGNOSIS — R55 Syncope and collapse: Secondary | ICD-10-CM | POA: Diagnosis not present

## 2016-09-05 DIAGNOSIS — H903 Sensorineural hearing loss, bilateral: Secondary | ICD-10-CM | POA: Diagnosis not present

## 2016-09-26 NOTE — Congregational Nurse Program (Signed)
Congregational Nurse Program Note  Date of Encounter: 09/26/2016  Past Medical History: Past Medical History:  Diagnosis Date  . Arthritis   . GERD (gastroesophageal reflux disease)   . Headache(784.0)   . Hypertension   . Swelling of both ankles     Encounter Details:     CNP Questionnaire - 09/26/16 1123      Patient Demographics   Is this a new or existing patient? Existing   Patient is considered a/an Not Applicable   Race African-American/Black     Patient Assistance   Location of Patient Assistance Not Applicable   Patient's financial/insurance status Medicare   Uninsured Patient ([facility]/[employer] Connects) No   Patient referred to apply for the following financial assistance Not Applicable   Food insecurities addressed Not Applicable   Transportation assistance No   Assistance securing medications No   Educational health offerings Exercise/physical activity     Encounter Details   Primary purpose of visit Education/Health Concerns   Was an Emergency Department visit averted? Not Applicable   Does patient have a medical provider? Yes   Patient referred to Not Applicable   Was a mental health screening completed? (GAINS tool) No   Does patient have dental issues? No   Does patient have vision issues? No   Does your patient have an abnormal blood pressure today? No   Since previous encounter, have you referred patient for abnormal blood pressure that resulted in a new diagnosis or medication change? No   Does your patient have an abnormal blood glucose today? No   Since previous encounter, have you referred patient for abnormal blood glucose that resulted in a new diagnosis or medication change? No   Was there a life-saving intervention made? No    Attended chair yoga class.

## 2016-10-21 NOTE — Congregational Nurse Program (Signed)
Congregational Nurse Program Note  Date of Encounter: 10/21/2016  Past Medical History: Past Medical History:  Diagnosis Date  . Arthritis   . GERD (gastroesophageal reflux disease)   . Headache(784.0)   . Hypertension   . Swelling of both ankles     Encounter Details:     CNP Questionnaire - 10/21/16 1746      Patient Demographics   Is this a new or existing patient? Existing   Patient is considered a/an Not Applicable   Race African-American/Black     Patient Assistance   Location of Patient Assistance Not Applicable   Patient's financial/insurance status Medicare   Uninsured Patient ([facility] [employer]s) No   Patient referred to apply for the following financial assistance Not Applicable   Food insecurities addressed Not Applicable   Transportation assistance No   Assistance securing medications No   Educational health offerings Exercise/physical activity     Encounter Details   Primary purpose of visit Education/Health Concerns   Was an Emergency Department visit averted? Not Applicable   Does patient have a medical provider? Yes   Patient referred to Not Applicable   Was a mental health screening completed? (GAINS tool) No   Does patient have dental issues? No   Does patient have vision issues? No   Does your patient have an abnormal blood pressure today? No   Since previous encounter, have you referred patient for abnormal blood pressure that resulted in a new diagnosis or medication change? No   Does your patient have an abnormal blood glucose today? No   Since previous encounter, have you referred patient for abnormal blood glucose that resulted in a new diagnosis or medication change? No   Was there a life-saving intervention made? No    Attended chair yoga class

## 2016-10-23 DIAGNOSIS — Z862 Personal history of diseases of the blood and blood-forming organs and certain disorders involving the immune mechanism: Secondary | ICD-10-CM | POA: Diagnosis not present

## 2016-10-23 DIAGNOSIS — M79671 Pain in right foot: Secondary | ICD-10-CM | POA: Diagnosis not present

## 2016-10-23 DIAGNOSIS — R1011 Right upper quadrant pain: Secondary | ICD-10-CM | POA: Diagnosis not present

## 2016-10-23 DIAGNOSIS — R05 Cough: Secondary | ICD-10-CM | POA: Diagnosis not present

## 2016-10-23 DIAGNOSIS — M79672 Pain in left foot: Secondary | ICD-10-CM | POA: Diagnosis not present

## 2016-10-23 DIAGNOSIS — H903 Sensorineural hearing loss, bilateral: Secondary | ICD-10-CM | POA: Diagnosis not present

## 2016-10-23 DIAGNOSIS — E119 Type 2 diabetes mellitus without complications: Secondary | ICD-10-CM | POA: Diagnosis not present

## 2016-10-31 DIAGNOSIS — E669 Obesity, unspecified: Secondary | ICD-10-CM | POA: Diagnosis not present

## 2016-10-31 DIAGNOSIS — N183 Chronic kidney disease, stage 3 (moderate): Secondary | ICD-10-CM | POA: Diagnosis not present

## 2016-10-31 DIAGNOSIS — Z6834 Body mass index (BMI) 34.0-34.9, adult: Secondary | ICD-10-CM | POA: Diagnosis not present

## 2016-10-31 DIAGNOSIS — M25552 Pain in left hip: Secondary | ICD-10-CM | POA: Diagnosis not present

## 2016-10-31 DIAGNOSIS — M150 Primary generalized (osteo)arthritis: Secondary | ICD-10-CM | POA: Diagnosis not present

## 2016-10-31 DIAGNOSIS — Z79899 Other long term (current) drug therapy: Secondary | ICD-10-CM | POA: Diagnosis not present

## 2016-10-31 DIAGNOSIS — Z7952 Long term (current) use of systemic steroids: Secondary | ICD-10-CM | POA: Diagnosis not present

## 2016-10-31 DIAGNOSIS — M3322 Polymyositis with myopathy: Secondary | ICD-10-CM | POA: Diagnosis not present

## 2016-10-31 DIAGNOSIS — R739 Hyperglycemia, unspecified: Secondary | ICD-10-CM | POA: Diagnosis not present

## 2016-11-03 DIAGNOSIS — M545 Low back pain: Secondary | ICD-10-CM | POA: Diagnosis not present

## 2016-11-03 DIAGNOSIS — I10 Essential (primary) hypertension: Secondary | ICD-10-CM | POA: Diagnosis not present

## 2016-11-03 DIAGNOSIS — S8391XA Sprain of unspecified site of right knee, initial encounter: Secondary | ICD-10-CM | POA: Diagnosis not present

## 2016-11-06 ENCOUNTER — Other Ambulatory Visit: Payer: Self-pay | Admitting: Physician Assistant

## 2016-11-06 DIAGNOSIS — Z7952 Long term (current) use of systemic steroids: Secondary | ICD-10-CM

## 2016-11-14 ENCOUNTER — Ambulatory Visit
Admission: RE | Admit: 2016-11-14 | Discharge: 2016-11-14 | Disposition: A | Discharge status: Home / Self Care | Payer: Medicare Other | Source: Ambulatory Visit | Attending: Physician Assistant | Admitting: Physician Assistant

## 2016-11-14 DIAGNOSIS — Z780 Asymptomatic menopausal state: Secondary | ICD-10-CM | POA: Diagnosis not present

## 2016-11-14 DIAGNOSIS — Z13820 Encounter for screening for osteoporosis: Secondary | ICD-10-CM | POA: Diagnosis not present

## 2016-11-14 DIAGNOSIS — Z7952 Long term (current) use of systemic steroids: Secondary | ICD-10-CM

## 2016-11-21 DIAGNOSIS — M25561 Pain in right knee: Secondary | ICD-10-CM | POA: Diagnosis not present

## 2016-11-21 DIAGNOSIS — M25562 Pain in left knee: Secondary | ICD-10-CM | POA: Diagnosis not present

## 2016-12-04 DIAGNOSIS — M19072 Primary osteoarthritis, left ankle and foot: Secondary | ICD-10-CM | POA: Diagnosis not present

## 2016-12-04 DIAGNOSIS — M76822 Posterior tibial tendinitis, left leg: Secondary | ICD-10-CM | POA: Diagnosis not present

## 2016-12-04 DIAGNOSIS — M21272 Flexion deformity, left ankle and toes: Secondary | ICD-10-CM | POA: Diagnosis not present

## 2016-12-04 DIAGNOSIS — M24572 Contracture, left ankle: Secondary | ICD-10-CM | POA: Diagnosis not present

## 2016-12-06 DIAGNOSIS — Z1231 Encounter for screening mammogram for malignant neoplasm of breast: Secondary | ICD-10-CM | POA: Diagnosis not present

## 2016-12-06 DIAGNOSIS — M1712 Unilateral primary osteoarthritis, left knee: Secondary | ICD-10-CM | POA: Diagnosis not present

## 2016-12-06 DIAGNOSIS — M25461 Effusion, right knee: Secondary | ICD-10-CM | POA: Diagnosis not present

## 2016-12-06 DIAGNOSIS — M25561 Pain in right knee: Secondary | ICD-10-CM | POA: Diagnosis not present

## 2016-12-06 DIAGNOSIS — M1711 Unilateral primary osteoarthritis, right knee: Secondary | ICD-10-CM | POA: Diagnosis not present

## 2016-12-06 DIAGNOSIS — M25562 Pain in left knee: Secondary | ICD-10-CM | POA: Diagnosis not present

## 2016-12-20 DIAGNOSIS — E119 Type 2 diabetes mellitus without complications: Secondary | ICD-10-CM | POA: Diagnosis not present

## 2017-01-01 DIAGNOSIS — M3322 Polymyositis with myopathy: Secondary | ICD-10-CM | POA: Diagnosis not present

## 2017-01-01 DIAGNOSIS — M25472 Effusion, left ankle: Secondary | ICD-10-CM | POA: Diagnosis not present

## 2017-01-01 DIAGNOSIS — Z7952 Long term (current) use of systemic steroids: Secondary | ICD-10-CM | POA: Diagnosis not present

## 2017-01-01 DIAGNOSIS — Z79899 Other long term (current) drug therapy: Secondary | ICD-10-CM | POA: Diagnosis not present

## 2017-01-01 DIAGNOSIS — M7989 Other specified soft tissue disorders: Secondary | ICD-10-CM | POA: Diagnosis not present

## 2017-01-01 DIAGNOSIS — N183 Chronic kidney disease, stage 3 (moderate): Secondary | ICD-10-CM | POA: Diagnosis not present

## 2017-01-01 DIAGNOSIS — E669 Obesity, unspecified: Secondary | ICD-10-CM | POA: Diagnosis not present

## 2017-01-01 DIAGNOSIS — Z6834 Body mass index (BMI) 34.0-34.9, adult: Secondary | ICD-10-CM | POA: Diagnosis not present

## 2017-01-01 DIAGNOSIS — M150 Primary generalized (osteo)arthritis: Secondary | ICD-10-CM | POA: Diagnosis not present

## 2017-01-09 ENCOUNTER — Other Ambulatory Visit: Payer: Self-pay | Admitting: Physician Assistant

## 2017-01-09 DIAGNOSIS — M25472 Effusion, left ankle: Secondary | ICD-10-CM

## 2017-01-09 DIAGNOSIS — M170 Bilateral primary osteoarthritis of knee: Secondary | ICD-10-CM | POA: Diagnosis not present

## 2017-01-15 DIAGNOSIS — M48062 Spinal stenosis, lumbar region with neurogenic claudication: Secondary | ICD-10-CM | POA: Diagnosis not present

## 2017-01-15 DIAGNOSIS — M4316 Spondylolisthesis, lumbar region: Secondary | ICD-10-CM | POA: Diagnosis not present

## 2017-01-25 ENCOUNTER — Ambulatory Visit
Admission: RE | Admit: 2017-01-25 | Discharge: 2017-01-25 | Disposition: A | Discharge status: Home / Self Care | Payer: Medicare Other | Source: Ambulatory Visit | Attending: Physician Assistant | Admitting: Physician Assistant

## 2017-01-25 DIAGNOSIS — M25472 Effusion, left ankle: Secondary | ICD-10-CM

## 2017-01-25 DIAGNOSIS — M19072 Primary osteoarthritis, left ankle and foot: Secondary | ICD-10-CM | POA: Diagnosis not present

## 2017-02-12 DIAGNOSIS — M79671 Pain in right foot: Secondary | ICD-10-CM | POA: Diagnosis not present

## 2017-02-12 DIAGNOSIS — M79672 Pain in left foot: Secondary | ICD-10-CM | POA: Diagnosis not present

## 2017-02-12 DIAGNOSIS — R61 Generalized hyperhidrosis: Secondary | ICD-10-CM | POA: Diagnosis not present

## 2017-02-12 DIAGNOSIS — E118 Type 2 diabetes mellitus with unspecified complications: Secondary | ICD-10-CM | POA: Diagnosis not present

## 2017-02-27 NOTE — Congregational Nurse Program (Signed)
Congregational Nurse Program Note  Date of Encounter: 02/27/2017  Past Medical History: Past Medical History:  Diagnosis Date  . Arthritis   . GERD (gastroesophageal reflux disease)   . Headache(784.0)   . Hypertension   . Swelling of both ankles     Encounter Details:     CNP Questionnaire - 02/27/17 1347      Patient Demographics   Is this a new or existing patient? Existing   Patient is considered a/an Not Applicable   Race African-American/Black     Patient Assistance   Location of Patient Assistance Not Applicable   Patient's financial/insurance status Medicare   Uninsured Patient ([facility] [employer] Connects) No   Patient referred to apply for the following financial assistance Not Applicable   Food insecurities addressed Not Applicable   Transportation assistance No   Assistance securing medications No   Educational health offerings Exercise/physical activity     Encounter Details   Primary purpose of visit Education/Health Concerns   Was an Emergency Department visit averted? Not Applicable   Does patient have a medical provider? Yes   Patient referred to Not Applicable   Was a mental health screening completed? (GAINS tool) No   Does patient have dental issues? No   Does patient have vision issues? No   Does your patient have an abnormal blood pressure today? No   Since previous encounter, have you referred patient for abnormal blood pressure that resulted in a new diagnosis or medication change? No   Does your patient have an abnormal blood glucose today? No   Since previous encounter, have you referred patient for abnormal blood glucose that resulted in a new diagnosis or medication change? No   Was there a life-saving intervention made? No    Attended chair yoga class.

## 2017-03-05 ENCOUNTER — Ambulatory Visit (INDEPENDENT_AMBULATORY_CARE_PROVIDER_SITE_OTHER): Payer: Medicare Other

## 2017-03-05 ENCOUNTER — Encounter: Payer: Self-pay | Admitting: Podiatry

## 2017-03-05 ENCOUNTER — Ambulatory Visit (INDEPENDENT_AMBULATORY_CARE_PROVIDER_SITE_OTHER): Payer: Medicare Other | Admitting: Podiatry

## 2017-03-05 DIAGNOSIS — M79672 Pain in left foot: Secondary | ICD-10-CM | POA: Diagnosis not present

## 2017-03-05 DIAGNOSIS — M76822 Posterior tibial tendinitis, left leg: Secondary | ICD-10-CM | POA: Diagnosis not present

## 2017-03-05 DIAGNOSIS — M779 Enthesopathy, unspecified: Secondary | ICD-10-CM | POA: Diagnosis not present

## 2017-03-05 DIAGNOSIS — M79671 Pain in right foot: Secondary | ICD-10-CM

## 2017-03-05 MED ORDER — TRIAMCINOLONE ACETONIDE 10 MG/ML IJ SUSP
10.0000 mg | Freq: Once | INTRAMUSCULAR | Status: AC
  Administered 2017-03-05: 10 mg

## 2017-03-05 NOTE — Progress Notes (Signed)
Subjective:    Patient ID: Heather Olson, female   DOB: 83 y.o.   MRN: [id number]   HPI patient presents stating that she's had chronic flatfoot deformity and did have previous attempts to treat with minimal success and has been offered complete reconstruction of the foot but is not interested this due to advanced age    ROS      Objective:  Physical Exam neurovascular status was found to be intact with patient noted to have extreme flatfoot deformity left with inflammation fluid that buildup around the posterior tibial tendon at its insertion into the navicular and obvious signs of dysfunction of the tendon group     Assessment:    Significant posterior tibial tendon dysfunction left over right with acute tendinitis of the sheath of the tendon     Plan:    H&P on conditions reviewed at great length. I discussed the dysfunction of the mid arch area and the tendon associated with it in a difference to the inflammation of the sheath of the posterior tibial tendon. At this point we do need to try some kind of brace therapy and she will be seen by head orthotist for consideration of AFO or customized orthotic. Careful sheath injection administered today 3 mg Kenalog 5 mill grams Xylocaine with understanding of rupture and fascial brace was dispensed in order try to lift the arch up. Reappoint for evaluation by the head orthotist   X-rays indicate significant collapse medial longitudinal arch left over right

## 2017-03-05 NOTE — Progress Notes (Signed)
   Subjective:    Patient ID: Heather Olson, female    DOB: 10-17-1932, 83 y.o.   MRN: [id number]  HPI  Chief Complaint  Patient presents with  . Foot Orthotics    Needs new orthotics for Lt foot       Review of Systems  Eyes: Positive for itching.  Musculoskeletal: Positive for back pain and myalgias.  Skin:       Change in nails  Neurological: Positive for headaches.  All other systems reviewed and are negative.      Objective:   Physical Exam        Assessment & Plan:

## 2017-03-13 ENCOUNTER — Ambulatory Visit: Payer: Medicare Other | Admitting: Orthotics

## 2017-03-13 DIAGNOSIS — M779 Enthesopathy, unspecified: Secondary | ICD-10-CM

## 2017-03-13 DIAGNOSIS — M79672 Pain in left foot: Principal | ICD-10-CM

## 2017-03-13 DIAGNOSIS — M79671 Pain in right foot: Secondary | ICD-10-CM

## 2017-03-13 DIAGNOSIS — M76822 Posterior tibial tendinitis, left leg: Secondary | ICD-10-CM

## 2017-03-13 NOTE — Progress Notes (Signed)
Patient was evaluated for AFO today per Dr. Mellody Drown request.   Patient has pes planus and tendinitis along PTT, especially discomfort at palpation navicular insertion.   Recommended AFO:  PTTD,  Breeze, lace/velcro black, 9.5   Patient tolerated casting without any issue.

## 2017-04-01 NOTE — Congregational Nurse Program (Signed)
Congregational Nurse Program Note  Date of Encounter: 04/01/2017  Past Medical History: Past Medical History:  Diagnosis Date  . Arthritis   . GERD (gastroesophageal reflux disease)   . Headache(784.0)   . Hypertension   . Swelling of both ankles     Encounter Details:     CNP Questionnaire - 04/01/17 1526      Patient Demographics   Is this a new or existing patient? Existing   Patient is considered a/an Not Applicable   Race African-American/Black     Patient Assistance   Location of Patient Assistance Not Applicable   Patient's financial/insurance status Medicare   Uninsured Patient ([facility] [employer]s) No   Patient referred to apply for the following financial assistance Not Applicable   Food insecurities addressed Not Applicable   Transportation assistance No   Assistance securing medications No   Educational health offerings Exercise/physical activity     Encounter Details   Primary purpose of visit Education/Health Concerns   Was an Emergency Department visit averted? Not Applicable   Does patient have a medical provider? Yes   Patient referred to Not Applicable   Was a mental health screening completed? (GAINS tool) No   Does patient have dental issues? No   Does patient have vision issues? No   Does your patient have an abnormal blood pressure today? No   Since previous encounter, have you referred patient for abnormal blood pressure that resulted in a new diagnosis or medication change? No   Does your patient have an abnormal blood glucose today? No   Since previous encounter, have you referred patient for abnormal blood glucose that resulted in a new diagnosis or medication change? No   Was there a life-saving intervention made? No    Attended chair yoga class.

## 2017-04-04 DIAGNOSIS — Z79899 Other long term (current) drug therapy: Secondary | ICD-10-CM | POA: Diagnosis not present

## 2017-04-04 DIAGNOSIS — M25472 Effusion, left ankle: Secondary | ICD-10-CM | POA: Diagnosis not present

## 2017-04-04 DIAGNOSIS — R5383 Other fatigue: Secondary | ICD-10-CM | POA: Diagnosis not present

## 2017-04-04 DIAGNOSIS — Z6834 Body mass index (BMI) 34.0-34.9, adult: Secondary | ICD-10-CM | POA: Diagnosis not present

## 2017-04-04 DIAGNOSIS — N183 Chronic kidney disease, stage 3 (moderate): Secondary | ICD-10-CM | POA: Diagnosis not present

## 2017-04-04 DIAGNOSIS — M150 Primary generalized (osteo)arthritis: Secondary | ICD-10-CM | POA: Diagnosis not present

## 2017-04-04 DIAGNOSIS — E669 Obesity, unspecified: Secondary | ICD-10-CM | POA: Diagnosis not present

## 2017-04-04 DIAGNOSIS — Z23 Encounter for immunization: Secondary | ICD-10-CM | POA: Diagnosis not present

## 2017-04-04 DIAGNOSIS — Z7952 Long term (current) use of systemic steroids: Secondary | ICD-10-CM | POA: Diagnosis not present

## 2017-04-04 DIAGNOSIS — M3322 Polymyositis with myopathy: Secondary | ICD-10-CM | POA: Diagnosis not present

## 2017-04-10 ENCOUNTER — Ambulatory Visit (INDEPENDENT_AMBULATORY_CARE_PROVIDER_SITE_OTHER): Payer: Medicare Other | Admitting: Orthotics

## 2017-04-10 DIAGNOSIS — M76822 Posterior tibial tendinitis, left leg: Secondary | ICD-10-CM

## 2017-04-11 NOTE — Progress Notes (Signed)
Patient came in today to pick up standard Afo brace.  Patient was evaluated for fit and function.   The brace fit very well and there were any complaints of the way it felt once donned.  The brace offered ankle stability in both saggital and coroneal planes.  Patient advised to always wear proper fitting shoes with brace. 

## 2017-04-25 DIAGNOSIS — E119 Type 2 diabetes mellitus without complications: Secondary | ICD-10-CM | POA: Diagnosis not present

## 2017-04-25 DIAGNOSIS — G44209 Tension-type headache, unspecified, not intractable: Secondary | ICD-10-CM | POA: Diagnosis not present

## 2017-04-25 DIAGNOSIS — M3320 Polymyositis, organ involvement unspecified: Secondary | ICD-10-CM | POA: Diagnosis not present

## 2017-05-13 DIAGNOSIS — H01001 Unspecified blepharitis right upper eyelid: Secondary | ICD-10-CM | POA: Diagnosis not present

## 2017-05-13 DIAGNOSIS — H04123 Dry eye syndrome of bilateral lacrimal glands: Secondary | ICD-10-CM | POA: Diagnosis not present

## 2017-05-13 DIAGNOSIS — H01004 Unspecified blepharitis left upper eyelid: Secondary | ICD-10-CM | POA: Diagnosis not present

## 2017-05-14 DIAGNOSIS — L814 Other melanin hyperpigmentation: Secondary | ICD-10-CM | POA: Diagnosis not present

## 2017-05-14 DIAGNOSIS — L821 Other seborrheic keratosis: Secondary | ICD-10-CM | POA: Diagnosis not present

## 2017-05-14 DIAGNOSIS — D229 Melanocytic nevi, unspecified: Secondary | ICD-10-CM | POA: Diagnosis not present

## 2017-05-16 DIAGNOSIS — R6889 Other general symptoms and signs: Secondary | ICD-10-CM | POA: Diagnosis not present

## 2017-05-16 DIAGNOSIS — E119 Type 2 diabetes mellitus without complications: Secondary | ICD-10-CM | POA: Diagnosis not present

## 2017-06-03 NOTE — Congregational Nurse Program (Signed)
Congregational Nurse Program Note  Date of Encounter: 06/03/2017  Past Medical History: Past Medical History:  Diagnosis Date  . Arthritis   . GERD (gastroesophageal reflux disease)   . Headache(784.0)   . Hypertension   . Swelling of both ankles     Encounter Details: CNP Questionnaire - 06/03/17 1431      Questionnaire   Patient Status  Not Applicable    Race  Black or African American    Location Patient Served At  Not Applicable    Insurance  Medicare    Uninsured  Not Applicable    Food  No food insecurities    Housing/Utilities  Yes, have permanent housing    Transportation  No transportation needs    Interpersonal Safety  Yes, feel physically and emotionally safe where you currently live    Medication  No medication insecurities    Medical Provider  Yes    Referrals  Not Applicable    ED Visit Averted  Not Applicable    Life-Saving Intervention Made  Not Applicable     Madelaine Etienne, [place], [phone].

## 2017-06-18 DIAGNOSIS — M549 Dorsalgia, unspecified: Secondary | ICD-10-CM | POA: Diagnosis not present

## 2017-06-18 DIAGNOSIS — M5416 Radiculopathy, lumbar region: Secondary | ICD-10-CM | POA: Diagnosis not present

## 2017-06-18 DIAGNOSIS — M4807 Spinal stenosis, lumbosacral region: Secondary | ICD-10-CM | POA: Diagnosis not present

## 2017-07-09 DIAGNOSIS — N183 Chronic kidney disease, stage 3 (moderate): Secondary | ICD-10-CM | POA: Diagnosis not present

## 2017-07-09 DIAGNOSIS — Z79899 Other long term (current) drug therapy: Secondary | ICD-10-CM | POA: Diagnosis not present

## 2017-07-09 DIAGNOSIS — Z7952 Long term (current) use of systemic steroids: Secondary | ICD-10-CM | POA: Diagnosis not present

## 2017-07-09 DIAGNOSIS — E669 Obesity, unspecified: Secondary | ICD-10-CM | POA: Diagnosis not present

## 2017-07-09 DIAGNOSIS — M25472 Effusion, left ankle: Secondary | ICD-10-CM | POA: Diagnosis not present

## 2017-07-09 DIAGNOSIS — M3322 Polymyositis with myopathy: Secondary | ICD-10-CM | POA: Diagnosis not present

## 2017-07-09 DIAGNOSIS — M150 Primary generalized (osteo)arthritis: Secondary | ICD-10-CM | POA: Diagnosis not present

## 2017-07-09 DIAGNOSIS — Z6834 Body mass index (BMI) 34.0-34.9, adult: Secondary | ICD-10-CM | POA: Diagnosis not present

## 2017-07-09 DIAGNOSIS — H9193 Unspecified hearing loss, bilateral: Secondary | ICD-10-CM | POA: Diagnosis not present

## 2017-08-12 DIAGNOSIS — M3322 Polymyositis with myopathy: Secondary | ICD-10-CM | POA: Diagnosis not present

## 2017-08-12 DIAGNOSIS — R262 Difficulty in walking, not elsewhere classified: Secondary | ICD-10-CM | POA: Diagnosis not present

## 2017-08-12 DIAGNOSIS — M6281 Muscle weakness (generalized): Secondary | ICD-10-CM | POA: Diagnosis not present

## 2017-08-19 DIAGNOSIS — M3322 Polymyositis with myopathy: Secondary | ICD-10-CM | POA: Diagnosis not present

## 2017-08-19 DIAGNOSIS — M6281 Muscle weakness (generalized): Secondary | ICD-10-CM | POA: Diagnosis not present

## 2017-08-19 DIAGNOSIS — R262 Difficulty in walking, not elsewhere classified: Secondary | ICD-10-CM | POA: Diagnosis not present

## 2017-08-25 DIAGNOSIS — M6281 Muscle weakness (generalized): Secondary | ICD-10-CM | POA: Diagnosis not present

## 2017-08-25 DIAGNOSIS — M3322 Polymyositis with myopathy: Secondary | ICD-10-CM | POA: Diagnosis not present

## 2017-08-25 DIAGNOSIS — R262 Difficulty in walking, not elsewhere classified: Secondary | ICD-10-CM | POA: Diagnosis not present

## 2017-08-28 NOTE — Congregational Nurse Program (Signed)
Congregational Nurse Program Note  Date of Encounter: 08/28/2017  Past Medical History: Past Medical History:  Diagnosis Date  . Arthritis   . GERD (gastroesophageal reflux disease)   . Headache(784.0)   . Hypertension   . Swelling of both ankles     Encounter Details: CNP Questionnaire - 08/28/17 1646      Questionnaire   Patient Status  Not Applicable    Race  Black or African American    Location Patient Served At  Not Applicable    Insurance  Medicare    Uninsured  Not Applicable    Food  No food insecurities    Housing/Utilities  Yes, have permanent housing    Transportation  No transportation needs    Interpersonal Safety  Yes, feel physically and emotionally safe where you currently live    Medication  No medication insecurities    Medical Provider  Yes    Referrals  Not Applicable    ED Visit Averted  Not Applicable    Life-Saving Intervention Made  Not Applicable

## 2017-09-01 DIAGNOSIS — M6281 Muscle weakness (generalized): Secondary | ICD-10-CM | POA: Diagnosis not present

## 2017-09-01 DIAGNOSIS — M3322 Polymyositis with myopathy: Secondary | ICD-10-CM | POA: Diagnosis not present

## 2017-09-01 DIAGNOSIS — R262 Difficulty in walking, not elsewhere classified: Secondary | ICD-10-CM | POA: Diagnosis not present

## 2017-09-03 DIAGNOSIS — M6281 Muscle weakness (generalized): Secondary | ICD-10-CM | POA: Diagnosis not present

## 2017-09-03 DIAGNOSIS — R262 Difficulty in walking, not elsewhere classified: Secondary | ICD-10-CM | POA: Diagnosis not present

## 2017-09-03 DIAGNOSIS — M3322 Polymyositis with myopathy: Secondary | ICD-10-CM | POA: Diagnosis not present

## 2017-09-05 DIAGNOSIS — M47812 Spondylosis without myelopathy or radiculopathy, cervical region: Secondary | ICD-10-CM | POA: Diagnosis not present

## 2017-09-05 DIAGNOSIS — G8929 Other chronic pain: Secondary | ICD-10-CM | POA: Diagnosis not present

## 2017-09-05 DIAGNOSIS — E118 Type 2 diabetes mellitus with unspecified complications: Secondary | ICD-10-CM | POA: Diagnosis not present

## 2017-09-05 DIAGNOSIS — R51 Headache: Secondary | ICD-10-CM | POA: Diagnosis not present

## 2017-09-05 DIAGNOSIS — M25512 Pain in left shoulder: Secondary | ICD-10-CM | POA: Diagnosis not present

## 2017-09-05 DIAGNOSIS — N183 Chronic kidney disease, stage 3 (moderate): Secondary | ICD-10-CM | POA: Diagnosis not present

## 2017-09-05 DIAGNOSIS — M542 Cervicalgia: Secondary | ICD-10-CM | POA: Diagnosis not present

## 2017-09-05 DIAGNOSIS — M67912 Unspecified disorder of synovium and tendon, left shoulder: Secondary | ICD-10-CM | POA: Diagnosis not present

## 2017-09-05 DIAGNOSIS — I10 Essential (primary) hypertension: Secondary | ICD-10-CM | POA: Diagnosis not present

## 2017-09-05 DIAGNOSIS — R6889 Other general symptoms and signs: Secondary | ICD-10-CM | POA: Diagnosis not present

## 2017-09-05 DIAGNOSIS — M359 Systemic involvement of connective tissue, unspecified: Secondary | ICD-10-CM | POA: Diagnosis not present

## 2017-09-10 DIAGNOSIS — M3322 Polymyositis with myopathy: Secondary | ICD-10-CM | POA: Diagnosis not present

## 2017-09-10 DIAGNOSIS — R262 Difficulty in walking, not elsewhere classified: Secondary | ICD-10-CM | POA: Diagnosis not present

## 2017-09-10 DIAGNOSIS — M6281 Muscle weakness (generalized): Secondary | ICD-10-CM | POA: Diagnosis not present

## 2017-09-15 DIAGNOSIS — M6281 Muscle weakness (generalized): Secondary | ICD-10-CM | POA: Diagnosis not present

## 2017-09-15 DIAGNOSIS — R262 Difficulty in walking, not elsewhere classified: Secondary | ICD-10-CM | POA: Diagnosis not present

## 2017-09-15 DIAGNOSIS — M3322 Polymyositis with myopathy: Secondary | ICD-10-CM | POA: Diagnosis not present

## 2017-09-22 DIAGNOSIS — M6281 Muscle weakness (generalized): Secondary | ICD-10-CM | POA: Diagnosis not present

## 2017-09-22 DIAGNOSIS — M3322 Polymyositis with myopathy: Secondary | ICD-10-CM | POA: Diagnosis not present

## 2017-09-22 DIAGNOSIS — R262 Difficulty in walking, not elsewhere classified: Secondary | ICD-10-CM | POA: Diagnosis not present

## 2017-09-24 DIAGNOSIS — M3322 Polymyositis with myopathy: Secondary | ICD-10-CM | POA: Diagnosis not present

## 2017-09-24 DIAGNOSIS — M6281 Muscle weakness (generalized): Secondary | ICD-10-CM | POA: Diagnosis not present

## 2017-09-24 DIAGNOSIS — R262 Difficulty in walking, not elsewhere classified: Secondary | ICD-10-CM | POA: Diagnosis not present

## 2017-10-01 DIAGNOSIS — M3322 Polymyositis with myopathy: Secondary | ICD-10-CM | POA: Diagnosis not present

## 2017-10-01 DIAGNOSIS — M6281 Muscle weakness (generalized): Secondary | ICD-10-CM | POA: Diagnosis not present

## 2017-10-01 DIAGNOSIS — R262 Difficulty in walking, not elsewhere classified: Secondary | ICD-10-CM | POA: Diagnosis not present

## 2017-10-06 DIAGNOSIS — R262 Difficulty in walking, not elsewhere classified: Secondary | ICD-10-CM | POA: Diagnosis not present

## 2017-10-06 DIAGNOSIS — M6281 Muscle weakness (generalized): Secondary | ICD-10-CM | POA: Diagnosis not present

## 2017-10-06 DIAGNOSIS — M3322 Polymyositis with myopathy: Secondary | ICD-10-CM | POA: Diagnosis not present

## 2017-10-08 DIAGNOSIS — H9193 Unspecified hearing loss, bilateral: Secondary | ICD-10-CM | POA: Diagnosis not present

## 2017-10-08 DIAGNOSIS — Z79899 Other long term (current) drug therapy: Secondary | ICD-10-CM | POA: Diagnosis not present

## 2017-10-08 DIAGNOSIS — Z7952 Long term (current) use of systemic steroids: Secondary | ICD-10-CM | POA: Diagnosis not present

## 2017-10-08 DIAGNOSIS — E669 Obesity, unspecified: Secondary | ICD-10-CM | POA: Diagnosis not present

## 2017-10-08 DIAGNOSIS — M25472 Effusion, left ankle: Secondary | ICD-10-CM | POA: Diagnosis not present

## 2017-10-08 DIAGNOSIS — M3322 Polymyositis with myopathy: Secondary | ICD-10-CM | POA: Diagnosis not present

## 2017-10-08 DIAGNOSIS — N183 Chronic kidney disease, stage 3 (moderate): Secondary | ICD-10-CM | POA: Diagnosis not present

## 2017-10-08 DIAGNOSIS — M150 Primary generalized (osteo)arthritis: Secondary | ICD-10-CM | POA: Diagnosis not present

## 2017-10-08 DIAGNOSIS — Z6834 Body mass index (BMI) 34.0-34.9, adult: Secondary | ICD-10-CM | POA: Diagnosis not present

## 2017-10-10 DIAGNOSIS — R51 Headache: Secondary | ICD-10-CM | POA: Diagnosis not present

## 2017-10-13 DIAGNOSIS — M3322 Polymyositis with myopathy: Secondary | ICD-10-CM | POA: Diagnosis not present

## 2017-10-13 DIAGNOSIS — R262 Difficulty in walking, not elsewhere classified: Secondary | ICD-10-CM | POA: Diagnosis not present

## 2017-10-13 DIAGNOSIS — M6281 Muscle weakness (generalized): Secondary | ICD-10-CM | POA: Diagnosis not present

## 2017-12-08 DIAGNOSIS — Z1231 Encounter for screening mammogram for malignant neoplasm of breast: Secondary | ICD-10-CM | POA: Diagnosis not present

## 2017-12-13 ENCOUNTER — Ambulatory Visit (HOSPITAL_COMMUNITY)
Admission: EM | Admit: 2017-12-13 | Discharge: 2017-12-13 | Disposition: A | Discharge status: Home / Self Care | Payer: Medicare Other | Attending: Physician Assistant | Admitting: Physician Assistant

## 2017-12-13 ENCOUNTER — Other Ambulatory Visit: Payer: Self-pay

## 2017-12-13 ENCOUNTER — Ambulatory Visit (INDEPENDENT_AMBULATORY_CARE_PROVIDER_SITE_OTHER): Payer: Medicare Other

## 2017-12-13 ENCOUNTER — Encounter (HOSPITAL_COMMUNITY): Payer: Self-pay | Admitting: Emergency Medicine

## 2017-12-13 DIAGNOSIS — M79606 Pain in leg, unspecified: Secondary | ICD-10-CM

## 2017-12-13 DIAGNOSIS — S40812A Abrasion of left upper arm, initial encounter: Secondary | ICD-10-CM

## 2017-12-13 DIAGNOSIS — M1612 Unilateral primary osteoarthritis, left hip: Secondary | ICD-10-CM | POA: Diagnosis not present

## 2017-12-13 MED ORDER — TRAMADOL HCL 50 MG PO TABS: 25.0000 mg | ORAL_TABLET | Freq: Four times a day (QID) | ORAL | 0 refills | Status: DC | PRN

## 2017-12-13 NOTE — Discharge Instructions (Addendum)
Your leg is not broken.  Given that you have a good caregiver at home with you is reasonable to try some tramadol 25 mg, or a half tab, as needed every 6-8 hours for pain.  Come back if you have any significant problems.
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
## 2017-12-17 DIAGNOSIS — E118 Type 2 diabetes mellitus with unspecified complications: Secondary | ICD-10-CM | POA: Diagnosis not present

## 2017-12-17 DIAGNOSIS — R42 Dizziness and giddiness: Secondary | ICD-10-CM | POA: Diagnosis not present

## 2017-12-17 DIAGNOSIS — M79602 Pain in left arm: Secondary | ICD-10-CM | POA: Diagnosis not present

## 2017-12-17 DIAGNOSIS — D649 Anemia, unspecified: Secondary | ICD-10-CM | POA: Diagnosis not present

## 2017-12-31 DIAGNOSIS — Z862 Personal history of diseases of the blood and blood-forming organs and certain disorders involving the immune mechanism: Secondary | ICD-10-CM | POA: Diagnosis not present

## 2017-12-31 DIAGNOSIS — R809 Proteinuria, unspecified: Secondary | ICD-10-CM | POA: Diagnosis not present

## 2017-12-31 DIAGNOSIS — D472 Monoclonal gammopathy: Secondary | ICD-10-CM | POA: Diagnosis not present

## 2018-01-09 DIAGNOSIS — Z7952 Long term (current) use of systemic steroids: Secondary | ICD-10-CM | POA: Diagnosis not present

## 2018-01-09 DIAGNOSIS — E669 Obesity, unspecified: Secondary | ICD-10-CM | POA: Diagnosis not present

## 2018-01-09 DIAGNOSIS — M3322 Polymyositis with myopathy: Secondary | ICD-10-CM | POA: Diagnosis not present

## 2018-01-09 DIAGNOSIS — M25472 Effusion, left ankle: Secondary | ICD-10-CM | POA: Diagnosis not present

## 2018-01-09 DIAGNOSIS — Z6833 Body mass index (BMI) 33.0-33.9, adult: Secondary | ICD-10-CM | POA: Diagnosis not present

## 2018-01-09 DIAGNOSIS — Z79899 Other long term (current) drug therapy: Secondary | ICD-10-CM | POA: Diagnosis not present

## 2018-01-09 DIAGNOSIS — N183 Chronic kidney disease, stage 3 (moderate): Secondary | ICD-10-CM | POA: Diagnosis not present

## 2018-01-09 DIAGNOSIS — H9193 Unspecified hearing loss, bilateral: Secondary | ICD-10-CM | POA: Diagnosis not present

## 2018-01-09 DIAGNOSIS — M150 Primary generalized (osteo)arthritis: Secondary | ICD-10-CM | POA: Diagnosis not present

## 2018-01-13 ENCOUNTER — Emergency Department (HOSPITAL_COMMUNITY): Payer: Medicare Other

## 2018-01-13 ENCOUNTER — Encounter (HOSPITAL_COMMUNITY): Payer: Self-pay | Admitting: Emergency Medicine

## 2018-01-13 ENCOUNTER — Emergency Department (HOSPITAL_COMMUNITY)
Admission: EM | Admit: 2018-01-13 | Discharge: 2018-01-13 | Disposition: A | Discharge status: Home / Self Care | Payer: Medicare Other | Attending: Emergency Medicine | Admitting: Emergency Medicine

## 2018-01-13 ENCOUNTER — Emergency Department (HOSPITAL_COMMUNITY)
Admission: EM | Admit: 2018-01-13 | Discharge: 2018-01-13 | Discharge status: Absent without leave | Payer: Medicare Other | Source: Home / Self Care

## 2018-01-13 DIAGNOSIS — Z79899 Other long term (current) drug therapy: Secondary | ICD-10-CM | POA: Insufficient documentation

## 2018-01-13 DIAGNOSIS — R51 Headache: Secondary | ICD-10-CM | POA: Diagnosis not present

## 2018-01-13 DIAGNOSIS — I10 Essential (primary) hypertension: Secondary | ICD-10-CM | POA: Diagnosis not present

## 2018-01-13 DIAGNOSIS — G43009 Migraine without aura, not intractable, without status migrainosus: Secondary | ICD-10-CM | POA: Insufficient documentation

## 2018-01-13 MED ORDER — ACETAMINOPHEN 500 MG PO TABS
1000.0000 mg | ORAL_TABLET | Freq: Once | ORAL | Status: AC
  Administered 2018-01-13: 1000 mg via ORAL
  Filled 2018-01-13: qty 2

## 2018-01-13 MED ORDER — METOCLOPRAMIDE HCL 5 MG/ML IJ SOLN
10.0000 mg | Freq: Once | INTRAMUSCULAR | Status: AC
  Administered 2018-01-13: 10 mg via INTRAMUSCULAR
  Filled 2018-01-13: qty 2

## 2018-01-13 MED ORDER — DIPHENHYDRAMINE HCL 50 MG/ML IJ SOLN
12.5000 mg | Freq: Once | INTRAMUSCULAR | Status: AC
  Administered 2018-01-13: 12.5 mg via INTRAMUSCULAR
  Filled 2018-01-13: qty 1

## 2018-01-13 MED ORDER — KETOROLAC TROMETHAMINE 15 MG/ML IJ SOLN
30.0000 mg | Freq: Once | INTRAMUSCULAR | Status: AC
Start: 2018-01-13 — End: 2018-01-13
  Administered 2018-01-13: 30 mg via INTRAMUSCULAR
  Filled 2018-01-13: qty 2

## 2018-01-13 NOTE — ED Provider Notes (Signed)
[facility] DEPT Provider Note   CSN: [id number] Arrival date & time: 01/13/18  1545     History   Chief Complaint Chief Complaint  Patient presents with  . Headache    HPI Heather Olson is a 84 y.o. female.  84 year old female with prior history of hypertension, GERD, and headaches presents with complaint of headache.  Patient reports that her headache started last night.  He has been intermittent over the course of today.  She reports some improvement with a dose of Excedrin earlier this morning.  Her son brought her in for further evaluation.  She denies that this is the worst headache of her life.  She denies associated fever, nausea, vomiting, visual change, focal weakness, or other complaint.  At the time of my evaluation she does not appear to be uncomfortable.  Of note, she reports that her blood pressure typically runs into the 397'Q systolic.  The history is provided by the patient.  Headache   This is a new problem. The current episode started 6 to 12 hours ago. The problem occurs every few hours. The problem has not changed since onset.The headache is associated with nothing. The pain is located in the occipital region. The pain is mild. The pain does not radiate. Pertinent negatives include no fever, no nausea and no vomiting. She has tried NSAIDs and acetaminophen for the symptoms.    Past Medical History:  Diagnosis Date  . Arthritis   . GERD (gastroesophageal reflux disease)   . Headache(784.0)   . Hypertension   . Swelling of both ankles     Patient Active Problem List   Diagnosis Date Noted  . Postop check 06/15/2013  . Cholelithiases 05/11/2013    Past Surgical History:  Procedure Laterality Date  . ABDOMINAL HYSTERECTOMY    . APPENDECTOMY    . CHOLECYSTECTOMY N/A 05/28/2013   Procedure: LAPAROSCOPIC CHOLECYSTECTOMY ;  Surgeon: Gwenyth Ober, MD;  Location: [facility];  Service: General;  Laterality: N/A;  . TONSILLECTOMY        OB History   None      Home Medications    Prior to Admission medications   Medication Sig Start Date End Date Taking? Authorizing Provider  aspirin-acetaminophen-caffeine (EXCEDRIN MIGRAINE) [phone] MG tablet Take 2 tablets by mouth every 6 (six) hours as needed for headache.   Yes [provider]  azaTHIOprine (IMURAN) 50 MG tablet Take 100 mg by mouth 2 (two) times daily.    Yes [provider]  hydrochlorothiazide (HYDRODIURIL) 25 MG tablet Take 25 mg by mouth daily.   Yes [provider]  losartan (COZAAR) 50 MG tablet Take 50 mg by mouth daily.   Yes [provider]  omeprazole (PRILOSEC) 40 MG capsule Take 40 mg by mouth 2 (two) times daily.    Yes [provider]  predniSONE (DELTASONE) 5 MG tablet Take 5 mg by mouth daily.   Yes [provider]  traMADol (ULTRAM) 50 MG tablet Take 0.5 tablets (25 mg total) by mouth every 6 (six) hours as needed for severe pain. Patient not taking: Reported on 01/13/2018 12/13/17   Tereasa Coop, PA-C    Family History Family History  Problem Relation Age of Onset  . Diabetes Mother     Social History Social History   Tobacco Use  . Smoking status: Never Smoker  . Smokeless tobacco: Never Used  Substance Use Topics  . Alcohol use: No  . Drug use: No  Allergies   Lisinopril   Review of Systems Review of Systems  Constitutional: Negative for fever.  Gastrointestinal: Negative for nausea and vomiting.  Neurological: Positive for headaches.  All other systems reviewed and are negative.    Physical Exam Updated Vital Signs BP (!) 210/77 (BP Location: Right Arm)   Pulse 69   Temp 98.2 F (36.8 C) (Oral)   Resp 18   SpO2 99%   Physical Exam  Constitutional: She is oriented to person, place, and time. She appears well-developed and well-nourished. No distress.  HENT:  Head: Normocephalic and atraumatic.  Mouth/Throat: Oropharynx is clear and moist.    Eyes: Pupils are equal, round, and reactive to light. Conjunctivae and EOM are normal.  Neck: Normal range of motion. Neck supple.  Cardiovascular: Normal rate, regular rhythm and normal heart sounds.  Pulmonary/Chest: Effort normal and breath sounds normal. No respiratory distress.  Abdominal: Soft. She exhibits no distension. There is no tenderness.  Musculoskeletal: Normal range of motion. She exhibits no edema or deformity.  Neurological: She is alert and oriented to person, place, and time. She has normal strength. She is not disoriented. No cranial nerve deficit or sensory deficit. Coordination and gait normal. GCS eye subscore is 4. GCS verbal subscore is 5. GCS motor subscore is 6.  Alert and oriented x4 Normal speech  No facial droop 5 out of 5 strength in all 4 extremities VAN negative   Skin: Skin is warm and dry.  Psychiatric: She has a normal mood and affect.  Nursing note and vitals reviewed.    ED Treatments / Results  Labs (all labs ordered are listed, but only abnormal results are displayed) Labs Reviewed - No data to display  EKG None  Radiology Ct Head Wo Contrast  Result Date: 01/13/2018 CLINICAL DATA:  84 year old female with headaches since last night. Initial encounter. EXAM: CT HEAD WITHOUT CONTRAST TECHNIQUE: Contiguous axial images were obtained from the base of the skull through the vertex without intravenous contrast. COMPARISON:  None. FINDINGS: Brain: No intracranial hemorrhage or CT evidence of large acute infarct. Mild chronic microvascular changes. No intracranial mass lesion noted on this unenhanced exam. Vascular: Vascular calcifications Skull: Negative Sinuses/Orbits: No acute orbital abnormality. Visualized paranasal sinuses are clear. Other: Mastoid air cells and middle ear cavities are clear. IMPRESSION: No acute intracranial abnormality. Chronic microvascular changes. Electronically Signed   By: Genia Del M.D.   On: 01/13/2018 18:43     Procedures Procedures (including critical care time)  Medications Ordered in ED Medications  acetaminophen (TYLENOL) tablet 1,000 mg (has no administration in time range)  metoCLOPramide (REGLAN) injection 10 mg (has no administration in time range)  ketorolac (TORADOL) 15 MG/ML injection 30 mg (30 mg Intramuscular Given 01/13/18 1744)  diphenhydrAMINE (BENADRYL) injection 12.5 mg (12.5 mg Intramuscular Given 01/13/18 1745)     Initial Impression / Assessment and Plan / ED Course  I have reviewed the triage vital signs and the nursing notes.  Pertinent labs & imaging results that were available during my care of the patient were reviewed by me and considered in my medical decision making (see chart for details).     MDM  Screen complete  Patient is presenting for evaluation of reported headache.  Initial exam is not suggestive of more concerning pathology.  CT head does not reveal acute pathology.  Patient feels significantly improved following administration of medications in the ED.  Of note, patient's normal blood pressure is usually into the 509'T systolic at baseline.  After medication administration for treatment of her headache discomfort, the blood pressure of the patient has improved back to her baseline - 174/66.   Patient now desires discharge home.  Strict return precautions are given and understood.  Importance of close follow-up is stressed.  Final Clinical Impressions(s) / ED Diagnoses   Final diagnoses:  Migraine without aura and without status migrainosus, not intractable    ED Discharge Orders    None       Valarie Merino, MD 01/13/18 [place]

## 2018-01-13 NOTE — ED Notes (Signed)
Called pt for triage, no answer 

## 2018-01-13 NOTE — ED Notes (Addendum)
Writer call for triage X3, no response

## 2018-01-13 NOTE — ED Triage Notes (Signed)
Patient c/o headache since last night. Denies N/V, blurred vision, weakness, chest pain. Ambulatory.

## 2018-01-13 NOTE — ED Notes (Signed)
Called pt for triage no answer 

## 2018-01-13 NOTE — Discharge Instructions (Signed)
Please return for any problem.  Follow-up with your regular doctor tomorrow morning as instructed.

## 2018-01-14 DIAGNOSIS — R51 Headache: Secondary | ICD-10-CM | POA: Diagnosis not present

## 2018-01-14 DIAGNOSIS — K219 Gastro-esophageal reflux disease without esophagitis: Secondary | ICD-10-CM | POA: Diagnosis not present

## 2018-01-14 DIAGNOSIS — I10 Essential (primary) hypertension: Secondary | ICD-10-CM | POA: Diagnosis not present

## 2018-01-14 DIAGNOSIS — E1122 Type 2 diabetes mellitus with diabetic chronic kidney disease: Secondary | ICD-10-CM | POA: Diagnosis not present

## 2018-01-14 DIAGNOSIS — M3320 Polymyositis, organ involvement unspecified: Secondary | ICD-10-CM | POA: Diagnosis not present

## 2018-01-14 DIAGNOSIS — N182 Chronic kidney disease, stage 2 (mild): Secondary | ICD-10-CM | POA: Diagnosis not present

## 2018-01-14 DIAGNOSIS — I129 Hypertensive chronic kidney disease with stage 1 through stage 4 chronic kidney disease, or unspecified chronic kidney disease: Secondary | ICD-10-CM | POA: Diagnosis not present

## 2018-01-15 DIAGNOSIS — I129 Hypertensive chronic kidney disease with stage 1 through stage 4 chronic kidney disease, or unspecified chronic kidney disease: Secondary | ICD-10-CM | POA: Diagnosis present

## 2018-01-15 DIAGNOSIS — I10 Essential (primary) hypertension: Secondary | ICD-10-CM | POA: Diagnosis not present

## 2018-01-15 DIAGNOSIS — M3320 Polymyositis, organ involvement unspecified: Secondary | ICD-10-CM | POA: Diagnosis present

## 2018-01-15 DIAGNOSIS — M5136 Other intervertebral disc degeneration, lumbar region: Secondary | ICD-10-CM | POA: Diagnosis present

## 2018-01-15 DIAGNOSIS — R51 Headache: Secondary | ICD-10-CM | POA: Diagnosis not present

## 2018-01-15 DIAGNOSIS — K219 Gastro-esophageal reflux disease without esophagitis: Secondary | ICD-10-CM | POA: Diagnosis present

## 2018-01-15 DIAGNOSIS — I959 Hypotension, unspecified: Secondary | ICD-10-CM | POA: Diagnosis not present

## 2018-01-15 DIAGNOSIS — E1122 Type 2 diabetes mellitus with diabetic chronic kidney disease: Secondary | ICD-10-CM | POA: Diagnosis present

## 2018-01-15 DIAGNOSIS — M48061 Spinal stenosis, lumbar region without neurogenic claudication: Secondary | ICD-10-CM | POA: Diagnosis present

## 2018-01-15 DIAGNOSIS — N182 Chronic kidney disease, stage 2 (mild): Secondary | ICD-10-CM | POA: Diagnosis present

## 2018-01-21 DIAGNOSIS — M5116 Intervertebral disc disorders with radiculopathy, lumbar region: Secondary | ICD-10-CM | POA: Diagnosis not present

## 2018-02-12 DIAGNOSIS — R6889 Other general symptoms and signs: Secondary | ICD-10-CM | POA: Diagnosis not present

## 2018-02-12 DIAGNOSIS — F458 Other somatoform disorders: Secondary | ICD-10-CM | POA: Diagnosis not present

## 2018-02-20 DIAGNOSIS — H3500 Unspecified background retinopathy: Secondary | ICD-10-CM | POA: Diagnosis not present

## 2018-02-20 DIAGNOSIS — H26491 Other secondary cataract, right eye: Secondary | ICD-10-CM | POA: Diagnosis not present

## 2018-02-20 DIAGNOSIS — H5201 Hypermetropia, right eye: Secondary | ICD-10-CM | POA: Diagnosis not present

## 2018-03-13 DIAGNOSIS — M5481 Occipital neuralgia: Secondary | ICD-10-CM | POA: Diagnosis not present

## 2018-03-13 DIAGNOSIS — Z5181 Encounter for therapeutic drug level monitoring: Secondary | ICD-10-CM | POA: Diagnosis not present

## 2018-03-20 DIAGNOSIS — Z5181 Encounter for therapeutic drug level monitoring: Secondary | ICD-10-CM | POA: Diagnosis not present

## 2018-03-20 DIAGNOSIS — E119 Type 2 diabetes mellitus without complications: Secondary | ICD-10-CM | POA: Diagnosis not present

## 2018-04-10 DIAGNOSIS — R29898 Other symptoms and signs involving the musculoskeletal system: Secondary | ICD-10-CM | POA: Diagnosis not present

## 2018-04-10 DIAGNOSIS — H9193 Unspecified hearing loss, bilateral: Secondary | ICD-10-CM | POA: Diagnosis not present

## 2018-04-10 DIAGNOSIS — Z79899 Other long term (current) drug therapy: Secondary | ICD-10-CM | POA: Diagnosis not present

## 2018-04-10 DIAGNOSIS — E669 Obesity, unspecified: Secondary | ICD-10-CM | POA: Diagnosis not present

## 2018-04-10 DIAGNOSIS — M25472 Effusion, left ankle: Secondary | ICD-10-CM | POA: Diagnosis not present

## 2018-04-10 DIAGNOSIS — M150 Primary generalized (osteo)arthritis: Secondary | ICD-10-CM | POA: Diagnosis not present

## 2018-04-10 DIAGNOSIS — Z6833 Body mass index (BMI) 33.0-33.9, adult: Secondary | ICD-10-CM | POA: Diagnosis not present

## 2018-04-10 DIAGNOSIS — Z7952 Long term (current) use of systemic steroids: Secondary | ICD-10-CM | POA: Diagnosis not present

## 2018-04-10 DIAGNOSIS — M3322 Polymyositis with myopathy: Secondary | ICD-10-CM | POA: Diagnosis not present

## 2018-04-10 DIAGNOSIS — N183 Chronic kidney disease, stage 3 (moderate): Secondary | ICD-10-CM | POA: Diagnosis not present

## 2018-04-29 DIAGNOSIS — M19071 Primary osteoarthritis, right ankle and foot: Secondary | ICD-10-CM | POA: Diagnosis not present

## 2018-04-29 DIAGNOSIS — R262 Difficulty in walking, not elsewhere classified: Secondary | ICD-10-CM | POA: Diagnosis not present

## 2018-04-29 DIAGNOSIS — M79672 Pain in left foot: Secondary | ICD-10-CM | POA: Diagnosis not present

## 2018-04-29 DIAGNOSIS — M76822 Posterior tibial tendinitis, left leg: Secondary | ICD-10-CM | POA: Diagnosis not present

## 2018-04-29 DIAGNOSIS — M2142 Flat foot [pes planus] (acquired), left foot: Secondary | ICD-10-CM | POA: Diagnosis not present

## 2018-04-29 DIAGNOSIS — M2141 Flat foot [pes planus] (acquired), right foot: Secondary | ICD-10-CM | POA: Diagnosis not present

## 2018-04-29 DIAGNOSIS — Z23 Encounter for immunization: Secondary | ICD-10-CM | POA: Diagnosis not present

## 2018-04-29 DIAGNOSIS — M79671 Pain in right foot: Secondary | ICD-10-CM | POA: Diagnosis not present

## 2018-05-07 DIAGNOSIS — Z01419 Encounter for gynecological examination (general) (routine) without abnormal findings: Secondary | ICD-10-CM | POA: Diagnosis not present

## 2018-05-07 DIAGNOSIS — Z124 Encounter for screening for malignant neoplasm of cervix: Secondary | ICD-10-CM | POA: Diagnosis not present

## 2018-05-21 ENCOUNTER — Ambulatory Visit (INDEPENDENT_AMBULATORY_CARE_PROVIDER_SITE_OTHER): Payer: Medicare Other | Admitting: Diagnostic Neuroimaging

## 2018-05-21 ENCOUNTER — Encounter (INDEPENDENT_AMBULATORY_CARE_PROVIDER_SITE_OTHER): Payer: Medicare Other | Admitting: Diagnostic Neuroimaging

## 2018-05-21 DIAGNOSIS — R29898 Other symptoms and signs involving the musculoskeletal system: Secondary | ICD-10-CM

## 2018-05-21 DIAGNOSIS — M79605 Pain in left leg: Secondary | ICD-10-CM | POA: Diagnosis not present

## 2018-05-21 DIAGNOSIS — Z0289 Encounter for other administrative examinations: Secondary | ICD-10-CM

## 2018-05-25 NOTE — Procedures (Signed)
[facility]  NCS (NERVE CONDUCTION STUDY) WITH EMG (ELECTROMYOGRAPHY) REPORT   STUDY DATE: 05/21/18 PATIENT NAME: Heather Olson DOB: 09/22/1932 MRN: [id number]  ORDERING CLINICIAN: Marella Chimes, PA-c  TECHNOLOGIST: Oneita Jolly ELECTROMYOGRAPHER: Earlean Polka. Enaya Howze, MD  CLINICAL INFORMATION: 84 year old female with left leg weakness and upper extremity numbness.  History of polymyositis.  FINDINGS: NERVE CONDUCTION STUDY:  Bilateral median motor responses have prolonged distal latencies, decreased amplitude on the left, normal amplitude on the right, and normal conduction velocities.  Left ulnar, left peroneal and left tibial motor responses are normal.  Bilateral median sensory responses have prolonged peak latencies, decreased amplitude on left, normal amplitude on the right.  Left ulnar, left sural and left superficial peroneal sensory responses are normal.  Left shoulder and left tibial F wave latencies are normal.    NEEDLE ELECTROMYOGRAPHY:  Needle examination of left lower extremity shows abnormal spontaneous activity in left vastus medialis and left tibialis anterior muscles at rest.    Early and polyphasic motor unit recruitment noted in left vastus medialis, tibials anterior, gastrocnemius, iliopsoas and gluteus medius muscles.  Left lumbar paraspinal muscles are normal.   IMPRESSION:   Abnormal study demonstrating: - Myopathic recruitment pattern affecting the left lower extremity, consistent with patient's diagnosis of polymyositis. - No definite electrodiagnostic evidence of underlying large fiber neuropathy or lumbar radiculopathy. - Patient complained of upper extremity numbness and testing confirms bilateral median neuropathies at the wrist consistent with mild bilateral carpal tunnel syndromes.      INTERPRETING PHYSICIAN:  Penni Bombard, MD Certified in Neurology, Neurophysiology and Neuroimaging  [facility] [address] [place], [place] [address] [phone]    [facility]    Nerve / Sites Muscle Latency Ref. Amplitude Ref. Rel Amp Segments Distance Velocity Ref. Area    ms ms mV mV %  cm m/s m/s mVms  L Median - APB     Wrist APB 4.9 ?4.4 2.7 ?4.0 100 Wrist - APB 7   10.0     Upper arm APB 9.2  2.7  100 Upper arm - Wrist 22 52 ?49 11.1  R Median - APB     Wrist APB 4.5 ?4.4 6.6 ?4.0 100 Wrist - APB 7   21.9     Upper arm APB 8.8  6.0  91.7 Upper arm - Wrist 22 52 ?49 21.1  L Ulnar - ADM     Wrist ADM 3.0 ?3.3 12.0 ?6.0 100 Wrist - ADM 7   32.9     B.Elbow ADM 6.7  11.0  91.8 B.Elbow - Wrist 18 49 ?49 32.6     A.Elbow ADM 9.1  10.7  97.2 A.Elbow - B.Elbow 12 50 ?49 33.5         A.Elbow - Wrist      L Peroneal - EDB     Ankle EDB 6.1 ?6.5 4.2 ?2.0 100 Ankle - EDB 9   10.9     Fib head EDB 13.5  3.2  78.1 Fib head - Ankle 32 44 ?44 9.0     Pop fossa EDB 15.2  3.5  107 Pop fossa - Fib head 8 48 ?44 9.9         Pop fossa - Ankle      L Tibial - AH     Ankle AH 5.8 ?5.8 4.5 ?4.0 100 Ankle - AH 9   15.1     Pop fossa AH 15.4  3.8  85.8  Pop fossa - Ankle 41 43 ?41 14.2               SNC    Nerve / Sites Rec. Site Peak Lat Ref.  Amp Ref. Segments Distance    ms ms V V  cm  L Sural - Ankle (Calf)     Calf Ankle 4.2 ?4.4 9 ?6 Calf - Ankle 14  L Superficial peroneal - Ankle     Lat leg Ankle 4.2 ?4.4 7 ?6 Lat leg - Ankle 14  L Median - Orthodromic (Dig II, Mid palm)     Dig II Wrist 4.1 ?3.4 6 ?10 Dig II - Wrist 13  R Median - Orthodromic (Dig II, Mid palm)     Dig II Wrist 3.9 ?3.4 14 ?10 Dig II - Wrist 13  L Ulnar - Orthodromic, (Dig V, Mid palm)     Dig V Wrist 3.1 ?3.1 8 ?5 Dig V - Wrist 70               F  Wave    Nerve F Lat Ref.   ms ms  L Ulnar - ADM 28.5 ?32.0  L Tibial - AH 55.8 ?56.0         EMG full       EMG Summary Table    Spontaneous MUAP Recruitment  Muscle IA Fib PSW Fasc Other Amp Dur. Poly Pattern  L. Vastus medialis Normal 1+ 1+ None  _______ Decreased Normal 2+ Early  L. Tibialis anterior Normal None 1+ None _______ Normal Normal Normal Normal  L. Gastrocnemius (Medial head) Normal None None None _______ Decreased Increased 1+ Early  L. Iliopsoas Normal None None None _______ Normal Increased 1+ Early  L. Gluteus medius Normal None None None _______ Normal Normal 1+ Early  L. Lumbar paraspinals Normal None None None _______ Normal Normal Normal Normal

## 2018-06-04 DIAGNOSIS — H903 Sensorineural hearing loss, bilateral: Secondary | ICD-10-CM | POA: Diagnosis not present

## 2018-06-12 DIAGNOSIS — H903 Sensorineural hearing loss, bilateral: Secondary | ICD-10-CM | POA: Diagnosis not present

## 2018-06-12 DIAGNOSIS — Z974 Presence of external hearing-aid: Secondary | ICD-10-CM | POA: Diagnosis not present

## 2018-07-03 DIAGNOSIS — Z5181 Encounter for therapeutic drug level monitoring: Secondary | ICD-10-CM | POA: Diagnosis not present

## 2018-07-03 DIAGNOSIS — R7303 Prediabetes: Secondary | ICD-10-CM | POA: Diagnosis not present

## 2018-07-17 DIAGNOSIS — M3322 Polymyositis with myopathy: Secondary | ICD-10-CM | POA: Diagnosis not present

## 2018-07-17 DIAGNOSIS — M150 Primary generalized (osteo)arthritis: Secondary | ICD-10-CM | POA: Diagnosis not present

## 2018-07-17 DIAGNOSIS — M25472 Effusion, left ankle: Secondary | ICD-10-CM | POA: Diagnosis not present

## 2018-07-17 DIAGNOSIS — N183 Chronic kidney disease, stage 3 (moderate): Secondary | ICD-10-CM | POA: Diagnosis not present

## 2018-07-17 DIAGNOSIS — Z7952 Long term (current) use of systemic steroids: Secondary | ICD-10-CM | POA: Diagnosis not present

## 2018-07-17 DIAGNOSIS — Z6833 Body mass index (BMI) 33.0-33.9, adult: Secondary | ICD-10-CM | POA: Diagnosis not present

## 2018-07-17 DIAGNOSIS — G5603 Carpal tunnel syndrome, bilateral upper limbs: Secondary | ICD-10-CM | POA: Diagnosis not present

## 2018-07-17 DIAGNOSIS — E669 Obesity, unspecified: Secondary | ICD-10-CM | POA: Diagnosis not present

## 2018-07-17 DIAGNOSIS — Z79899 Other long term (current) drug therapy: Secondary | ICD-10-CM | POA: Diagnosis not present

## 2018-07-17 DIAGNOSIS — H9193 Unspecified hearing loss, bilateral: Secondary | ICD-10-CM | POA: Diagnosis not present

## 2018-07-17 DIAGNOSIS — R29898 Other symptoms and signs involving the musculoskeletal system: Secondary | ICD-10-CM | POA: Diagnosis not present

## 2018-08-05 DIAGNOSIS — M5116 Intervertebral disc disorders with radiculopathy, lumbar region: Secondary | ICD-10-CM | POA: Diagnosis not present

## 2018-08-10 DIAGNOSIS — H903 Sensorineural hearing loss, bilateral: Secondary | ICD-10-CM | POA: Diagnosis not present

## 2018-08-31 DIAGNOSIS — Z7952 Long term (current) use of systemic steroids: Secondary | ICD-10-CM | POA: Diagnosis not present

## 2018-08-31 DIAGNOSIS — M3322 Polymyositis with myopathy: Secondary | ICD-10-CM | POA: Diagnosis not present

## 2018-08-31 DIAGNOSIS — H9193 Unspecified hearing loss, bilateral: Secondary | ICD-10-CM | POA: Diagnosis not present

## 2018-08-31 DIAGNOSIS — R29898 Other symptoms and signs involving the musculoskeletal system: Secondary | ICD-10-CM | POA: Diagnosis not present

## 2018-08-31 DIAGNOSIS — M150 Primary generalized (osteo)arthritis: Secondary | ICD-10-CM | POA: Diagnosis not present

## 2018-08-31 DIAGNOSIS — Z79899 Other long term (current) drug therapy: Secondary | ICD-10-CM | POA: Diagnosis not present

## 2018-08-31 DIAGNOSIS — E669 Obesity, unspecified: Secondary | ICD-10-CM | POA: Diagnosis not present

## 2018-08-31 DIAGNOSIS — G5603 Carpal tunnel syndrome, bilateral upper limbs: Secondary | ICD-10-CM | POA: Diagnosis not present

## 2018-08-31 DIAGNOSIS — M79651 Pain in right thigh: Secondary | ICD-10-CM | POA: Diagnosis not present

## 2018-08-31 DIAGNOSIS — Z6833 Body mass index (BMI) 33.0-33.9, adult: Secondary | ICD-10-CM | POA: Diagnosis not present

## 2018-08-31 DIAGNOSIS — M25472 Effusion, left ankle: Secondary | ICD-10-CM | POA: Diagnosis not present

## 2018-08-31 DIAGNOSIS — N183 Chronic kidney disease, stage 3 (moderate): Secondary | ICD-10-CM | POA: Diagnosis not present

## 2018-09-17 DIAGNOSIS — Z7952 Long term (current) use of systemic steroids: Secondary | ICD-10-CM | POA: Diagnosis not present

## 2018-09-17 DIAGNOSIS — E119 Type 2 diabetes mellitus without complications: Secondary | ICD-10-CM | POA: Diagnosis not present

## 2018-09-17 DIAGNOSIS — N182 Chronic kidney disease, stage 2 (mild): Secondary | ICD-10-CM | POA: Diagnosis not present

## 2018-09-17 DIAGNOSIS — L821 Other seborrheic keratosis: Secondary | ICD-10-CM | POA: Diagnosis not present

## 2018-09-17 DIAGNOSIS — I10 Essential (primary) hypertension: Secondary | ICD-10-CM | POA: Diagnosis not present

## 2018-09-17 DIAGNOSIS — H903 Sensorineural hearing loss, bilateral: Secondary | ICD-10-CM | POA: Diagnosis not present

## 2018-09-17 DIAGNOSIS — L820 Inflamed seborrheic keratosis: Secondary | ICD-10-CM | POA: Diagnosis not present

## 2018-10-01 DIAGNOSIS — I129 Hypertensive chronic kidney disease with stage 1 through stage 4 chronic kidney disease, or unspecified chronic kidney disease: Secondary | ICD-10-CM | POA: Diagnosis not present

## 2018-10-01 DIAGNOSIS — Z7952 Long term (current) use of systemic steroids: Secondary | ICD-10-CM | POA: Diagnosis not present

## 2018-10-01 DIAGNOSIS — Z79899 Other long term (current) drug therapy: Secondary | ICD-10-CM | POA: Diagnosis not present

## 2018-10-01 DIAGNOSIS — E1122 Type 2 diabetes mellitus with diabetic chronic kidney disease: Secondary | ICD-10-CM | POA: Diagnosis not present

## 2018-10-01 DIAGNOSIS — N182 Chronic kidney disease, stage 2 (mild): Secondary | ICD-10-CM | POA: Diagnosis not present

## 2018-10-01 DIAGNOSIS — H905 Unspecified sensorineural hearing loss: Secondary | ICD-10-CM | POA: Diagnosis not present

## 2018-10-01 DIAGNOSIS — H903 Sensorineural hearing loss, bilateral: Secondary | ICD-10-CM | POA: Diagnosis not present

## 2018-10-01 DIAGNOSIS — E669 Obesity, unspecified: Secondary | ICD-10-CM | POA: Diagnosis not present

## 2018-10-01 DIAGNOSIS — Z888 Allergy status to other drugs, medicaments and biological substances status: Secondary | ICD-10-CM | POA: Diagnosis not present

## 2018-10-01 DIAGNOSIS — K219 Gastro-esophageal reflux disease without esophagitis: Secondary | ICD-10-CM | POA: Diagnosis not present

## 2018-10-01 DIAGNOSIS — N183 Chronic kidney disease, stage 3 (moderate): Secondary | ICD-10-CM | POA: Diagnosis not present

## 2018-10-23 DIAGNOSIS — Z79899 Other long term (current) drug therapy: Secondary | ICD-10-CM | POA: Diagnosis not present

## 2018-10-23 DIAGNOSIS — N183 Chronic kidney disease, stage 3 (moderate): Secondary | ICD-10-CM | POA: Diagnosis not present

## 2018-10-23 DIAGNOSIS — R29898 Other symptoms and signs involving the musculoskeletal system: Secondary | ICD-10-CM | POA: Diagnosis not present

## 2018-10-23 DIAGNOSIS — M3322 Polymyositis with myopathy: Secondary | ICD-10-CM | POA: Diagnosis not present

## 2018-10-23 DIAGNOSIS — Z7952 Long term (current) use of systemic steroids: Secondary | ICD-10-CM | POA: Diagnosis not present

## 2018-10-23 DIAGNOSIS — M25472 Effusion, left ankle: Secondary | ICD-10-CM | POA: Diagnosis not present

## 2018-10-23 DIAGNOSIS — M150 Primary generalized (osteo)arthritis: Secondary | ICD-10-CM | POA: Diagnosis not present

## 2018-10-23 DIAGNOSIS — M79651 Pain in right thigh: Secondary | ICD-10-CM | POA: Diagnosis not present

## 2018-10-23 DIAGNOSIS — H9193 Unspecified hearing loss, bilateral: Secondary | ICD-10-CM | POA: Diagnosis not present

## 2018-10-23 DIAGNOSIS — G5603 Carpal tunnel syndrome, bilateral upper limbs: Secondary | ICD-10-CM | POA: Diagnosis not present

## 2018-10-24 ENCOUNTER — Emergency Department (HOSPITAL_COMMUNITY)
Admission: EM | Admit: 2018-10-24 | Discharge: 2018-10-24 | Disposition: A | Discharge status: Home / Self Care | Payer: Medicare Other | Attending: Emergency Medicine | Admitting: Emergency Medicine

## 2018-10-24 ENCOUNTER — Other Ambulatory Visit: Payer: Self-pay

## 2018-10-24 ENCOUNTER — Emergency Department (HOSPITAL_COMMUNITY): Payer: Medicare Other

## 2018-10-24 ENCOUNTER — Encounter (HOSPITAL_COMMUNITY): Payer: Self-pay | Admitting: Emergency Medicine

## 2018-10-24 DIAGNOSIS — R600 Localized edema: Secondary | ICD-10-CM | POA: Insufficient documentation

## 2018-10-24 DIAGNOSIS — D649 Anemia, unspecified: Secondary | ICD-10-CM | POA: Diagnosis not present

## 2018-10-24 DIAGNOSIS — R42 Dizziness and giddiness: Secondary | ICD-10-CM | POA: Insufficient documentation

## 2018-10-24 DIAGNOSIS — I959 Hypotension, unspecified: Secondary | ICD-10-CM | POA: Diagnosis not present

## 2018-10-24 DIAGNOSIS — R000 Tachycardia, unspecified: Secondary | ICD-10-CM | POA: Diagnosis not present

## 2018-10-24 DIAGNOSIS — N183 Chronic kidney disease, stage 3 (moderate): Secondary | ICD-10-CM | POA: Insufficient documentation

## 2018-10-24 DIAGNOSIS — D72819 Decreased white blood cell count, unspecified: Secondary | ICD-10-CM | POA: Diagnosis not present

## 2018-10-24 DIAGNOSIS — D61818 Other pancytopenia: Secondary | ICD-10-CM | POA: Insufficient documentation

## 2018-10-24 DIAGNOSIS — I129 Hypertensive chronic kidney disease with stage 1 through stage 4 chronic kidney disease, or unspecified chronic kidney disease: Secondary | ICD-10-CM | POA: Diagnosis not present

## 2018-10-24 DIAGNOSIS — Z79899 Other long term (current) drug therapy: Secondary | ICD-10-CM | POA: Diagnosis not present

## 2018-10-24 DIAGNOSIS — D696 Thrombocytopenia, unspecified: Secondary | ICD-10-CM | POA: Diagnosis not present

## 2018-10-24 DIAGNOSIS — I499 Cardiac arrhythmia, unspecified: Secondary | ICD-10-CM | POA: Diagnosis not present

## 2018-10-24 DIAGNOSIS — R55 Syncope and collapse: Secondary | ICD-10-CM | POA: Diagnosis not present

## 2018-10-24 LAB — BASIC METABOLIC PANEL
Anion gap: 11 (ref 5–15)
BUN: 20 mg/dL (ref 8–23)
CALCIUM: 8.3 mg/dL — AB (ref 8.9–10.3)
CHLORIDE: 100 mmol/L (ref 98–111)
CO2: 26 mmol/L (ref 22–32)
CREATININE: 1.4 mg/dL — AB (ref 0.44–1.00)
GFR calc Af Amer: 40 mL/min — ABNORMAL LOW (ref >60)
GFR, EST NON AFRICAN AMERICAN: 35 mL/min — AB (ref >60)
Glucose, Bld: 154 mg/dL — ABNORMAL HIGH (ref 70–99)
POTASSIUM: 4.1 mmol/L (ref 3.5–5.1)
SODIUM: 137 mmol/L (ref 135–145)

## 2018-10-24 LAB — HEPATIC FUNCTION PANEL
ALT: 26 U/L (ref 0–44)
AST: 27 U/L (ref 15–41)
Albumin: 2.7 g/dL — ABNORMAL LOW (ref 3.5–5.0)
Alkaline Phosphatase: 45 U/L (ref 38–126)
Bilirubin, Direct: 0.2 mg/dL (ref 0.0–0.2)
Indirect Bilirubin: 0.8 mg/dL (ref 0.3–0.9)
Total Bilirubin: 1 mg/dL (ref 0.3–1.2)
Total Protein: 5.2 g/dL — ABNORMAL LOW (ref 6.5–8.1)

## 2018-10-24 LAB — CBC WITH DIFFERENTIAL/PLATELET
ABS IMMATURE GRANULOCYTES: 0.02 10*3/uL (ref 0.00–0.07)
Abs Immature Granulocytes: 0.02 10*3/uL (ref 0.00–0.07)
BASOS ABS: 0 10*3/uL (ref 0.0–0.1)
BASOS PCT: 0 %
Basophils Absolute: 0 10*3/uL (ref 0.0–0.1)
Basophils Relative: 0 %
EOS ABS: 0 10*3/uL (ref 0.0–0.5)
EOS ABS: 0 10*3/uL (ref 0.0–0.5)
Eosinophils Relative: 1 %
Eosinophils Relative: 1 %
HCT: 23.6 % — ABNORMAL LOW (ref 36.0–46.0)
HCT: 27 % — ABNORMAL LOW (ref 36.0–46.0)
HEMOGLOBIN: 7.9 g/dL — AB (ref 12.0–15.0)
HEMOGLOBIN: 8.9 g/dL — AB (ref 12.0–15.0)
Immature Granulocytes: 1 %
Immature Granulocytes: 1 %
LYMPHS ABS: 0.4 10*3/uL — AB (ref 0.7–4.0)
LYMPHS PCT: 24 %
Lymphocytes Relative: 15 %
Lymphs Abs: 0.2 10*3/uL — ABNORMAL LOW (ref 0.7–4.0)
MCH: 30.9 pg (ref 26.0–34.0)
MCH: 30.5 pg (ref 26.0–34.0)
MCHC: 33.5 g/dL (ref 30.0–36.0)
MCHC: 33 g/dL (ref 30.0–36.0)
MCV: 92.2 fL (ref 80.0–100.0)
MCV: 92.5 fL (ref 80.0–100.0)
Monocytes Absolute: 0.1 10*3/uL (ref 0.1–1.0)
Monocytes Absolute: 0.1 10*3/uL (ref 0.1–1.0)
Monocytes Relative: 7 %
Monocytes Relative: 8 %
NEUTROS PCT: 66 %
NRBC: 0 % (ref 0.0–0.2)
Neutro Abs: 1.2 10*3/uL — ABNORMAL LOW (ref 1.7–7.7)
Neutro Abs: 1 10*3/uL — ABNORMAL LOW (ref 1.7–7.7)
Neutrophils Relative %: 76 %
PLATELETS: 54 10*3/uL — AB (ref 150–400)
Platelets: 55 10*3/uL — ABNORMAL LOW (ref 150–400)
RBC: 2.56 MIL/uL — AB (ref 3.87–5.11)
RBC: 2.92 MIL/uL — ABNORMAL LOW (ref 3.87–5.11)
RDW: 14.7 % (ref 11.5–15.5)
RDW: 14.8 % (ref 11.5–15.5)
WBC MORPHOLOGY: ABNORMAL
WBC: 1.5 10*3/uL — ABNORMAL LOW (ref 4.0–10.5)
WBC: 1.5 10*3/uL — AB (ref 4.0–10.5)
nRBC: 0 % (ref 0.0–0.2)

## 2018-10-24 LAB — BRAIN NATRIURETIC PEPTIDE: B NATRIURETIC PEPTIDE 5: 117.5 pg/mL — AB (ref 0.0–100.0)

## 2018-10-24 LAB — IRON AND TIBC
IRON: 190 ug/dL — AB (ref 28–170)
Saturation Ratios: 86 % — ABNORMAL HIGH (ref 10.4–31.8)
TIBC: 221 ug/dL — ABNORMAL LOW (ref 250–450)
UIBC: 31 ug/dL

## 2018-10-24 LAB — TROPONIN I

## 2018-10-24 LAB — FOLATE: FOLATE: 14.2 ng/mL (ref >5.9)

## 2018-10-24 LAB — POC OCCULT BLOOD, ED: Fecal Occult Bld: NEGATIVE

## 2018-10-24 LAB — FERRITIN: FERRITIN: 473 ng/mL — AB (ref 11–307)

## 2018-10-24 LAB — TYPE AND SCREEN
ABO/RH(D): A POS
Antibody Screen: NEGATIVE

## 2018-10-24 LAB — VITAMIN B12: Vitamin B-12: 333 pg/mL (ref 180–914)

## 2018-10-24 LAB — CBG MONITORING, ED: Glucose-Capillary: 111 mg/dL — ABNORMAL HIGH (ref 70–99)

## 2018-10-24 LAB — ABO/RH: ABO/RH(D): A POS

## 2018-10-24 MED ORDER — MECLIZINE HCL 25 MG PO TABS
25.0000 mg | ORAL_TABLET | Freq: Once | ORAL | Status: AC
  Administered 2018-10-24: 25 mg via ORAL
  Filled 2018-10-24: qty 1

## 2018-10-24 NOTE — ED Triage Notes (Addendum)
Pt in from home via [facility] with syncope today while sitting, and near syncope and dizziness x 2 wks. States she had a recent cochlear implant placed around that time, also has been changed to Losartan for BP x 1 wk. Denies any other syncopal events, cp or sob, but feels incr dizziness when standing. States she has not eaten this am, CBG 138

## 2018-10-24 NOTE — Consult Note (Signed)
85 year old female No relevant past medical history other than a gallbladder surgery many years ago Recent cochlear implant which is not apparently working Comes into the emergency room with dizziness and discomfort Orthostatics were mildly +  Troponin neg bnp slightly up Note DImer 2014 ? also show ed Nonspecific low attenuation focus centrally in liver 13 x 8 mm, incompletely imaged.   Ordered LFT, CBC   Her repeat CBC came back as showing a similar pancytopenia She has not lost any weight recently she is doing fairly well she is very very hard of hearing and it is difficult to get a good history from her  She has no chest pain no fever no weight loss no blurred vision no double vision no unilateral weakness she ambulates well and lives with a son at home  She was never smoker never drinker and does not take any illicits  On exam EOMI NCAT no distress S1-S2 no murmur rub or gallop Abdomen soft nontender nondistended no rebound or guarding  Assessment plan Patient tells me she is followed at [facility] and is worried when I tell her that her lab levels are low There is a 27% incidence of pancytopenia and other cell dyscrasias with azathioprine so that should be discontinued In 2017 she had a nonspecific attenuated liver mass which should be followed up by her PCP  This is a no charge note

## 2018-10-24 NOTE — ED Notes (Signed)
Johnna Acosta, patient's son wants to leave his number to be contacted with an update [phone])

## 2018-10-24 NOTE — Discharge Instructions (Signed)
Please discontinue your azathioprine as discussed

## 2018-10-24 NOTE — ED Provider Notes (Signed)
Pt seen and evaluated by admitting MD, Dr Verlon Au and felt stable for discharge.  He recommends discontinuation of the azathioprine  Please see consultation note for complete details.  I have not personally seen or evaluated this patient.  I assisted with the discharge process through [facility], MD 10/24/18 [id number]

## 2018-10-24 NOTE — ED Notes (Signed)
Patient transported to CT 

## 2018-10-24 NOTE — ED Notes (Signed)
EKG given to EDP.  

## 2018-10-24 NOTE — ED Provider Notes (Signed)
[facility] DEPARTMENT Provider Note   CSN: [id number] Arrival date & time: 10/24/18  1103    History   Chief Complaint Chief Complaint  Patient presents with  . Near Syncope  . Dizziness    HPI Heather Olson is a 85 y.o. female with a past medical history of HTN, GERD, CKD III, s/p R cochlear implant on 10/01/18 for SNHL who presents to ED for syncope, dizziness, "funny feeling" in chest for the past 2 weeks. She felt fine after her surgery.  She was told that she could have dizziness for up to 1 month after her surgery.  Two weeks ago she began feeling generally weak, felt dizzy and like "I was going to pass out anytime I walked."  She switched to a new blood pressure medication around the same time of her surgery. She is unable to recall the name of medications but states that the pharmacist switched her from a green pill to a white pill.  She states that she has not fallen or hit her head but did have a syncopal episode while sitting on the couch today.  States that family member had to shake her to wake her up.  States that she has a "funny feeling" in her chest but denies any chest pain.  She denies any headache, vision changes, shortness of breath.  She notes that both of her legs have been swollen.  Denies any vomiting.  States that she has had normal p.o. intake.  She was never given anything to help with the dizziness. Patient is on chronic prednisone for her polymyositis.     HPI  Past Medical History:  Diagnosis Date  . Arthritis   . GERD (gastroesophageal reflux disease)   . Headache(784.0)   . Hypertension   . Swelling of both ankles     Patient Active Problem List   Diagnosis Date Noted  . Postop check 06/15/2013  . Cholelithiases 05/11/2013    Past Surgical History:  Procedure Laterality Date  . ABDOMINAL HYSTERECTOMY    . APPENDECTOMY    . CHOLECYSTECTOMY N/A 05/28/2013   Procedure: LAPAROSCOPIC CHOLECYSTECTOMY ;  Surgeon: Gwenyth Ober, MD;  Location: [facility];  Service: General;  Laterality: N/A;  . TONSILLECTOMY       OB History   No obstetric history on file.      Home Medications    Prior to Admission medications   Medication Sig Start Date End Date Taking? Authorizing Provider  divalproex (DEPAKOTE) 250 MG DR tablet Take 750 mg by mouth daily. For occipital neuralgia 03/13/18 03/13/19 Yes [provider]  azaTHIOprine (IMURAN) 50 MG tablet Take 100 mg by mouth 2 (two) times daily.     [provider]  hydrochlorothiazide (HYDRODIURIL) 25 MG tablet Take 25 mg by mouth daily.    [provider]  losartan (COZAAR) 50 MG tablet Take 50 mg by mouth daily.    [provider]  omeprazole (PRILOSEC) 40 MG capsule Take 40 mg by mouth 2 (two) times daily.     [provider]  predniSONE (DELTASONE) 5 MG tablet Take 5 mg by mouth daily.    [provider]  traMADol (ULTRAM) 50 MG tablet Take 0.5 tablets (25 mg total) by mouth every 6 (six) hours as needed for severe pain. Patient not taking: Reported on 01/13/2018 12/13/17   Tereasa Coop, PA-C    Family History Family History  Problem Relation Age of Onset  . Diabetes Mother  Social History Social History   Tobacco Use  . Smoking status: Never Smoker  . Smokeless tobacco: Never Used  Substance Use Topics  . Alcohol use: No  . Drug use: No     Allergies   Butalbital-apap-caffeine and Lisinopril   Review of Systems Review of Systems  Constitutional: Positive for fatigue. Negative for appetite change, chills and fever.  HENT: Negative for ear pain, rhinorrhea, sneezing and sore throat.   Eyes: Negative for photophobia and visual disturbance.  Respiratory: Negative for cough, chest tightness, shortness of breath and wheezing.   Cardiovascular: Positive for chest pain. Negative for palpitations.  Gastrointestinal: Negative for abdominal pain, blood in stool, constipation, diarrhea, nausea and  vomiting.  Genitourinary: Negative for dysuria, hematuria and urgency.  Musculoskeletal: Negative for myalgias.  Skin: Negative for rash.  Neurological: Positive for dizziness and syncope. Negative for weakness and light-headedness.     Physical Exam Updated Vital Signs BP 132/61   Pulse 69   Temp 98.7 F (37.1 C) (Oral)   Resp 14   Wt 84.4 kg   SpO2 98%   BMI 31.94 kg/m   Physical Exam Vitals signs and nursing note reviewed. Exam conducted with a chaperone present.  Constitutional:      General: She is not in acute distress.    Appearance: She is well-developed.  HENT:     Head: Normocephalic and atraumatic.     Ears:     Comments: HOH.    Nose: Nose normal.  Eyes:     General: No scleral icterus.       Right eye: No discharge.        Left eye: No discharge.     Conjunctiva/sclera: Conjunctivae normal.     Pupils: Pupils are equal, round, and reactive to light.  Neck:     Musculoskeletal: Normal range of motion and neck supple.  Cardiovascular:     Rate and Rhythm: Normal rate and regular rhythm.     Heart sounds: Normal heart sounds. No murmur. No friction rub. No gallop.   Pulmonary:     Effort: Pulmonary effort is normal. No respiratory distress.     Breath sounds: Normal breath sounds.  Abdominal:     General: Bowel sounds are normal. There is no distension.     Palpations: Abdomen is soft.     Tenderness: There is no abdominal tenderness. There is no guarding.  Genitourinary:    Rectum: Guaiac result negative. No external hemorrhoid.  Musculoskeletal: Normal range of motion.     Right lower leg: Edema present.     Left lower leg: Edema present.     Comments: Trace pitting edema in BLE. No calf tenderness bilaterally.  Skin:    General: Skin is warm and dry.     Findings: No rash.  Neurological:     General: No focal deficit present.     Mental Status: She is alert and oriented to person, place, and time.     Cranial Nerves: No cranial nerve deficit.      Sensory: No sensory deficit.     Motor: No weakness or abnormal muscle tone.     Coordination: Coordination normal.     Comments: Alert, oriented to self, place, time and situation. Pupils reactive. No facial asymmetry noted. Cranial nerves appear grossly intact. Sensation intact to light touch on face, BUE and BLE. Strength 5/5 in BUE and BLE.      ED Treatments / Results  Labs (all labs ordered are listed,  but only abnormal results are displayed) Labs Reviewed  BASIC METABOLIC PANEL - Abnormal; Notable for the following components:      Result Value   Glucose, Bld 154 (*)    Creatinine, Ser 1.40 (*)    Calcium 8.3 (*)    GFR calc non Af Amer 35 (*)    GFR calc Af Amer 40 (*)    All other components within normal limits  CBC WITH DIFFERENTIAL/PLATELET - Abnormal; Notable for the following components:   WBC 1.5 (*)    RBC 2.56 (*)    Hemoglobin 7.9 (*)    HCT 23.6 (*)    Platelets 55 (*)    Neutro Abs 1.2 (*)    Lymphs Abs 0.2 (*)    All other components within normal limits  BRAIN NATRIURETIC PEPTIDE - Abnormal; Notable for the following components:   B Natriuretic Peptide 117.5 (*)    All other components within normal limits  CBG MONITORING, ED - Abnormal; Notable for the following components:   Glucose-Capillary 111 (*)    All other components within normal limits  TROPONIN I  CBC WITH DIFFERENTIAL/PLATELET  VITAMIN B12  FOLATE  IRON AND TIBC  FERRITIN  RETICULOCYTES  POC OCCULT BLOOD, ED  TYPE AND SCREEN  ABO/RH    EKG EKG Interpretation  Date/Time:  Saturday October 24 2018 11:05:35 EDT Ventricular Rate:  78 PR Interval:    QRS Duration: 72 QT Interval:  323 QTC Calculation: 368 R Axis:   9 Text Interpretation:  Sinus rhythm Borderline T wave abnormalities No significant change since last tracing Confirmed by Gareth Morgan [phone]) on 10/24/2018 1:19:17 PM   Radiology Dg Chest 2 View  Result Date: 10/24/2018 CLINICAL DATA:  Syncope and dizziness  when standing. EXAM: CHEST - 2 VIEW COMPARISON:  None. FINDINGS: The heart size and mediastinal contours are within normal limits. The lungs demonstrate mild bibasilar atelectasis. There is no evidence of pulmonary edema, consolidation, pneumothorax, nodule or pleural fluid. The visualized skeletal structures are unremarkable. IMPRESSION: No active cardiopulmonary disease. Electronically Signed   By: Aletta Edouard M.D.   On: 10/24/2018 12:30   Ct Head Wo Contrast  Result Date: 10/24/2018 CLINICAL DATA:  Near syncope. Dizziness for 2 weeks. Recent cochlear implant placement. EXAM: CT HEAD WITHOUT CONTRAST TECHNIQUE: Contiguous axial images were obtained from the base of the skull through the vertex without intravenous contrast. COMPARISON:  01/13/2018 FINDINGS: Brain: Expected cerebral volume loss for age. Beam hardening artifact from implant. Mild low density in the periventricular white matter likely related to small vessel disease. No mass lesion, hemorrhage, hydrocephalus, acute infarct, intra-axial, or extra-axial fluid collection. Vascular: No hyperdense vessel or unexpected calcification. Skull: Cochlear implant. Sinuses/Orbits: Normal imaged portions of the orbits and globes. Normal paranasal sinuses. Other: None. IMPRESSION: 1. Mild degradation secondary to beam hardening artifact from the cochlear implant. 2. Given this factor, no acute intracranial abnormality. 3.  Cerebral atrophy and small vessel ischemic change. Electronically Signed   By: Abigail Miyamoto M.D.   On: 10/24/2018 12:06    Procedures Procedures (including critical care time)  Medications Ordered in ED Medications  meclizine (ANTIVERT) tablet 25 mg (25 mg Oral Given 10/24/18 1337)     Initial Impression / Assessment and Plan / ED Course  I have reviewed the triage vital signs and the nursing notes.  Pertinent labs & imaging results that were available during my care of the patient were reviewed by me and considered in my  medical decision making (  see chart for details).  Clinical Course as of Oct 24 1527  Sat Oct 24, 2018  1130 WBC(!): 1.5 [HK]  1130 Platelets(!): 55 [HK]  1243 Last hgb 11.8 on 02/2018.  Hemoglobin(!): 7.9 [HK]  1336 Fecal Occult Blood, POC: NEGATIVE [HK]    Clinical Course User Index [HK] Delia Heady, [place]       85yo F with a past medical history of HTN, GERD, CKD III, s/p R cochlear implant on 10/01/2018 presents to ED for syncope, dizziness and funny feeling in her chest for the past 2 weeks.  She was told she can be dizzy up to 1 month after her surgery.  Her cochlear implant has not been used yet.  She states that she had a syncopal episode today while she was sitting on the couch.  She denies any head injury or loss of consciousness.  She did switch to a new blood pressure medication per her pharmacist around the same time of her surgery.  She denies any chest pain, shortness of breath, headache, vision changes or bleeding.  On my exam she is overall well-appearing.  No deficits neurological exam noted.  Abdomen is soft, nontender nondistended.  Vital signs are within normal limits.  Denies any changes to p.o. intake.  Lab work significant for pancytopenia, low hemoglobin of 7.9.  Prior comparison was August 2019 when it was 11.  Hemoccult is negative.  Chest x-ray is unremarkable CT of the head is negative for acute abnormality.  EKG shows no changes from prior tracings.  Suspect that symptoms are due to symptomatic anemia.  Due to her low hemoglobin, pancytopenia will admit for further management.  Patient has been on chronic prednisone therapy for polymyositis.  3:28 PM Spoke to hospitalist Dr. Verlon Au.  He asked that we redraw her labs.  If still are abnormal and pancytopenic, he will admit the patient. Care handed off to [facility].  Patient discussed with and seen by my attending, Dr. Billy Fischer.  Final Clinical Impressions(s) / ED Diagnoses   Final diagnoses:  Symptomatic anemia   Pancytopenia [facility])    ED Discharge Orders    None       Delia Heady, PA-C 10/24/18 1529    Gareth Morgan, MD 10/25/18 [phone]

## 2018-10-26 LAB — RETICULOCYTES
Immature Retic Fract: 2.2 % — ABNORMAL LOW (ref 2.3–15.9)
RBC.: 2.92 MIL/uL — ABNORMAL LOW (ref 3.87–5.11)
Retic Count, Absolute: 3.5 10*3/uL — ABNORMAL LOW (ref 19.0–186.0)
Retic Ct Pct: <0.4 % — ABNORMAL LOW (ref 0.4–3.1)

## 2018-10-26 LAB — PATHOLOGIST SMEAR REVIEW

## 2018-10-30 DIAGNOSIS — H903 Sensorineural hearing loss, bilateral: Secondary | ICD-10-CM | POA: Diagnosis not present

## 2018-10-30 DIAGNOSIS — M48062 Spinal stenosis, lumbar region with neurogenic claudication: Secondary | ICD-10-CM | POA: Diagnosis present

## 2018-10-30 DIAGNOSIS — R410 Disorientation, unspecified: Secondary | ICD-10-CM | POA: Diagnosis not present

## 2018-10-30 DIAGNOSIS — D61818 Other pancytopenia: Secondary | ICD-10-CM | POA: Diagnosis present

## 2018-10-30 DIAGNOSIS — M5116 Intervertebral disc disorders with radiculopathy, lumbar region: Secondary | ICD-10-CM | POA: Diagnosis present

## 2018-10-30 DIAGNOSIS — N182 Chronic kidney disease, stage 2 (mild): Secondary | ICD-10-CM | POA: Diagnosis present

## 2018-10-30 DIAGNOSIS — M3320 Polymyositis, organ involvement unspecified: Secondary | ICD-10-CM | POA: Diagnosis present

## 2018-10-30 DIAGNOSIS — Z90710 Acquired absence of both cervix and uterus: Secondary | ICD-10-CM | POA: Diagnosis not present

## 2018-10-30 DIAGNOSIS — I959 Hypotension, unspecified: Secondary | ICD-10-CM | POA: Diagnosis not present

## 2018-10-30 DIAGNOSIS — R404 Transient alteration of awareness: Secondary | ICD-10-CM | POA: Diagnosis not present

## 2018-10-30 DIAGNOSIS — M4726 Other spondylosis with radiculopathy, lumbar region: Secondary | ICD-10-CM | POA: Diagnosis present

## 2018-10-30 DIAGNOSIS — I10 Essential (primary) hypertension: Secondary | ICD-10-CM | POA: Diagnosis not present

## 2018-10-30 DIAGNOSIS — R0902 Hypoxemia: Secondary | ICD-10-CM | POA: Diagnosis not present

## 2018-10-30 DIAGNOSIS — G459 Transient cerebral ischemic attack, unspecified: Secondary | ICD-10-CM | POA: Diagnosis not present

## 2018-10-30 DIAGNOSIS — E1122 Type 2 diabetes mellitus with diabetic chronic kidney disease: Secondary | ICD-10-CM | POA: Diagnosis not present

## 2018-10-30 DIAGNOSIS — R42 Dizziness and giddiness: Secondary | ICD-10-CM | POA: Diagnosis not present

## 2018-10-30 DIAGNOSIS — K219 Gastro-esophageal reflux disease without esophagitis: Secondary | ICD-10-CM | POA: Diagnosis present

## 2018-10-30 DIAGNOSIS — R001 Bradycardia, unspecified: Secondary | ICD-10-CM | POA: Diagnosis not present

## 2018-10-30 DIAGNOSIS — R531 Weakness: Secondary | ICD-10-CM | POA: Diagnosis not present

## 2018-10-30 DIAGNOSIS — M170 Bilateral primary osteoarthritis of knee: Secondary | ICD-10-CM | POA: Diagnosis present

## 2018-10-30 DIAGNOSIS — M4316 Spondylolisthesis, lumbar region: Secondary | ICD-10-CM | POA: Diagnosis present

## 2018-10-30 DIAGNOSIS — M19072 Primary osteoarthritis, left ankle and foot: Secondary | ICD-10-CM | POA: Diagnosis present

## 2018-10-30 DIAGNOSIS — R7302 Impaired glucose tolerance (oral): Secondary | ICD-10-CM | POA: Diagnosis not present

## 2018-10-30 DIAGNOSIS — R55 Syncope and collapse: Secondary | ICD-10-CM | POA: Diagnosis not present

## 2018-10-30 DIAGNOSIS — M5481 Occipital neuralgia: Secondary | ICD-10-CM | POA: Diagnosis present

## 2018-10-30 DIAGNOSIS — E118 Type 2 diabetes mellitus with unspecified complications: Secondary | ICD-10-CM | POA: Diagnosis not present

## 2018-10-30 DIAGNOSIS — N183 Chronic kidney disease, stage 3 (moderate): Secondary | ICD-10-CM | POA: Diagnosis not present

## 2018-10-30 DIAGNOSIS — I129 Hypertensive chronic kidney disease with stage 1 through stage 4 chronic kidney disease, or unspecified chronic kidney disease: Secondary | ICD-10-CM | POA: Diagnosis present

## 2018-10-30 DIAGNOSIS — M2140 Flat foot [pes planus] (acquired), unspecified foot: Secondary | ICD-10-CM | POA: Diagnosis present

## 2018-10-30 DIAGNOSIS — L603 Nail dystrophy: Secondary | ICD-10-CM | POA: Diagnosis not present

## 2018-10-30 DIAGNOSIS — D702 Other drug-induced agranulocytosis: Secondary | ICD-10-CM | POA: Diagnosis not present

## 2018-10-30 DIAGNOSIS — Z9049 Acquired absence of other specified parts of digestive tract: Secondary | ICD-10-CM | POA: Diagnosis not present

## 2018-10-30 DIAGNOSIS — R569 Unspecified convulsions: Secondary | ICD-10-CM | POA: Diagnosis present

## 2018-10-30 DIAGNOSIS — M24572 Contracture, left ankle: Secondary | ICD-10-CM | POA: Diagnosis present

## 2018-10-30 DIAGNOSIS — Z7952 Long term (current) use of systemic steroids: Secondary | ICD-10-CM | POA: Diagnosis not present

## 2018-11-06 DIAGNOSIS — R404 Transient alteration of awareness: Secondary | ICD-10-CM | POA: Diagnosis not present

## 2018-11-12 DIAGNOSIS — H903 Sensorineural hearing loss, bilateral: Secondary | ICD-10-CM | POA: Diagnosis present

## 2018-11-12 DIAGNOSIS — E1122 Type 2 diabetes mellitus with diabetic chronic kidney disease: Secondary | ICD-10-CM | POA: Diagnosis not present

## 2018-11-12 DIAGNOSIS — I129 Hypertensive chronic kidney disease with stage 1 through stage 4 chronic kidney disease, or unspecified chronic kidney disease: Secondary | ICD-10-CM | POA: Diagnosis present

## 2018-11-12 DIAGNOSIS — Z888 Allergy status to other drugs, medicaments and biological substances status: Secondary | ICD-10-CM | POA: Diagnosis not present

## 2018-11-12 DIAGNOSIS — M5481 Occipital neuralgia: Secondary | ICD-10-CM | POA: Diagnosis present

## 2018-11-12 DIAGNOSIS — N182 Chronic kidney disease, stage 2 (mild): Secondary | ICD-10-CM | POA: Diagnosis present

## 2018-11-12 DIAGNOSIS — R55 Syncope and collapse: Secondary | ICD-10-CM | POA: Diagnosis not present

## 2018-11-12 DIAGNOSIS — Z7952 Long term (current) use of systemic steroids: Secondary | ICD-10-CM | POA: Diagnosis not present

## 2018-11-12 DIAGNOSIS — R569 Unspecified convulsions: Secondary | ICD-10-CM | POA: Diagnosis not present

## 2018-11-12 DIAGNOSIS — D469 Myelodysplastic syndrome, unspecified: Secondary | ICD-10-CM | POA: Diagnosis present

## 2018-11-12 DIAGNOSIS — K219 Gastro-esophageal reflux disease without esophagitis: Secondary | ICD-10-CM | POA: Diagnosis present

## 2018-11-12 DIAGNOSIS — D61811 Other drug-induced pancytopenia: Secondary | ICD-10-CM | POA: Diagnosis not present

## 2018-11-12 DIAGNOSIS — R4182 Altered mental status, unspecified: Secondary | ICD-10-CM | POA: Diagnosis not present

## 2018-11-12 DIAGNOSIS — I10 Essential (primary) hypertension: Secondary | ICD-10-CM | POA: Diagnosis not present

## 2018-11-12 DIAGNOSIS — Z79899 Other long term (current) drug therapy: Secondary | ICD-10-CM | POA: Diagnosis not present

## 2018-11-12 DIAGNOSIS — Z9621 Cochlear implant status: Secondary | ICD-10-CM | POA: Diagnosis present

## 2018-11-12 DIAGNOSIS — D6189 Other specified aplastic anemias and other bone marrow failure syndromes: Secondary | ICD-10-CM | POA: Diagnosis not present

## 2018-11-12 DIAGNOSIS — E119 Type 2 diabetes mellitus without complications: Secondary | ICD-10-CM | POA: Diagnosis not present

## 2018-11-12 DIAGNOSIS — D649 Anemia, unspecified: Secondary | ICD-10-CM | POA: Diagnosis not present

## 2018-11-12 DIAGNOSIS — E872 Acidosis: Secondary | ICD-10-CM | POA: Diagnosis present

## 2018-11-12 DIAGNOSIS — D61818 Other pancytopenia: Secondary | ICD-10-CM | POA: Diagnosis present

## 2018-11-12 DIAGNOSIS — D696 Thrombocytopenia, unspecified: Secondary | ICD-10-CM | POA: Diagnosis not present

## 2018-11-12 DIAGNOSIS — N183 Chronic kidney disease, stage 3 (moderate): Secondary | ICD-10-CM | POA: Diagnosis not present

## 2018-11-12 DIAGNOSIS — M3320 Polymyositis, organ involvement unspecified: Secondary | ICD-10-CM | POA: Diagnosis present

## 2018-11-18 DIAGNOSIS — R404 Transient alteration of awareness: Secondary | ICD-10-CM | POA: Diagnosis not present

## 2018-11-23 DIAGNOSIS — I10 Essential (primary) hypertension: Secondary | ICD-10-CM | POA: Diagnosis not present

## 2018-11-23 DIAGNOSIS — D469 Myelodysplastic syndrome, unspecified: Secondary | ICD-10-CM | POA: Diagnosis not present

## 2018-11-30 DIAGNOSIS — D469 Myelodysplastic syndrome, unspecified: Secondary | ICD-10-CM | POA: Diagnosis not present

## 2018-12-01 DIAGNOSIS — D469 Myelodysplastic syndrome, unspecified: Secondary | ICD-10-CM | POA: Diagnosis not present

## 2018-12-02 DIAGNOSIS — D61818 Other pancytopenia: Secondary | ICD-10-CM | POA: Diagnosis not present

## 2018-12-02 DIAGNOSIS — R600 Localized edema: Secondary | ICD-10-CM | POA: Diagnosis not present

## 2018-12-02 DIAGNOSIS — Z7952 Long term (current) use of systemic steroids: Secondary | ICD-10-CM | POA: Diagnosis not present

## 2018-12-02 DIAGNOSIS — D469 Myelodysplastic syndrome, unspecified: Secondary | ICD-10-CM | POA: Diagnosis not present

## 2018-12-02 DIAGNOSIS — M3320 Polymyositis, organ involvement unspecified: Secondary | ICD-10-CM | POA: Diagnosis not present

## 2018-12-09 DIAGNOSIS — D469 Myelodysplastic syndrome, unspecified: Secondary | ICD-10-CM | POA: Diagnosis not present

## 2018-12-14 DIAGNOSIS — Z6710 Type A blood, Rh positive: Secondary | ICD-10-CM | POA: Diagnosis not present

## 2018-12-14 DIAGNOSIS — D469 Myelodysplastic syndrome, unspecified: Secondary | ICD-10-CM | POA: Diagnosis not present

## 2018-12-14 DIAGNOSIS — H903 Sensorineural hearing loss, bilateral: Secondary | ICD-10-CM | POA: Diagnosis not present

## 2018-12-23 DIAGNOSIS — Z6710 Type A blood, Rh positive: Secondary | ICD-10-CM | POA: Diagnosis not present

## 2018-12-23 DIAGNOSIS — D46A Refractory cytopenia with multilineage dysplasia: Secondary | ICD-10-CM | POA: Diagnosis not present

## 2018-12-23 DIAGNOSIS — R5383 Other fatigue: Secondary | ICD-10-CM | POA: Diagnosis not present

## 2018-12-23 DIAGNOSIS — D469 Myelodysplastic syndrome, unspecified: Secondary | ICD-10-CM | POA: Diagnosis not present

## 2018-12-23 DIAGNOSIS — N182 Chronic kidney disease, stage 2 (mild): Secondary | ICD-10-CM | POA: Diagnosis not present

## 2018-12-23 DIAGNOSIS — Z79899 Other long term (current) drug therapy: Secondary | ICD-10-CM | POA: Diagnosis not present

## 2018-12-25 DIAGNOSIS — D469 Myelodysplastic syndrome, unspecified: Secondary | ICD-10-CM | POA: Diagnosis not present

## 2019-01-01 DIAGNOSIS — H3500 Unspecified background retinopathy: Secondary | ICD-10-CM | POA: Diagnosis not present

## 2019-01-01 DIAGNOSIS — H52202 Unspecified astigmatism, left eye: Secondary | ICD-10-CM | POA: Diagnosis not present

## 2019-01-01 DIAGNOSIS — H26491 Other secondary cataract, right eye: Secondary | ICD-10-CM | POA: Diagnosis not present

## 2019-01-01 DIAGNOSIS — H04123 Dry eye syndrome of bilateral lacrimal glands: Secondary | ICD-10-CM | POA: Diagnosis not present

## 2019-01-06 DIAGNOSIS — D469 Myelodysplastic syndrome, unspecified: Secondary | ICD-10-CM | POA: Diagnosis not present

## 2019-01-06 DIAGNOSIS — Z6710 Type A blood, Rh positive: Secondary | ICD-10-CM | POA: Diagnosis not present

## 2019-01-08 DIAGNOSIS — D469 Myelodysplastic syndrome, unspecified: Secondary | ICD-10-CM | POA: Diagnosis not present

## 2019-01-20 DIAGNOSIS — R5383 Other fatigue: Secondary | ICD-10-CM | POA: Diagnosis not present

## 2019-01-20 DIAGNOSIS — S5011XA Contusion of right forearm, initial encounter: Secondary | ICD-10-CM | POA: Diagnosis not present

## 2019-01-20 DIAGNOSIS — D46Z Other myelodysplastic syndromes: Secondary | ICD-10-CM | POA: Diagnosis not present

## 2019-01-20 DIAGNOSIS — M7989 Other specified soft tissue disorders: Secondary | ICD-10-CM | POA: Diagnosis not present

## 2019-01-20 DIAGNOSIS — D469 Myelodysplastic syndrome, unspecified: Secondary | ICD-10-CM | POA: Diagnosis not present

## 2019-01-20 DIAGNOSIS — N182 Chronic kidney disease, stage 2 (mild): Secondary | ICD-10-CM | POA: Diagnosis not present

## 2019-01-31 DIAGNOSIS — D469 Myelodysplastic syndrome, unspecified: Secondary | ICD-10-CM | POA: Diagnosis not present

## 2019-01-31 DIAGNOSIS — R0602 Shortness of breath: Secondary | ICD-10-CM | POA: Diagnosis not present

## 2019-01-31 DIAGNOSIS — I129 Hypertensive chronic kidney disease with stage 1 through stage 4 chronic kidney disease, or unspecified chronic kidney disease: Secondary | ICD-10-CM | POA: Diagnosis not present

## 2019-01-31 DIAGNOSIS — E1122 Type 2 diabetes mellitus with diabetic chronic kidney disease: Secondary | ICD-10-CM | POA: Diagnosis not present

## 2019-01-31 DIAGNOSIS — N182 Chronic kidney disease, stage 2 (mild): Secondary | ICD-10-CM | POA: Diagnosis not present

## 2019-01-31 DIAGNOSIS — Z20828 Contact with and (suspected) exposure to other viral communicable diseases: Secondary | ICD-10-CM | POA: Diagnosis not present

## 2019-01-31 DIAGNOSIS — I10 Essential (primary) hypertension: Secondary | ICD-10-CM | POA: Diagnosis not present

## 2019-02-03 DIAGNOSIS — D469 Myelodysplastic syndrome, unspecified: Secondary | ICD-10-CM | POA: Diagnosis not present

## 2019-02-03 DIAGNOSIS — R55 Syncope and collapse: Secondary | ICD-10-CM | POA: Diagnosis not present

## 2019-02-03 DIAGNOSIS — K219 Gastro-esophageal reflux disease without esophagitis: Secondary | ICD-10-CM | POA: Diagnosis not present

## 2019-02-03 DIAGNOSIS — Z1159 Encounter for screening for other viral diseases: Secondary | ICD-10-CM | POA: Diagnosis not present

## 2019-02-03 DIAGNOSIS — R569 Unspecified convulsions: Secondary | ICD-10-CM | POA: Diagnosis not present

## 2019-02-03 DIAGNOSIS — M3320 Polymyositis, organ involvement unspecified: Secondary | ICD-10-CM | POA: Diagnosis not present

## 2019-02-03 DIAGNOSIS — E1122 Type 2 diabetes mellitus with diabetic chronic kidney disease: Secondary | ICD-10-CM | POA: Diagnosis not present

## 2019-02-03 DIAGNOSIS — R51 Headache: Secondary | ICD-10-CM | POA: Diagnosis not present

## 2019-02-03 DIAGNOSIS — D61818 Other pancytopenia: Secondary | ICD-10-CM | POA: Diagnosis not present

## 2019-02-04 DIAGNOSIS — R55 Syncope and collapse: Secondary | ICD-10-CM | POA: Diagnosis not present

## 2019-02-04 DIAGNOSIS — R51 Headache: Secondary | ICD-10-CM | POA: Diagnosis not present

## 2019-02-04 DIAGNOSIS — R569 Unspecified convulsions: Secondary | ICD-10-CM | POA: Diagnosis not present

## 2019-02-04 DIAGNOSIS — Z1159 Encounter for screening for other viral diseases: Secondary | ICD-10-CM | POA: Diagnosis not present

## 2019-02-04 DIAGNOSIS — D469 Myelodysplastic syndrome, unspecified: Secondary | ICD-10-CM | POA: Diagnosis not present

## 2019-02-05 DIAGNOSIS — Z886 Allergy status to analgesic agent status: Secondary | ICD-10-CM | POA: Diagnosis not present

## 2019-02-05 DIAGNOSIS — Z7952 Long term (current) use of systemic steroids: Secondary | ICD-10-CM | POA: Diagnosis not present

## 2019-02-05 DIAGNOSIS — M1990 Unspecified osteoarthritis, unspecified site: Secondary | ICD-10-CM | POA: Diagnosis present

## 2019-02-05 DIAGNOSIS — Z5111 Encounter for antineoplastic chemotherapy: Secondary | ICD-10-CM | POA: Diagnosis not present

## 2019-02-05 DIAGNOSIS — E1122 Type 2 diabetes mellitus with diabetic chronic kidney disease: Secondary | ICD-10-CM | POA: Diagnosis present

## 2019-02-05 DIAGNOSIS — H903 Sensorineural hearing loss, bilateral: Secondary | ICD-10-CM | POA: Diagnosis present

## 2019-02-05 DIAGNOSIS — M5136 Other intervertebral disc degeneration, lumbar region: Secondary | ICD-10-CM | POA: Diagnosis present

## 2019-02-05 DIAGNOSIS — I129 Hypertensive chronic kidney disease with stage 1 through stage 4 chronic kidney disease, or unspecified chronic kidney disease: Secondary | ICD-10-CM | POA: Diagnosis present

## 2019-02-05 DIAGNOSIS — M3320 Polymyositis, organ involvement unspecified: Secondary | ICD-10-CM | POA: Diagnosis present

## 2019-02-05 DIAGNOSIS — F329 Major depressive disorder, single episode, unspecified: Secondary | ICD-10-CM | POA: Diagnosis present

## 2019-02-05 DIAGNOSIS — K5901 Slow transit constipation: Secondary | ICD-10-CM | POA: Diagnosis present

## 2019-02-05 DIAGNOSIS — Z1159 Encounter for screening for other viral diseases: Secondary | ICD-10-CM | POA: Diagnosis not present

## 2019-02-05 DIAGNOSIS — D469 Myelodysplastic syndrome, unspecified: Secondary | ICD-10-CM | POA: Diagnosis present

## 2019-02-05 DIAGNOSIS — N182 Chronic kidney disease, stage 2 (mild): Secondary | ICD-10-CM | POA: Diagnosis not present

## 2019-02-05 DIAGNOSIS — Z888 Allergy status to other drugs, medicaments and biological substances status: Secondary | ICD-10-CM | POA: Diagnosis not present

## 2019-02-05 DIAGNOSIS — R509 Fever, unspecified: Secondary | ICD-10-CM | POA: Diagnosis not present

## 2019-02-05 DIAGNOSIS — D630 Anemia in neoplastic disease: Secondary | ICD-10-CM | POA: Diagnosis present

## 2019-02-05 DIAGNOSIS — K219 Gastro-esophageal reflux disease without esophagitis: Secondary | ICD-10-CM | POA: Diagnosis present

## 2019-02-05 DIAGNOSIS — R55 Syncope and collapse: Secondary | ICD-10-CM | POA: Diagnosis present

## 2019-02-05 DIAGNOSIS — Z9621 Cochlear implant status: Secondary | ICD-10-CM | POA: Diagnosis present

## 2019-02-05 DIAGNOSIS — M48061 Spinal stenosis, lumbar region without neurogenic claudication: Secondary | ICD-10-CM | POA: Diagnosis present

## 2019-02-05 DIAGNOSIS — R531 Weakness: Secondary | ICD-10-CM | POA: Diagnosis not present

## 2019-02-05 DIAGNOSIS — N183 Chronic kidney disease, stage 3 (moderate): Secondary | ICD-10-CM | POA: Diagnosis present

## 2019-02-05 DIAGNOSIS — D61818 Other pancytopenia: Secondary | ICD-10-CM | POA: Diagnosis present

## 2019-02-05 DIAGNOSIS — M5481 Occipital neuralgia: Secondary | ICD-10-CM | POA: Diagnosis present

## 2019-02-05 DIAGNOSIS — Z79899 Other long term (current) drug therapy: Secondary | ICD-10-CM | POA: Diagnosis not present

## 2019-02-05 DIAGNOSIS — R51 Headache: Secondary | ICD-10-CM | POA: Diagnosis not present

## 2019-02-15 DIAGNOSIS — R5383 Other fatigue: Secondary | ICD-10-CM | POA: Diagnosis not present

## 2019-02-15 DIAGNOSIS — D469 Myelodysplastic syndrome, unspecified: Secondary | ICD-10-CM | POA: Diagnosis not present

## 2019-02-16 ENCOUNTER — Telehealth: Payer: Self-pay | Admitting: Hematology

## 2019-02-16 NOTE — Telephone Encounter (Signed)
Received a message to schedule Ms. Bekker for labs, MD visit and blood transfusion. With the help of Audie Clear, appts have been added on 7/24 at 12pm for labs, 1230pm to see Dr. Burr Medico and 130pm for blood transfusion. All appts have been provided to the pt's son, Rush Landmark on his voicemail and verbally. He was truly grateful for getting his mom scheduled. I asked that he call if there was anything else I can do to help.

## 2019-02-17 ENCOUNTER — Inpatient Hospital Stay: Payer: Medicare Other | Admitting: Nurse Practitioner

## 2019-02-18 ENCOUNTER — Other Ambulatory Visit: Payer: Self-pay | Admitting: Hematology

## 2019-02-18 DIAGNOSIS — D46Z Other myelodysplastic syndromes: Secondary | ICD-10-CM
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
## 2019-02-19 ENCOUNTER — Inpatient Hospital Stay: Payer: Medicare Other

## 2019-02-19 ENCOUNTER — Inpatient Hospital Stay: Payer: Medicare Other | Attending: Hematology | Admitting: Hematology

## 2019-02-19 ENCOUNTER — Telehealth: Payer: Self-pay | Admitting: Hematology

## 2019-02-19 ENCOUNTER — Encounter: Payer: Self-pay | Admitting: Hematology

## 2019-02-19 ENCOUNTER — Other Ambulatory Visit: Payer: Self-pay

## 2019-02-19 DIAGNOSIS — K219 Gastro-esophageal reflux disease without esophagitis: Secondary | ICD-10-CM | POA: Diagnosis not present

## 2019-02-19 DIAGNOSIS — H9190 Unspecified hearing loss, unspecified ear: Secondary | ICD-10-CM | POA: Diagnosis not present

## 2019-02-19 DIAGNOSIS — I10 Essential (primary) hypertension: Secondary | ICD-10-CM | POA: Insufficient documentation

## 2019-02-19 DIAGNOSIS — Z79899 Other long term (current) drug therapy: Secondary | ICD-10-CM | POA: Insufficient documentation

## 2019-02-19 DIAGNOSIS — M3320 Polymyositis, organ involvement unspecified: Secondary | ICD-10-CM

## 2019-02-19 DIAGNOSIS — D469 Myelodysplastic syndrome, unspecified: Secondary | ICD-10-CM | POA: Insufficient documentation

## 2019-02-19 DIAGNOSIS — D6959 Other secondary thrombocytopenia: Secondary | ICD-10-CM | POA: Diagnosis not present

## 2019-02-19 DIAGNOSIS — D61818 Other pancytopenia: Secondary | ICD-10-CM | POA: Diagnosis not present

## 2019-02-19 DIAGNOSIS — D46Z Other myelodysplastic syndromes: Secondary | ICD-10-CM

## 2019-02-19 LAB — CBC WITH DIFFERENTIAL (CANCER CENTER ONLY)
Abs Immature Granulocytes: 0.01 10*3/uL (ref 0.00–0.07)
Basophils Absolute: 0 10*3/uL (ref 0.0–0.1)
Basophils Relative: 0 %
Eosinophils Absolute: 0 10*3/uL (ref 0.0–0.5)
Eosinophils Relative: 0 %
HCT: 25 % — ABNORMAL LOW (ref 36.0–46.0)
Hemoglobin: 8.6 g/dL — ABNORMAL LOW (ref 12.0–15.0)
Immature Granulocytes: 1 %
Lymphocytes Relative: 40 %
Lymphs Abs: 0.3 10*3/uL — ABNORMAL LOW (ref 0.7–4.0)
MCH: 29.8 pg (ref 26.0–34.0)
MCHC: 34.4 g/dL (ref 30.0–36.0)
MCV: 86.5 fL (ref 80.0–100.0)
Monocytes Absolute: 0 10*3/uL — ABNORMAL LOW (ref 0.1–1.0)
Monocytes Relative: 6 %
Neutro Abs: 0.4 10*3/uL — CL (ref 1.7–7.7)
Neutrophils Relative %: 53 %
Platelet Count: 15 10*3/uL — ABNORMAL LOW (ref 150–400)
RBC: 2.89 MIL/uL — ABNORMAL LOW (ref 3.87–5.11)
RDW: 13.9 % (ref 11.5–15.5)
WBC Count: 0.7 10*3/uL — CL (ref 4.0–10.5)
nRBC: 0 % (ref 0.0–0.2)

## 2019-02-19 LAB — SAMPLE TO BLOOD BANK

## 2019-02-19 LAB — ABO/RH: ABO/RH(D): A POS

## 2019-02-19 MED ORDER — DIPHENHYDRAMINE HCL 25 MG PO CAPS
ORAL_CAPSULE | ORAL | Status: AC
  Filled 2019-02-19: qty 1

## 2019-02-19 MED ORDER — DIPHENHYDRAMINE HCL 25 MG PO CAPS
25.0000 mg | ORAL_CAPSULE | Freq: Once | ORAL | Status: AC
  Administered 2019-02-19: 25 mg via ORAL

## 2019-02-19 MED ORDER — HEPARIN SOD (PORK) LOCK FLUSH 100 UNIT/ML IV SOLN
250.0000 [IU] | INTRAVENOUS | Status: AC | PRN
  Administered 2019-02-19: 500 [IU]
  Filled 2019-02-19: qty 5

## 2019-02-19 MED ORDER — SODIUM CHLORIDE 0.9% IV SOLUTION
250.0000 mL | Freq: Once | INTRAVENOUS | Status: AC
  Administered 2019-02-19: 14:00:00 250 mL via INTRAVENOUS
  Filled 2019-02-19: qty 250

## 2019-02-19 MED ORDER — SODIUM CHLORIDE 0.9% FLUSH
10.0000 mL | INTRAVENOUS | Status: AC | PRN
  Administered 2019-02-19: 10 mL
  Filled 2019-02-19: qty 10

## 2019-02-19 NOTE — Telephone Encounter (Signed)
Scheduled appt per 7/24 los.  Infusion nurse will print out her appt calendar.

## 2019-02-19 NOTE — Progress Notes (Signed)
Per Dr. Burr Medico okay to use port placed by [facility] without getting chest x-ray to confirm placement today.

## 2019-02-19 NOTE — Patient Instructions (Signed)

## 2019-02-20 LAB — BPAM PLATELET PHERESIS
Blood Product Expiration Date: 202007252359
ISSUE DATE / TIME: 202007241428
Unit Type and Rh: 6200

## 2019-02-20 LAB — PREPARE PLATELET PHERESIS: Unit division: 0

## 2019-02-22 ENCOUNTER — Other Ambulatory Visit: Payer: Self-pay

## 2019-02-22 ENCOUNTER — Inpatient Hospital Stay: Payer: Medicare Other

## 2019-02-22 ENCOUNTER — Telehealth: Payer: Self-pay

## 2019-02-22 DIAGNOSIS — Z95828 Presence of other vascular implants and grafts: Secondary | ICD-10-CM

## 2019-02-22 DIAGNOSIS — M3320 Polymyositis, organ involvement unspecified: Secondary | ICD-10-CM | POA: Diagnosis not present

## 2019-02-22 DIAGNOSIS — I10 Essential (primary) hypertension: Secondary | ICD-10-CM | POA: Diagnosis not present

## 2019-02-22 DIAGNOSIS — D6959 Other secondary thrombocytopenia: Secondary | ICD-10-CM | POA: Diagnosis not present

## 2019-02-22 DIAGNOSIS — D46Z Other myelodysplastic syndromes: Secondary | ICD-10-CM

## 2019-02-22 DIAGNOSIS — D469 Myelodysplastic syndrome, unspecified: Secondary | ICD-10-CM | POA: Diagnosis not present

## 2019-02-22 DIAGNOSIS — H9190 Unspecified hearing loss, unspecified ear: Secondary | ICD-10-CM | POA: Diagnosis not present

## 2019-02-22 DIAGNOSIS — D61818 Other pancytopenia: Secondary | ICD-10-CM | POA: Diagnosis not present

## 2019-02-22 LAB — CBC WITH DIFFERENTIAL (CANCER CENTER ONLY)
Abs Immature Granulocytes: 0.01 10*3/uL (ref 0.00–0.07)
Basophils Absolute: 0 10*3/uL (ref 0.0–0.1)
Basophils Relative: 0 %
Eosinophils Absolute: 0 10*3/uL (ref 0.0–0.5)
Eosinophils Relative: 0 %
HCT: 22.9 % — ABNORMAL LOW (ref 36.0–46.0)
Hemoglobin: 7.7 g/dL — ABNORMAL LOW (ref 12.0–15.0)
Immature Granulocytes: 2 %
Lymphocytes Relative: 62 %
Lymphs Abs: 0.3 10*3/uL — ABNORMAL LOW (ref 0.7–4.0)
MCH: 29.6 pg (ref 26.0–34.0)
MCHC: 33.6 g/dL (ref 30.0–36.0)
MCV: 88.1 fL (ref 80.0–100.0)
Monocytes Absolute: 0.1 10*3/uL (ref 0.1–1.0)
Monocytes Relative: 9 %
Neutro Abs: 0.2 10*3/uL — CL (ref 1.7–7.7)
Neutrophils Relative %: 27 %
Platelet Count: 8 10*3/uL — CL (ref 150–400)
RBC: 2.6 MIL/uL — ABNORMAL LOW (ref 3.87–5.11)
RDW: 13.7 % (ref 11.5–15.5)
WBC Count: 0.6 10*3/uL — CL (ref 4.0–10.5)
nRBC: 0 % (ref 0.0–0.2)

## 2019-02-22 LAB — SAMPLE TO BLOOD BANK

## 2019-02-22 MED ORDER — DIPHENHYDRAMINE HCL 50 MG/ML IJ SOLN: 25.0000 mg | Freq: Once | INTRAMUSCULAR | Status: DC

## 2019-02-22 MED ORDER — DIPHENHYDRAMINE HCL 50 MG/ML IJ SOLN
INTRAMUSCULAR | Status: AC
  Filled 2019-02-22: qty 1

## 2019-02-22 MED ORDER — SODIUM CHLORIDE 0.9% FLUSH
10.0000 mL | INTRAVENOUS | Status: DC | PRN
  Administered 2019-02-22: 15:00:00 10 mL
  Filled 2019-02-22: qty 10

## 2019-02-22 MED ORDER — HEPARIN SOD (PORK) LOCK FLUSH 100 UNIT/ML IV SOLN
500.0000 [IU] | Freq: Every day | INTRAVENOUS | Status: AC | PRN
  Administered 2019-02-22: 500 [IU]
  Filled 2019-02-22: qty 5

## 2019-02-22 MED ORDER — DIPHENHYDRAMINE HCL 25 MG PO CAPS
ORAL_CAPSULE | ORAL | Status: AC
  Filled 2019-02-22: qty 1

## 2019-02-22 MED ORDER — SODIUM CHLORIDE 0.9% FLUSH
10.0000 mL | INTRAVENOUS | Status: AC | PRN
  Administered 2019-02-22: 10 mL
  Filled 2019-02-22: qty 10

## 2019-02-22 MED ORDER — SODIUM CHLORIDE 0.9% IV SOLUTION
250.0000 mL | Freq: Once | INTRAVENOUS | Status: AC
  Administered 2019-02-22: 250 mL via INTRAVENOUS
  Filled 2019-02-22: qty 250

## 2019-02-22 MED ORDER — DIPHENHYDRAMINE HCL 25 MG PO CAPS
25.0000 mg | ORAL_CAPSULE | Freq: Once | ORAL | Status: AC
  Administered 2019-02-22: 25 mg via ORAL

## 2019-02-22 NOTE — Patient Instructions (Signed)
Platelet Transfusion A platelet transfusion is a procedure in which you receive donated platelets through an IV. Platelets are tiny pieces of blood cells. When you get an injury, platelets clump together in the area to form a blood clot. This helps stop bleeding and is the beginning of the healing process. If you have too few platelets, your blood may have trouble clotting. This may cause you to bleed and bruise very easily. You may need a platelet transfusion if you have a condition that causes a low number of platelets (thrombocytopenia). A platelet transfusion may be used to stop or prevent excessive bleeding. Tell a health care provider about:  Any reactions you have had during previous transfusions.  Any allergies you have.  All medicines you are taking, including vitamins, herbs, eye drops, creams, and over-the-counter medicines.  Any blood disorders you have.  Any surgeries you have had.  Any medical conditions you have.  Whether you are pregnant or may be pregnant. What are the risks? Generally, this is a safe procedure. However, problems may occur, including:  Fever.  Infection.  Allergic reaction to the donor platelets.  Your body's disease-fighting system (immune system) attacking the donor platelets (hemolytic reaction). This is rare.  A rare reaction that causes lung damage (transfusion-related acute lung injury). What happens before the procedure? Medicines  Ask your health care provider about: ? Changing or stopping your regular medicines. This is especially important if you are taking diabetes medicines or blood thinners. ? Taking medicines such as aspirin and ibuprofen. These medicines can thin your blood. Do not take these medicines unless your health care provider tells you to take them. ? Taking over-the-counter medicines, vitamins, herbs, and supplements. General instructions  You will have a blood test to determine your blood type. Your blood type  determines what kind of platelets you will be given.  Follow instructions from your health care provider about eating or drinking restrictions.  If you have had an allergic reaction to a transfusion in the past, you may be given medicine to help prevent a reaction.  Your temperature, blood pressure, pulse, and breathing will be monitored. What happens during the procedure?   An IV will be inserted into one of your veins.  For your safety, two health care providers will verify your identity along with the donor platelets about to be infused.  A bag of donor platelets will be connected to your IV. The platelets will flow into your bloodstream. This usually takes 30-60 minutes.  Your temperature, blood pressure, pulse, and breathing will be monitored during the transfusion. This helps detect early signs of any reaction.  You will also be monitored for other symptoms that may indicate a reaction, including chills, hives, or itching.  If you have signs of a reaction at any time, your transfusion will be stopped, and you may be given medicine to help manage the reaction.  When your transfusion is complete, your IV will be removed.  Pressure may be applied to the IV site for a few minutes to stop any bleeding.  The IV site will be covered with a bandage (dressing). The procedure may vary among health care providers and hospitals. What happens after the procedure?  Your blood pressure, temperature, pulse, and breathing will be monitored until you leave the hospital or clinic.  You may have some bruising and soreness at your IV site. Follow these instructions at home: Medicines  Take over-the-counter and prescription medicines only as told by your health care provider.  Talk with your health care provider before you take any medicines that contain aspirin or NSAIDs. These medicines increase your risk for dangerous bleeding. General instructions  Change or remove your dressing as told  by your health care provider.  Return to your normal activities as told by your health care provider. Ask your health care provider what activities are safe for you.  Do not take baths, swim, or use a hot tub until your health care provider approves. Ask your health care provider if you may take showers.  Check your IV site every day for signs of infection. Check for: ? Redness, swelling, or pain. ? Fluid or blood. If fluid or blood drains from your IV site, use your hands to press down firmly on a bandage covering the area for a minute or two. Doing this should stop the bleeding. ? Warmth. ? Pus or a bad smell.  Keep all follow-up visits as told by your health care provider. This is important. Contact a health care provider if you have:  A headache that does not go away with medicine.  Hives, rash, or itchy skin.  Nausea or vomiting.  Unusual tiredness or weakness.  Signs of infection at your IV site. Get help right away if:  You have a fever or chills.  You urinate less often than usual.  Your urine is darker colored than normal.  You have any of the following: ? Trouble breathing. ? Pain in your back, abdomen, or chest. ? Cool, clammy skin. ? A fast heartbeat. Summary  Platelets are tiny pieces of blood cells that clump together to form a blood clot when you have an injury. If you have too few platelets, your blood may have trouble clotting.  A platelet transfusion is a procedure in which you receive donated platelets through an IV.  A platelet transfusion may be used to stop or prevent excessive bleeding.  After the procedure, check your IV site every day for signs of infection, including redness, swelling, pain, or warmth. This information is not intended to replace advice given to you by your health care provider. Make sure you discuss any questions you have with your health care provider. Document Released: 05/12/2007 Document Revised: 08/20/2017 Document  Reviewed: 08/20/2017 Elsevier Patient Education  2020 [place] (COVID-19) Are you at risk?  Are you at risk for the Coronavirus (COVID-19)?  To be considered HIGH RISK for Coronavirus (COVID-19), you have to meet the following criteria:  . Traveled to [place], [place], [place], [place] or [place]; or in the [place] to [place], [place], [place], or [place]; and have fever, cough, and shortness of breath within the last 2 weeks of travel OR . Been in close contact with a person diagnosed with COVID-19 within the last 2 weeks and have fever, cough, and shortness of breath . IF YOU DO NOT MEET THESE CRITERIA, YOU ARE CONSIDERED LOW RISK FOR COVID-19.  What to do if you are HIGH RISK for COVID-19?  Marland Kitchen If you are having a medical emergency, call 911. . Seek medical care right away. Before you go to a doctor's office, urgent care or emergency department, call ahead and tell them about your recent travel, contact with someone diagnosed with COVID-19, and your symptoms. You should receive instructions from your physician's office regarding next steps of care.  . When you arrive at healthcare provider, tell the healthcare staff immediately you have returned from visiting [place], [place], [place], [place] or [place]; or traveled in the [place] to [place], [place], [place], or [place]; in the last two weeks or you have been in close contact with a person diagnosed with COVID-19 in the last 2 weeks.   . Tell the health care staff about your symptoms: fever, cough and shortness of breath. . After you have been seen by a medical provider, you will be either: o Tested for (COVID-19) and discharged home on quarantine except to seek medical care if symptoms worsen, and asked to  - Stay home and avoid contact with others until you get your results (4-5 days)  - Avoid travel on public transportation if possible (such as bus, train, or airplane) or o Sent to the  Emergency Department by EMS for evaluation, COVID-19 testing, and possible admission depending on your condition and test results.  What to do if you are LOW RISK for COVID-19?  Reduce your risk of any infection by using the same precautions used for avoiding the common cold or flu:  Marland Kitchen Wash your hands often with soap and warm water for at least 20 seconds.  If soap and water are not readily available, use an alcohol-based hand sanitizer with at least 60% alcohol.  . If coughing or sneezing, cover your mouth and nose by coughing or sneezing into the elbow areas of your shirt or coat, into a tissue or into your sleeve (not your hands). . Avoid shaking hands with others and consider head nods or verbal greetings only. . Avoid touching your eyes, nose, or mouth with unwashed hands.  . Avoid close contact with people who are sick. . Avoid places or events with large numbers of people in one location, like concerts or sporting events. . Carefully consider travel plans you have or are making. . If you are planning any travel outside or inside the [place], visit the CDC's Travelers' Health webpage for the latest health notices. . If you have some symptoms but not all symptoms, continue to monitor at home and seek medical attention if your symptoms worsen. . If you are having a medical emergency, call 911.   [place] / e-Visit: [email or web address]         [facility]: [facility]: [phone]                   [facility]: [phone]

## 2019-02-22 NOTE — Telephone Encounter (Signed)
Spoke with patient's son Heather Olson about his mother's lab results today.  Explained she will be getting platelets today, not enough time to give blood today.  I offered him a 7:30 am appointment tomorrow 7/28 but he states he cannot bring her because he has to work.  I asked what time he could bring her he said 2:00 pm.  This was okayed by [employer] in infusion and scheduled.    Orders were placed.

## 2019-02-23 ENCOUNTER — Other Ambulatory Visit: Payer: Self-pay

## 2019-02-23 ENCOUNTER — Inpatient Hospital Stay: Payer: Medicare Other

## 2019-02-23 DIAGNOSIS — D61818 Other pancytopenia: Secondary | ICD-10-CM | POA: Diagnosis not present

## 2019-02-23 DIAGNOSIS — M3320 Polymyositis, organ involvement unspecified: Secondary | ICD-10-CM | POA: Diagnosis not present

## 2019-02-23 DIAGNOSIS — D46Z Other myelodysplastic syndromes: Secondary | ICD-10-CM

## 2019-02-23 DIAGNOSIS — H9190 Unspecified hearing loss, unspecified ear: Secondary | ICD-10-CM | POA: Diagnosis not present

## 2019-02-23 DIAGNOSIS — D6959 Other secondary thrombocytopenia: Secondary | ICD-10-CM | POA: Diagnosis not present

## 2019-02-23 DIAGNOSIS — D469 Myelodysplastic syndrome, unspecified: Secondary | ICD-10-CM | POA: Diagnosis not present

## 2019-02-23 DIAGNOSIS — I10 Essential (primary) hypertension: Secondary | ICD-10-CM | POA: Diagnosis not present

## 2019-02-23 LAB — BPAM PLATELET PHERESIS
Blood Product Expiration Date: 202007292359
ISSUE DATE / TIME: 202007271615
Unit Type and Rh: 6200

## 2019-02-23 LAB — PREPARE PLATELET PHERESIS: Unit division: 0

## 2019-02-23 MED ORDER — SODIUM CHLORIDE 0.9% IV SOLUTION
250.0000 mL | Freq: Once | INTRAVENOUS | Status: AC
  Administered 2019-02-23: 250 mL via INTRAVENOUS
  Filled 2019-02-23: qty 250

## 2019-02-23 MED ORDER — DIPHENHYDRAMINE HCL 25 MG PO CAPS
25.0000 mg | ORAL_CAPSULE | Freq: Once | ORAL | Status: AC
  Administered 2019-02-23: 25 mg via ORAL

## 2019-02-23 MED ORDER — HEPARIN SOD (PORK) LOCK FLUSH 100 UNIT/ML IV SOLN
250.0000 [IU] | INTRAVENOUS | Status: AC | PRN
  Administered 2019-02-23: 500 [IU]
  Filled 2019-02-23: qty 5

## 2019-02-23 MED ORDER — SODIUM CHLORIDE 0.9% FLUSH
10.0000 mL | INTRAVENOUS | Status: AC | PRN
  Administered 2019-02-23: 17:00:00 10 mL
  Filled 2019-02-23: qty 10

## 2019-02-23 NOTE — Patient Instructions (Signed)
Blood Transfusion, Adult, Care After This sheet gives you information about how to care for yourself after your procedure. Your doctor may also give you more specific instructions. If you have problems or questions, contact your doctor. Follow these instructions at home:   Take over-the-counter and prescription medicines only as told by your doctor.  Go back to your normal activities as told by your doctor.  Follow instructions from your doctor about how to take care of the area where an IV tube was put into your vein (insertion site). Make sure you: ? Wash your hands with soap and water before you change your bandage (dressing). If there is no soap and water, use hand sanitizer. ? Change your bandage as told by your doctor.  Check your IV insertion site every day for signs of infection. Check for: ? More redness, swelling, or pain. ? More fluid or blood. ? Warmth. ? Pus or a bad smell. Contact a doctor if:  You have more redness, swelling, or pain around the IV insertion site.  You have more fluid or blood coming from the IV insertion site.  Your IV insertion site feels warm to the touch.  You have pus or a bad smell coming from the IV insertion site.  Your pee (urine) turns pink, red, or brown.  You feel weak after doing your normal activities. Get help right away if:  You have signs of a serious allergic or body defense (immune) system reaction, including: ? Itchiness. ? Hives. ? Trouble breathing. ? Anxiety. ? Pain in your chest or lower back. ? Fever, flushing, and chills. ? Fast pulse. ? Rash. ? Watery poop (diarrhea). ? Throwing up (vomiting). ? Dark pee. ? Serious headache. ? Dizziness. ? Stiff neck. ? Yellow color in your face or the white parts of your eyes (jaundice). Summary  After a blood transfusion, return to your normal activities as told by your doctor.  Every day, check for signs of infection where the IV tube was put into your vein.  Some  signs of infection are warm skin, more redness and pain, more fluid or blood, and pus or a bad smell where the needle went in.  Contact your doctor if you feel weak or have any unusual symptoms. This information is not intended to replace advice given to you by your health care provider. Make sure you discuss any questions you have with your health care provider. Document Released: 08/05/2014 Document Revised: 11/19/2017 Document Reviewed: 03/08/2016 Elsevier Patient Education  2020 Elsevier Inc.  

## 2019-02-26 ENCOUNTER — Other Ambulatory Visit: Payer: Medicare Other

## 2019-02-26 ENCOUNTER — Other Ambulatory Visit: Payer: Self-pay

## 2019-02-26 ENCOUNTER — Inpatient Hospital Stay: Payer: Medicare Other

## 2019-02-26 ENCOUNTER — Telehealth: Payer: Self-pay

## 2019-02-26 DIAGNOSIS — D46Z Other myelodysplastic syndromes: Secondary | ICD-10-CM

## 2019-02-26 DIAGNOSIS — H9190 Unspecified hearing loss, unspecified ear: Secondary | ICD-10-CM | POA: Diagnosis not present

## 2019-02-26 DIAGNOSIS — D469 Myelodysplastic syndrome, unspecified: Secondary | ICD-10-CM | POA: Diagnosis not present

## 2019-02-26 DIAGNOSIS — I10 Essential (primary) hypertension: Secondary | ICD-10-CM | POA: Diagnosis not present

## 2019-02-26 DIAGNOSIS — D6959 Other secondary thrombocytopenia: Secondary | ICD-10-CM | POA: Diagnosis not present

## 2019-02-26 DIAGNOSIS — M3320 Polymyositis, organ involvement unspecified: Secondary | ICD-10-CM | POA: Diagnosis not present

## 2019-02-26 DIAGNOSIS — D61818 Other pancytopenia: Secondary | ICD-10-CM | POA: Diagnosis not present

## 2019-02-26 DIAGNOSIS — Z95828 Presence of other vascular implants and grafts: Secondary | ICD-10-CM

## 2019-02-26 LAB — CBC WITH DIFFERENTIAL (CANCER CENTER ONLY)
Abs Immature Granulocytes: 0 10*3/uL (ref 0.00–0.07)
Basophils Absolute: 0 10*3/uL (ref 0.0–0.1)
Basophils Relative: 0 %
Eosinophils Absolute: 0 10*3/uL (ref 0.0–0.5)
Eosinophils Relative: 0 %
HCT: 24.9 % — ABNORMAL LOW (ref 36.0–46.0)
Hemoglobin: 8.4 g/dL — ABNORMAL LOW (ref 12.0–15.0)
Immature Granulocytes: 0 %
Lymphocytes Relative: 72 %
Lymphs Abs: 0.5 10*3/uL — ABNORMAL LOW (ref 0.7–4.0)
MCH: 29.7 pg (ref 26.0–34.0)
MCHC: 33.7 g/dL (ref 30.0–36.0)
MCV: 88 fL (ref 80.0–100.0)
Monocytes Absolute: 0 10*3/uL — ABNORMAL LOW (ref 0.1–1.0)
Monocytes Relative: 3 %
Neutro Abs: 0.2 10*3/uL — CL (ref 1.7–7.7)
Neutrophils Relative %: 25 %
Platelet Count: 13 10*3/uL — ABNORMAL LOW (ref 150–400)
RBC: 2.83 MIL/uL — ABNORMAL LOW (ref 3.87–5.11)
RDW: 13.9 % (ref 11.5–15.5)
WBC Count: 0.6 10*3/uL — CL (ref 4.0–10.5)
nRBC: 0 % (ref 0.0–0.2)

## 2019-02-26 LAB — SAMPLE TO BLOOD BANK

## 2019-02-26 LAB — PREPARE RBC (CROSSMATCH)

## 2019-02-26 LAB — TYPE AND SCREEN
ABO/RH(D): A POS
Antibody Screen: NEGATIVE
Unit division: 0
Unit division: 0

## 2019-02-26 LAB — BPAM RBC
Blood Product Expiration Date: 202008192359
Blood Product Expiration Date: 202008202359
ISSUE DATE / TIME: 202007281449
Unit Type and Rh: 6200
Unit Type and Rh: 6200

## 2019-02-26 MED ORDER — HEPARIN SOD (PORK) LOCK FLUSH 100 UNIT/ML IV SOLN
250.0000 [IU] | INTRAVENOUS | Status: AC | PRN
  Administered 2019-02-26: 500 [IU]
  Filled 2019-02-26: qty 5

## 2019-02-26 MED ORDER — SODIUM CHLORIDE 0.9% FLUSH
3.0000 mL | INTRAVENOUS | Status: DC | PRN
  Filled 2019-02-26: qty 10

## 2019-02-26 MED ORDER — DIPHENHYDRAMINE HCL 25 MG PO CAPS
25.0000 mg | ORAL_CAPSULE | Freq: Once | ORAL | Status: AC
  Administered 2019-02-26: 25 mg via ORAL

## 2019-02-26 MED ORDER — SODIUM CHLORIDE 0.9% IV SOLUTION
250.0000 mL | Freq: Once | INTRAVENOUS | Status: AC
  Administered 2019-02-26: 250 mL via INTRAVENOUS
  Filled 2019-02-26: qty 250

## 2019-02-26 MED ORDER — DIPHENHYDRAMINE HCL 25 MG PO CAPS
ORAL_CAPSULE | ORAL | Status: AC
  Filled 2019-02-26: qty 1

## 2019-02-26 MED ORDER — SODIUM CHLORIDE 0.9% FLUSH
10.0000 mL | INTRAVENOUS | Status: AC | PRN
  Administered 2019-02-26: 10 mL
  Filled 2019-02-26: qty 10

## 2019-02-26 MED ORDER — SODIUM CHLORIDE 0.9% FLUSH
10.0000 mL | INTRAVENOUS | Status: DC | PRN
  Administered 2019-02-26: 08:00:00 10 mL
  Filled 2019-02-26: qty 10

## 2019-02-26 NOTE — Telephone Encounter (Signed)
CRITICAL VALUE STICKER  CRITICAL VALUE:  WBC 0.6, platelets 13, ANC 0.2   RECEIVER (on-site recipient of call):Berton Butrick RN  DATE & TIME NOTIFIED: 02/26/2019 0820  MESSENGER (representative from lab):Jen  MD NOTIFIED: Dr. Burr Medico  TIME OF NOTIFICATION: 02/26/2019 0820  RESPONSE: Orders for one unit of platelets and one unit of blood received, orders placed.

## 2019-02-26 NOTE — Patient Instructions (Signed)
Blood Transfusion, Adult, Care After This sheet gives you information about how to care for yourself after your procedure. Your doctor may also give you more specific instructions. If you have problems or questions, contact your doctor. Follow these instructions at home:   Take over-the-counter and prescription medicines only as told by your doctor.  Go back to your normal activities as told by your doctor.  Follow instructions from your doctor about how to take care of the area where an IV tube was put into your vein (insertion site). Make sure you: ? Wash your hands with soap and water before you change your bandage (dressing). If there is no soap and water, use hand sanitizer. ? Change your bandage as told by your doctor.  Check your IV insertion site every day for signs of infection. Check for: ? More redness, swelling, or pain. ? More fluid or blood. ? Warmth. ? Pus or a bad smell. Contact a doctor if:  You have more redness, swelling, or pain around the IV insertion site.  You have more fluid or blood coming from the IV insertion site.  Your IV insertion site feels warm to the touch.  You have pus or a bad smell coming from the IV insertion site.  Your pee (urine) turns pink, red, or brown.  You feel weak after doing your normal activities. Get help right away if:  You have signs of a serious allergic or body defense (immune) system reaction, including: ? Itchiness. ? Hives. ? Trouble breathing. ? Anxiety. ? Pain in your chest or lower back. ? Fever, flushing, and chills. ? Fast pulse. ? Rash. ? Watery poop (diarrhea). ? Throwing up (vomiting). ? Dark pee. ? Serious headache. ? Dizziness. ? Stiff neck. ? Yellow color in your face or the white parts of your eyes (jaundice). Summary  After a blood transfusion, return to your normal activities as told by your doctor.  Every day, check for signs of infection where the IV tube was put into your vein.  Some  signs of infection are warm skin, more redness and pain, more fluid or blood, and pus or a bad smell where the needle went in.  Contact your doctor if you feel weak or have any unusual symptoms. This information is not intended to replace advice given to you by your health care provider. Make sure you discuss any questions you have with your health care provider. Document Released: 08/05/2014 Document Revised: 11/19/2017 Document Reviewed: 03/08/2016 Elsevier Patient Education  2020 Elsevier Inc.  

## 2019-02-28 LAB — TYPE AND SCREEN
ABO/RH(D): A POS
Antibody Screen: NEGATIVE
Unit division: 0

## 2019-02-28 LAB — BPAM PLATELET PHERESIS
Blood Product Expiration Date: 202008022359
ISSUE DATE / TIME: 202007310906
Unit Type and Rh: 6200

## 2019-02-28 LAB — PREPARE PLATELET PHERESIS: Unit division: 0

## 2019-02-28 LAB — BPAM RBC
Blood Product Expiration Date: 202008202359
ISSUE DATE / TIME: 202007311023
Unit Type and Rh: 6200

## 2019-03-01 ENCOUNTER — Other Ambulatory Visit: Payer: Self-pay

## 2019-03-02 ENCOUNTER — Inpatient Hospital Stay: Payer: Medicare Other | Attending: Hematology

## 2019-03-02 ENCOUNTER — Inpatient Hospital Stay: Payer: Medicare Other

## 2019-03-02 ENCOUNTER — Other Ambulatory Visit: Payer: Self-pay

## 2019-03-02 ENCOUNTER — Telehealth: Payer: Self-pay | Admitting: *Deleted

## 2019-03-02 VITALS — BP 134/62 | HR 86 | Temp 98.3°F | Resp 16

## 2019-03-02 DIAGNOSIS — H9190 Unspecified hearing loss, unspecified ear: Secondary | ICD-10-CM | POA: Diagnosis not present

## 2019-03-02 DIAGNOSIS — K219 Gastro-esophageal reflux disease without esophagitis: Secondary | ICD-10-CM | POA: Insufficient documentation

## 2019-03-02 DIAGNOSIS — D46Z Other myelodysplastic syndromes: Secondary | ICD-10-CM

## 2019-03-02 DIAGNOSIS — Z79899 Other long term (current) drug therapy: Secondary | ICD-10-CM | POA: Insufficient documentation

## 2019-03-02 DIAGNOSIS — D61810 Antineoplastic chemotherapy induced pancytopenia: Secondary | ICD-10-CM | POA: Diagnosis not present

## 2019-03-02 DIAGNOSIS — K5900 Constipation, unspecified: Secondary | ICD-10-CM | POA: Diagnosis not present

## 2019-03-02 DIAGNOSIS — I10 Essential (primary) hypertension: Secondary | ICD-10-CM | POA: Diagnosis not present

## 2019-03-02 DIAGNOSIS — Z95828 Presence of other vascular implants and grafts: Secondary | ICD-10-CM

## 2019-03-02 DIAGNOSIS — M3320 Polymyositis, organ involvement unspecified: Secondary | ICD-10-CM | POA: Insufficient documentation

## 2019-03-02 DIAGNOSIS — D469 Myelodysplastic syndrome, unspecified: Secondary | ICD-10-CM | POA: Insufficient documentation

## 2019-03-02 LAB — CBC WITH DIFFERENTIAL (CANCER CENTER ONLY)
Abs Immature Granulocytes: 0 10*3/uL (ref 0.00–0.07)
Basophils Absolute: 0 10*3/uL (ref 0.0–0.1)
Basophils Relative: 0 %
Eosinophils Absolute: 0 10*3/uL (ref 0.0–0.5)
Eosinophils Relative: 0 %
HCT: 28.2 % — ABNORMAL LOW (ref 36.0–46.0)
Hemoglobin: 9.5 g/dL — ABNORMAL LOW (ref 12.0–15.0)
Immature Granulocytes: 0 %
Lymphocytes Relative: 75 %
Lymphs Abs: 0.4 10*3/uL — ABNORMAL LOW (ref 0.7–4.0)
MCH: 30.3 pg (ref 26.0–34.0)
MCHC: 33.7 g/dL (ref 30.0–36.0)
MCV: 89.8 fL (ref 80.0–100.0)
Monocytes Absolute: 0 10*3/uL — ABNORMAL LOW (ref 0.1–1.0)
Monocytes Relative: 5 %
Neutro Abs: 0.1 10*3/uL — CL (ref 1.7–7.7)
Neutrophils Relative %: 20 %
Platelet Count: 35 10*3/uL — ABNORMAL LOW (ref 150–400)
RBC: 3.14 MIL/uL — ABNORMAL LOW (ref 3.87–5.11)
RDW: 13.7 % (ref 11.5–15.5)
WBC Count: 0.6 10*3/uL — CL (ref 4.0–10.5)
nRBC: 0 % (ref 0.0–0.2)

## 2019-03-02 LAB — SAMPLE TO BLOOD BANK

## 2019-03-02 MED ORDER — SODIUM CHLORIDE 0.9% FLUSH
10.0000 mL | INTRAVENOUS | Status: DC | PRN
  Administered 2019-03-02: 14:00:00 10 mL
  Filled 2019-03-02: qty 10

## 2019-03-02 MED ORDER — SODIUM CHLORIDE 0.9% FLUSH
10.0000 mL | Freq: Once | INTRAVENOUS | Status: AC
  Administered 2019-03-02: 10 mL via INTRAVENOUS
  Filled 2019-03-02: qty 10

## 2019-03-02 MED ORDER — HEPARIN SOD (PORK) LOCK FLUSH 100 UNIT/ML IV SOLN
500.0000 [IU] | Freq: Once | INTRAVENOUS | Status: AC
  Administered 2019-03-02: 500 [IU] via INTRAVENOUS
  Filled 2019-03-02: qty 5

## 2019-03-02 NOTE — Patient Instructions (Signed)
Neutropenia Neutropenia is a condition that occurs when you have a lower-than-normal level of a type of white blood cell (neutrophil) in your body. Neutrophils are made in the spongy center of large bones (bone marrow), and they fight infections. Neutrophils are your body's main defense against bacterial and fungal infections. The fewer neutrophils you have and the longer your body remains without them, the greater your risk of getting a severe infection. What are the causes? This condition can occur if your body uses up or destroys neutrophils faster than your bone marrow can make them. Neutropenia may be caused by:  A bacterial or fungal infection.  Allergic disorders.  Reactions to some medicines.  An autoimmune disease.  An enlarged spleen. This condition can also occur if your bone marrow does not produce enough neutrophils. This problem may be caused by:  Cancer.  Cancer treatments, such as radiation or chemotherapy.  Viral infections.  Medicines, such as phenytoin.  Vitamin B12 deficiency.  Diseases of the bone marrow.  Environmental toxins, such as insecticides. What are the signs or symptoms? This condition does not usually cause symptoms. If symptoms are present, they are usually caused by an underlying infection. Symptoms of an infection may include:  Fever.  Chills.  Swollen glands.  Oral or anal ulcers.  Cough and shortness of breath.  Rash.  Skin infection.  Fatigue. How is this diagnosed? Your health care provider may suspect neutropenia if you have:  A condition that may cause neutropenia.  Symptoms during or after treatment for cancer.  Symptoms of infection, especially fever.  Frequent and unusual infections. This condition is diagnosed based on your medical history and a physical exam. Tests will also be done, such as:  A complete blood count (CBC).  A procedure to collect a sample of bone marrow for examination (bone marrow biopsy).   A chest X-ray.  A urine culture.  A blood culture. How is this treated? Treatment depends on the underlying cause and severity of your condition. Mild neutropenia may not require treatment. Treatment may include medicines, such as:  Antibiotic medicine given through an IV.  Antiviral medicines.  Antifungal medicines.  A medicine to increase neutrophil production (colony-stimulating factor). You may get this drug through an IV or by injection.  Steroids given through an IV. If an underlying condition is causing neutropenia, you may need treatment for that condition. If medicines or cancer treatments are causing neutropenia, your health care provider may have you stop the medicines or treatment. Follow these instructions at home: Medicines   Take over-the-counter and prescription medicines only as told by your health care provider.  Get a seasonal flu shot (influenza vaccine).  Avoid people who received a vaccine in the past 30 days if that vaccine contained a live version of the germ (live vaccine). You should not get a live vaccine. Common live vaccines are polio, MMR, chicken pox, and shingles vaccines. Eating and drinking  Do not share food utensils.  Do not eat unpasteurized foods.  Do not eat raw or undercooked meat, eggs, or seafood.  Do not eat unwashed, raw fruits or vegetables. Lifestyle  Avoid exposure to groups of people or children.  Avoid being around people who are sick.  Avoid being around dirt or dust, such as in construction areas or gardens.  Do not provide direct care for pets. Avoid animal droppings. Do not clean litter boxes and bird cages.  Do not have sex unless your health care provider has approved. Hygiene     Bathe daily.  Clean the area between the genitals and the anus (perineal area) after you urinate or have a bowel movement. If you are female, wipe from front to back.  Brush your teeth with a soft toothbrush before and after meals.   Do not use a regular razor. Use an electric razor to remove hair.  Wash your hands often. Make sure others who come in contact with you also wash their hands. If soap and water are not available, use hand sanitizer. General instructions  Follow any precautions as told by your health care provider to reduce your risk for injury or infection.  Take actions to avoid cuts and burns. For example: ? Be cautious when you use knives. Always cut away from yourself. ? Keep knives in protective sheaths or guards when not in use. ? Use oven mitts when you cook with a hot stove, oven, or grill. ? Stand a safe distance away from open fires.  Do not use tampons, enemas, or rectal suppositories unless your health care provider has approved.  Keep all follow-up visits as told by your health care provider. This is important. Contact a health care provider if:  You have: ? A sore throat. ? A warm, red, or tender area on your skin. ? A cough. ? Frequent or painful urination. ? Vaginal discharge or itching.  You develop: ? Sores in your mouth or anus. ? Swollen lymph nodes. ? Red streaks on the skin. ? A rash. Get help right away if:  You have: ? A fever. ? Chills, or you start to shake.  You feel: ? Nauseous, or you vomit. ? Very fatigued. ? Short of breath. Summary  Neutropenia is a condition that occurs when you have a lower-than-normal level of a type of white blood cell (neutrophil) in your body.  This condition can occur if your body uses up or destroys neutrophils faster than your bone marrow can make them.  Treatment depends on the underlying cause and severity of your condition. Mild neutropenia may not require treatment.  Follow any precautions as told by your health care provider to reduce your risk for injury or infection. This information is not intended to replace advice given to you by your health care provider. Make sure you discuss any questions you have with your health  care provider. Document Released: 01/04/2002 Document Revised: 04/30/2018 Document Reviewed: 04/30/2018 Elsevier Patient Education  [place].    Thrombocytopenia Thrombocytopenia means that you have a low number of platelets in your blood. Platelets are tiny cells in the blood. When you bleed, they clump together at the cut or injury to stop the bleeding. This is called blood clotting. If you do not have enough platelets, it can cause bleeding problems. Some cases of this condition are mild while others are more severe. What are the causes? This condition may be caused by:  Your body not making enough platelets. This may be caused by: ? Your bone marrow not making blood cells (aplastic anemia). ? Cancer in the bone marrow. ? Certain medicines. ? Infection in the bone marrow. ? Drinking a lot of alcohol.  Your body destroying platelets too quickly. This may be caused by: ? Certain immune diseases. ? Certain medicines. ? Certain blood clotting disorders. ? Certain disorders that are passed from parent to child (inherited). ? Certain bleeding disorders. ? Pregnancy. ? Having a spleen that is larger than normal. What are the signs or symptoms?  Bleeding that is not normal.  Nosebleeds.  Heavy menstrual periods.  Blood in the pee (urine) or poop (stool).  A purple-like color to the skin (purpura).  Bruising.  A rash that looks like pinpoint, purple-red spots (petechiae). How is this treated?  Treatment of another condition that is causing the low platelet count.  Medicines to help protect your platelets from being destroyed.  A replacement (transfusion) of platelets to stop or prevent bleeding.  Surgery to remove the spleen. Follow these instructions at home: Activity  Avoid activities that could cause you to get hurt or bruised. Follow instructions about how to prevent falls.  Take care not to cut yourself: ? When you shave. ? When you use scissors,  needles, knives, or other tools.  Take care not to burn yourself: ? When you use an iron. ? When you cook. General instructions   Check your skin and the inside of your mouth for bruises or blood as told by your doctor.  Check to see if there is blood in your spit (sputum), pee, and poop. Do this as told by your doctor.  Do not drink alcohol.  Take over-the-counter and prescription medicines only as told by your doctor.  Do not take any medicines that have aspirin or NSAIDs in them. These medicines can thin your blood and cause you to bleed.  Tell all of your doctors that you have this condition. Be sure to tell your dentist and eye doctor too. Contact a doctor if:  You have bruises and you do not know why. Get help right away if:  You are bleeding anywhere on your body.  You have blood in your spit, pee, or poop. Summary  Thrombocytopenia means that you have a low number of platelets in your blood.  Platelets are needed for blood clotting.  Symptoms of this condition include bleeding that is not normal, and bruising.  Take care not to cut or burn yourself. This information is not intended to replace advice given to you by your health care provider. Make sure you discuss any questions you have with your health care provider. Document Released: 07/04/2011 Document Revised: 04/16/2018 Document Reviewed: 04/16/2018 Elsevier Patient Education  2020 [employer].

## 2019-03-02 NOTE — Telephone Encounter (Signed)
CRITICAL VALUE STICKER  CRITICAL VALUE:  WBC 0.6, platelets 13, ANC 0.   RECEIVER (on-site recipient of call):Beth Jeanise Durfey, RN  DATE & TIME NOTIFIED: 03/02/19 1415  MESSENGER (representative from lab):Jen  MD NOTIFIED: Dr. Burr Medico  TIME OF NOTIFICATION: 03/02/19 1415  RESPONSE:

## 2019-03-02 NOTE — Progress Notes (Signed)
Reviewed lab results with Dr. Burr Medico. Per Dr. Burr Medico, pt does not need platelets oe packed RBC's today. Remains neutropenic.  Reviewed neutropenic and thrombocytopenic precautions with her. Teaching instructions given to her to share with her son. [facility] deaccessed and pt home with son.  She is aware of her appts here on Friday, 03/05/19

## 2019-03-02 NOTE — Telephone Encounter (Signed)
Reviewed labs with Dr. Burr Medico.  Neutropenic precautuions reviewed with patient.
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
## 2019-03-04 ENCOUNTER — Telehealth: Payer: Self-pay | Admitting: *Deleted

## 2019-03-04 ENCOUNTER — Other Ambulatory Visit: Payer: Self-pay

## 2019-03-04 DIAGNOSIS — D46Z Other myelodysplastic syndromes: Secondary | ICD-10-CM

## 2019-03-04 NOTE — Telephone Encounter (Signed)
"  Gavin Potters son and caregiver Izell Gallup [phone]).  My mother is having a terrible time trying to use the bathroom; she's extremely constipated.  She has red spots raising up to her skin like red blotches.  I assume some of that is attributed to low platelet count.  Someone give me a call, I really need this addressed."

## 2019-03-04 NOTE — Telephone Encounter (Signed)
"  Is there something that can be called in for Heather Olson bowels?  Her last BM was the day before yesterday.  I've tried warm prune juice and several stool softeners. She is scheduled to be seen tomorrow.  Make a note for Dr. Burr Medico to look at the red spots on her arms and legs.  They are not that large just several."  Hepburn Triage constipation guidelines suggested for M.O.M and Mira lax.  Reports Gwendolyn Lima, dulcolax and senokot on hand.

## 2019-03-05 ENCOUNTER — Other Ambulatory Visit: Payer: Medicare Other

## 2019-03-05 ENCOUNTER — Ambulatory Visit: Payer: Medicare Other

## 2019-03-05 ENCOUNTER — Telehealth: Payer: Self-pay

## 2019-03-05 ENCOUNTER — Inpatient Hospital Stay: Payer: Medicare Other

## 2019-03-05 ENCOUNTER — Inpatient Hospital Stay (HOSPITAL_BASED_OUTPATIENT_CLINIC_OR_DEPARTMENT_OTHER): Payer: Medicare Other | Admitting: Hematology

## 2019-03-05 ENCOUNTER — Other Ambulatory Visit: Payer: Self-pay

## 2019-03-05 ENCOUNTER — Ambulatory Visit: Payer: Medicare Other | Admitting: Hematology

## 2019-03-05 ENCOUNTER — Telehealth: Payer: Self-pay | Admitting: *Deleted

## 2019-03-05 VITALS — BP 140/54 | HR 74 | Temp 98.2°F | Resp 18 | Ht 64.0 in | Wt 185.2 lb

## 2019-03-05 DIAGNOSIS — D46Z Other myelodysplastic syndromes: Secondary | ICD-10-CM | POA: Diagnosis not present

## 2019-03-05 DIAGNOSIS — D61810 Antineoplastic chemotherapy induced pancytopenia: Secondary | ICD-10-CM | POA: Diagnosis not present

## 2019-03-05 DIAGNOSIS — Z95828 Presence of other vascular implants and grafts: Secondary | ICD-10-CM

## 2019-03-05 DIAGNOSIS — K5900 Constipation, unspecified: Secondary | ICD-10-CM | POA: Diagnosis not present

## 2019-03-05 DIAGNOSIS — D469 Myelodysplastic syndrome, unspecified: Secondary | ICD-10-CM | POA: Diagnosis not present

## 2019-03-05 DIAGNOSIS — M3320 Polymyositis, organ involvement unspecified: Secondary | ICD-10-CM | POA: Diagnosis not present

## 2019-03-05 DIAGNOSIS — I10 Essential (primary) hypertension: Secondary | ICD-10-CM | POA: Diagnosis not present

## 2019-03-05 DIAGNOSIS — K219 Gastro-esophageal reflux disease without esophagitis: Secondary | ICD-10-CM | POA: Diagnosis not present

## 2019-03-05 LAB — CMP (CANCER CENTER ONLY)
ALT: 13 U/L (ref 0–44)
AST: 16 U/L (ref 15–41)
Albumin: 3.4 g/dL — ABNORMAL LOW (ref 3.5–5.0)
Alkaline Phosphatase: 122 U/L (ref 38–126)
Anion gap: 9 (ref 5–15)
BUN: 25 mg/dL — ABNORMAL HIGH (ref 8–23)
CO2: 25 mmol/L (ref 22–32)
Calcium: 9 mg/dL (ref 8.9–10.3)
Chloride: 106 mmol/L (ref 98–111)
Creatinine: 1.18 mg/dL — ABNORMAL HIGH (ref 0.44–1.00)
GFR, Est AFR Am: 49 mL/min — ABNORMAL LOW (ref >60)
GFR, Estimated: 43 mL/min — ABNORMAL LOW (ref >60)
Glucose, Bld: 135 mg/dL — ABNORMAL HIGH (ref 70–99)
Potassium: 4.1 mmol/L (ref 3.5–5.1)
Sodium: 140 mmol/L (ref 135–145)
Total Bilirubin: 0.9 mg/dL (ref 0.3–1.2)
Total Protein: 6.3 g/dL — ABNORMAL LOW (ref 6.5–8.1)

## 2019-03-05 LAB — CBC WITH DIFFERENTIAL (CANCER CENTER ONLY)
Abs Immature Granulocytes: 0 10*3/uL (ref 0.00–0.07)
Basophils Absolute: 0 10*3/uL (ref 0.0–0.1)
Basophils Relative: 0 %
Eosinophils Absolute: 0 10*3/uL (ref 0.0–0.5)
Eosinophils Relative: 0 %
HCT: 25.6 % — ABNORMAL LOW (ref 36.0–46.0)
Hemoglobin: 8.6 g/dL — ABNORMAL LOW (ref 12.0–15.0)
Immature Granulocytes: 0 %
Lymphocytes Relative: 68 %
Lymphs Abs: 0.4 10*3/uL — ABNORMAL LOW (ref 0.7–4.0)
MCH: 29.8 pg (ref 26.0–34.0)
MCHC: 33.6 g/dL (ref 30.0–36.0)
MCV: 88.6 fL (ref 80.0–100.0)
Monocytes Absolute: 0 10*3/uL — ABNORMAL LOW (ref 0.1–1.0)
Monocytes Relative: 7 %
Neutro Abs: 0.2 10*3/uL — CL (ref 1.7–7.7)
Neutrophils Relative %: 25 %
Platelet Count: 88 10*3/uL — ABNORMAL LOW (ref 150–400)
RBC: 2.89 MIL/uL — ABNORMAL LOW (ref 3.87–5.11)
RDW: 13.2 % (ref 11.5–15.5)
WBC Count: 0.6 10*3/uL — CL (ref 4.0–10.5)
nRBC: 0 % (ref 0.0–0.2)

## 2019-03-05 LAB — SAMPLE TO BLOOD BANK

## 2019-03-05 MED ORDER — SODIUM CHLORIDE 0.9% FLUSH
10.0000 mL | INTRAVENOUS | Status: DC | PRN
  Administered 2019-03-05: 10 mL
  Filled 2019-03-05: qty 10

## 2019-03-05 MED ORDER — HEPARIN SOD (PORK) LOCK FLUSH 100 UNIT/ML IV SOLN
500.0000 [IU] | Freq: Once | INTRAVENOUS | Status: AC
  Administered 2019-03-05: 11:00:00 500 [IU] via INTRAVENOUS
  Filled 2019-03-05: qty 5

## 2019-03-05 NOTE — Patient Instructions (Signed)

## 2019-03-05 NOTE — Telephone Encounter (Signed)
Faxed today's lab results to Dr. Randa Spike at [facility] for his review, patient has treatment on Monday 8/10.

## 2019-03-05 NOTE — Telephone Encounter (Signed)
Received TC from lab with critical lab values for WBC, ANC.  Results given to Dr. Burr Medico

## 2019-03-06 ENCOUNTER — Encounter: Payer: Self-pay | Admitting: Hematology

## 2019-03-08 DIAGNOSIS — T451X5A Adverse effect of antineoplastic and immunosuppressive drugs, initial encounter: Secondary | ICD-10-CM | POA: Diagnosis present

## 2019-03-08 DIAGNOSIS — Z9841 Cataract extraction status, right eye: Secondary | ICD-10-CM | POA: Diagnosis not present

## 2019-03-08 DIAGNOSIS — Z5111 Encounter for antineoplastic chemotherapy: Secondary | ICD-10-CM | POA: Diagnosis not present

## 2019-03-08 DIAGNOSIS — E1122 Type 2 diabetes mellitus with diabetic chronic kidney disease: Secondary | ICD-10-CM | POA: Diagnosis present

## 2019-03-08 DIAGNOSIS — Z95828 Presence of other vascular implants and grafts: Secondary | ICD-10-CM | POA: Diagnosis not present

## 2019-03-08 DIAGNOSIS — Z7952 Long term (current) use of systemic steroids: Secondary | ICD-10-CM | POA: Diagnosis not present

## 2019-03-08 DIAGNOSIS — Z9049 Acquired absence of other specified parts of digestive tract: Secondary | ICD-10-CM | POA: Diagnosis not present

## 2019-03-08 DIAGNOSIS — D46Z Other myelodysplastic syndromes: Secondary | ICD-10-CM | POA: Diagnosis present

## 2019-03-08 DIAGNOSIS — Z1159 Encounter for screening for other viral diseases: Secondary | ICD-10-CM | POA: Diagnosis not present

## 2019-03-08 DIAGNOSIS — M48062 Spinal stenosis, lumbar region with neurogenic claudication: Secondary | ICD-10-CM | POA: Diagnosis present

## 2019-03-08 DIAGNOSIS — D469 Myelodysplastic syndrome, unspecified: Secondary | ICD-10-CM | POA: Diagnosis not present

## 2019-03-08 DIAGNOSIS — N182 Chronic kidney disease, stage 2 (mild): Secondary | ICD-10-CM | POA: Diagnosis present

## 2019-03-08 DIAGNOSIS — I129 Hypertensive chronic kidney disease with stage 1 through stage 4 chronic kidney disease, or unspecified chronic kidney disease: Secondary | ICD-10-CM | POA: Diagnosis present

## 2019-03-08 DIAGNOSIS — M3320 Polymyositis, organ involvement unspecified: Secondary | ICD-10-CM | POA: Diagnosis present

## 2019-03-08 DIAGNOSIS — H905 Unspecified sensorineural hearing loss: Secondary | ICD-10-CM | POA: Diagnosis present

## 2019-03-08 DIAGNOSIS — M5136 Other intervertebral disc degeneration, lumbar region: Secondary | ICD-10-CM | POA: Diagnosis present

## 2019-03-08 DIAGNOSIS — Z888 Allergy status to other drugs, medicaments and biological substances status: Secondary | ICD-10-CM | POA: Diagnosis not present

## 2019-03-08 DIAGNOSIS — Z9621 Cochlear implant status: Secondary | ICD-10-CM | POA: Diagnosis present

## 2019-03-08 DIAGNOSIS — K5900 Constipation, unspecified: Secondary | ICD-10-CM | POA: Diagnosis present

## 2019-03-08 DIAGNOSIS — D61810 Antineoplastic chemotherapy induced pancytopenia: Secondary | ICD-10-CM | POA: Diagnosis present

## 2019-03-08 DIAGNOSIS — Z9842 Cataract extraction status, left eye: Secondary | ICD-10-CM | POA: Diagnosis not present

## 2019-03-08 DIAGNOSIS — M5481 Occipital neuralgia: Secondary | ICD-10-CM | POA: Diagnosis present

## 2019-03-08 DIAGNOSIS — Z961 Presence of intraocular lens: Secondary | ICD-10-CM | POA: Diagnosis present

## 2019-03-08 DIAGNOSIS — K219 Gastro-esophageal reflux disease without esophagitis: Secondary | ICD-10-CM | POA: Diagnosis present

## 2019-03-08 DIAGNOSIS — Z90710 Acquired absence of both cervix and uterus: Secondary | ICD-10-CM | POA: Diagnosis not present

## 2019-03-08 DIAGNOSIS — Z79899 Other long term (current) drug therapy: Secondary | ICD-10-CM | POA: Diagnosis not present

## 2019-03-10 DIAGNOSIS — Z5111 Encounter for antineoplastic chemotherapy: Secondary | ICD-10-CM | POA: Diagnosis not present

## 2019-03-12 DIAGNOSIS — Z5111 Encounter for antineoplastic chemotherapy: Secondary | ICD-10-CM | POA: Diagnosis not present

## 2019-03-16 ENCOUNTER — Inpatient Hospital Stay: Payer: Medicare Other

## 2019-03-16 ENCOUNTER — Other Ambulatory Visit: Payer: Self-pay

## 2019-03-16 VITALS — BP 140/64 | HR 65 | Temp 97.8°F | Resp 18

## 2019-03-16 DIAGNOSIS — Z95828 Presence of other vascular implants and grafts: Secondary | ICD-10-CM

## 2019-03-16 DIAGNOSIS — K5900 Constipation, unspecified: Secondary | ICD-10-CM | POA: Diagnosis not present

## 2019-03-16 DIAGNOSIS — D61810 Antineoplastic chemotherapy induced pancytopenia: Secondary | ICD-10-CM | POA: Diagnosis not present

## 2019-03-16 DIAGNOSIS — D469 Myelodysplastic syndrome, unspecified: Secondary | ICD-10-CM | POA: Diagnosis not present

## 2019-03-16 DIAGNOSIS — I10 Essential (primary) hypertension: Secondary | ICD-10-CM | POA: Diagnosis not present

## 2019-03-16 DIAGNOSIS — M3320 Polymyositis, organ involvement unspecified: Secondary | ICD-10-CM | POA: Diagnosis not present

## 2019-03-16 DIAGNOSIS — D46Z Other myelodysplastic syndromes: Secondary | ICD-10-CM

## 2019-03-16 DIAGNOSIS — K219 Gastro-esophageal reflux disease without esophagitis: Secondary | ICD-10-CM | POA: Diagnosis not present

## 2019-03-16 LAB — CBC WITH DIFFERENTIAL (CANCER CENTER ONLY)
Abs Immature Granulocytes: 0.01 10*3/uL (ref 0.00–0.07)
Basophils Absolute: 0 10*3/uL (ref 0.0–0.1)
Basophils Relative: 0 %
Eosinophils Absolute: 0 10*3/uL (ref 0.0–0.5)
Eosinophils Relative: 0 %
HCT: 26.9 % — ABNORMAL LOW (ref 36.0–46.0)
Hemoglobin: 9 g/dL — ABNORMAL LOW (ref 12.0–15.0)
Immature Granulocytes: 0 %
Lymphocytes Relative: 12 %
Lymphs Abs: 0.4 10*3/uL — ABNORMAL LOW (ref 0.7–4.0)
MCH: 30.3 pg (ref 26.0–34.0)
MCHC: 33.5 g/dL (ref 30.0–36.0)
MCV: 90.6 fL (ref 80.0–100.0)
Monocytes Absolute: 0.2 10*3/uL (ref 0.1–1.0)
Monocytes Relative: 5 %
Neutro Abs: 2.7 10*3/uL (ref 1.7–7.7)
Neutrophils Relative %: 83 %
Platelet Count: 181 10*3/uL (ref 150–400)
RBC: 2.97 MIL/uL — ABNORMAL LOW (ref 3.87–5.11)
RDW: 13.4 % (ref 11.5–15.5)
WBC Count: 3.3 10*3/uL — ABNORMAL LOW (ref 4.0–10.5)
nRBC: 0 % (ref 0.0–0.2)

## 2019-03-16 LAB — CMP (CANCER CENTER ONLY)
ALT: 18 U/L (ref 0–44)
AST: 19 U/L (ref 15–41)
Albumin: 3.4 g/dL — ABNORMAL LOW (ref 3.5–5.0)
Alkaline Phosphatase: 101 U/L (ref 38–126)
Anion gap: 9 (ref 5–15)
BUN: 29 mg/dL — ABNORMAL HIGH (ref 8–23)
CO2: 23 mmol/L (ref 22–32)
Calcium: 8.6 mg/dL — ABNORMAL LOW (ref 8.9–10.3)
Chloride: 108 mmol/L (ref 98–111)
Creatinine: 1.15 mg/dL — ABNORMAL HIGH (ref 0.44–1.00)
GFR, Est AFR Am: 51 mL/min — ABNORMAL LOW (ref >60)
GFR, Estimated: 44 mL/min — ABNORMAL LOW (ref >60)
Glucose, Bld: 103 mg/dL — ABNORMAL HIGH (ref 70–99)
Potassium: 4.5 mmol/L (ref 3.5–5.1)
Sodium: 140 mmol/L (ref 135–145)
Total Bilirubin: 0.6 mg/dL (ref 0.3–1.2)
Total Protein: 6.4 g/dL — ABNORMAL LOW (ref 6.5–8.1)

## 2019-03-16 LAB — SAMPLE TO BLOOD BANK

## 2019-03-16 MED ORDER — SODIUM CHLORIDE 0.9% FLUSH
10.0000 mL | INTRAVENOUS | Status: DC | PRN
  Administered 2019-03-16: 15:00:00 10 mL
  Filled 2019-03-16: qty 10

## 2019-03-16 MED ORDER — HEPARIN SOD (PORK) LOCK FLUSH 100 UNIT/ML IV SOLN
500.0000 [IU] | Freq: Once | INTRAVENOUS | Status: AC | PRN
  Administered 2019-03-16: 15:00:00 500 [IU]
  Filled 2019-03-16: qty 5

## 2019-03-16 NOTE — Progress Notes (Signed)
Pt's Hbg is 9.0, platelets are 181 - per Dr. Burr Medico pt does not need blood transfusion today.

## 2019-03-19 ENCOUNTER — Inpatient Hospital Stay: Payer: Medicare Other

## 2019-03-19 ENCOUNTER — Other Ambulatory Visit: Payer: Self-pay

## 2019-03-19 VITALS — BP 134/50 | HR 67 | Temp 98.1°F | Resp 20

## 2019-03-19 DIAGNOSIS — K5900 Constipation, unspecified: Secondary | ICD-10-CM | POA: Diagnosis not present

## 2019-03-19 DIAGNOSIS — D469 Myelodysplastic syndrome, unspecified: Secondary | ICD-10-CM | POA: Diagnosis not present

## 2019-03-19 DIAGNOSIS — M3320 Polymyositis, organ involvement unspecified: Secondary | ICD-10-CM | POA: Diagnosis not present

## 2019-03-19 DIAGNOSIS — D61810 Antineoplastic chemotherapy induced pancytopenia: Secondary | ICD-10-CM | POA: Diagnosis not present

## 2019-03-19 DIAGNOSIS — Z95828 Presence of other vascular implants and grafts: Secondary | ICD-10-CM

## 2019-03-19 DIAGNOSIS — K219 Gastro-esophageal reflux disease without esophagitis: Secondary | ICD-10-CM | POA: Diagnosis not present

## 2019-03-19 DIAGNOSIS — I10 Essential (primary) hypertension: Secondary | ICD-10-CM | POA: Diagnosis not present

## 2019-03-19 DIAGNOSIS — D46Z Other myelodysplastic syndromes: Secondary | ICD-10-CM

## 2019-03-19 LAB — CBC WITH DIFFERENTIAL (CANCER CENTER ONLY)
Abs Immature Granulocytes: 0.05 10*3/uL (ref 0.00–0.07)
Basophils Absolute: 0 10*3/uL (ref 0.0–0.1)
Basophils Relative: 1 %
Eosinophils Absolute: 0 10*3/uL (ref 0.0–0.5)
Eosinophils Relative: 0 %
HCT: 25 % — ABNORMAL LOW (ref 36.0–46.0)
Hemoglobin: 8.3 g/dL — ABNORMAL LOW (ref 12.0–15.0)
Immature Granulocytes: 1 %
Lymphocytes Relative: 12 %
Lymphs Abs: 0.5 10*3/uL — ABNORMAL LOW (ref 0.7–4.0)
MCH: 29.7 pg (ref 26.0–34.0)
MCHC: 33.2 g/dL (ref 30.0–36.0)
MCV: 89.6 fL (ref 80.0–100.0)
Monocytes Absolute: 0.2 10*3/uL (ref 0.1–1.0)
Monocytes Relative: 4 %
Neutro Abs: 3.6 10*3/uL (ref 1.7–7.7)
Neutrophils Relative %: 82 %
Platelet Count: 133 10*3/uL — ABNORMAL LOW (ref 150–400)
RBC: 2.79 MIL/uL — ABNORMAL LOW (ref 3.87–5.11)
RDW: 13.3 % (ref 11.5–15.5)
WBC Count: 4.3 10*3/uL (ref 4.0–10.5)
nRBC: 0 % (ref 0.0–0.2)

## 2019-03-19 LAB — PREPARE RBC (CROSSMATCH)

## 2019-03-19 LAB — CMP (CANCER CENTER ONLY)
ALT: 16 U/L (ref 0–44)
AST: 17 U/L (ref 15–41)
Albumin: 3.4 g/dL — ABNORMAL LOW (ref 3.5–5.0)
Alkaline Phosphatase: 94 U/L (ref 38–126)
Anion gap: 8 (ref 5–15)
BUN: 26 mg/dL — ABNORMAL HIGH (ref 8–23)
CO2: 24 mmol/L (ref 22–32)
Calcium: 8.7 mg/dL — ABNORMAL LOW (ref 8.9–10.3)
Chloride: 108 mmol/L (ref 98–111)
Creatinine: 1.18 mg/dL — ABNORMAL HIGH (ref 0.44–1.00)
GFR, Est AFR Am: 49 mL/min — ABNORMAL LOW (ref >60)
GFR, Estimated: 43 mL/min — ABNORMAL LOW (ref >60)
Glucose, Bld: 99 mg/dL (ref 70–99)
Potassium: 4.3 mmol/L (ref 3.5–5.1)
Sodium: 140 mmol/L (ref 135–145)
Total Bilirubin: 0.5 mg/dL (ref 0.3–1.2)
Total Protein: 6.1 g/dL — ABNORMAL LOW (ref 6.5–8.1)

## 2019-03-19 LAB — SAMPLE TO BLOOD BANK

## 2019-03-19 MED ORDER — DIPHENHYDRAMINE HCL 25 MG PO CAPS
ORAL_CAPSULE | ORAL | Status: AC
  Filled 2019-03-19: qty 1

## 2019-03-19 MED ORDER — SODIUM CHLORIDE 0.9 % IV SOLN
Freq: Once | INTRAVENOUS | Status: AC
  Administered 2019-03-19: 11:00:00 via INTRAVENOUS
  Filled 2019-03-19: qty 250

## 2019-03-19 MED ORDER — HEPARIN SOD (PORK) LOCK FLUSH 100 UNIT/ML IV SOLN
500.0000 [IU] | Freq: Once | INTRAVENOUS | Status: AC | PRN
  Administered 2019-03-19: 500 [IU]
  Filled 2019-03-19: qty 5

## 2019-03-19 MED ORDER — SODIUM CHLORIDE 0.9% FLUSH
10.0000 mL | INTRAVENOUS | Status: DC | PRN
  Administered 2019-03-19: 10 mL
  Filled 2019-03-19: qty 10

## 2019-03-19 MED ORDER — SODIUM CHLORIDE 0.9% FLUSH
3.0000 mL | INTRAVENOUS | Status: DC | PRN
  Filled 2019-03-19: qty 10

## 2019-03-19 MED ORDER — DIPHENHYDRAMINE HCL 25 MG PO CAPS
25.0000 mg | ORAL_CAPSULE | Freq: Once | ORAL | Status: AC
  Administered 2019-03-19: 25 mg via ORAL

## 2019-03-19 NOTE — Patient Instructions (Signed)
Blood Transfusion, Adult  A blood transfusion is a procedure in which you receive donated blood, including plasma, platelets, and red blood cells, through an IV tube. You may need a blood transfusion because of illness, surgery, or injury. The blood may come from a donor. You may also be able to donate blood for yourself (autologous blood donation) before a surgery if you know that you might require a blood transfusion. The blood given in a transfusion is made up of different types of cells. You may receive:  Red blood cells. These carry oxygen to the cells in the body.  White blood cells. These help you fight infections.  Platelets. These help your blood to clot.  Plasma. This is the liquid part of your blood and it helps with fluid imbalances. If you have hemophilia or another clotting disorder, you may also receive other types of blood products. Tell a health care provider about:  Any allergies you have.  All medicines you are taking, including vitamins, herbs, eye drops, creams, and over-the-counter medicines.  Any problems you or family members have had with anesthetic medicines.  Any blood disorders you have.  Any surgeries you have had.  Any medical conditions you have, including any recent fever or cold symptoms.  Whether you are pregnant or may be pregnant.  Any previous reactions you have had during a blood transfusion. What are the risks? Generally, this is a safe procedure. However, problems may occur, including:  Having an allergic reaction to something in the donated blood. Hives and itching may be symptoms of this type of reaction.  Fever. This may be a reaction to the white blood cells in the transfused blood. Nausea or chest pain may accompany a fever.  Iron overload. This can happen from having many transfusions.  Transfusion-related acute lung injury (TRALI). This is a rare reaction that causes lung damage. The cause is not known.TRALI can occur within hours  of a transfusion or several days later.  Sudden (acute) or delayed hemolytic reactions. This happens if your blood does not match the cells in your transfusion. Your body's defense system (immune system) may try to attack the new cells. This complication is rare. The symptoms include fever, chills, nausea, and low back pain or chest pain.  Infection or disease transmission. This is rare. What happens before the procedure?  You will have a blood test to determine your blood type. This is necessary to know what kind of blood your body will accept and to match it to the donor blood.  If you are going to have a planned surgery, you may be able to do an autologous blood donation. This may be done in case you need to have a transfusion.  If you have had an allergic reaction to a transfusion in the past, you may be given medicine to help prevent a reaction. This medicine may be given to you by mouth or through an IV tube.  You will have your temperature, blood pressure, and pulse monitored before the transfusion.  Follow instructions from your health care provider about eating and drinking restrictions.  Ask your health care provider about: ? Changing or stopping your regular medicines. This is especially important if you are taking diabetes medicines or blood thinners. ? Taking medicines such as aspirin and ibuprofen. These medicines can thin your blood. Do not take these medicines before your procedure if your health care provider instructs you not to. What happens during the procedure?  An IV tube will be   inserted into one of your veins.  The bag of donated blood will be attached to your IV tube. The blood will then enter through your vein.  Your temperature, blood pressure, and pulse will be monitored regularly during the transfusion. This monitoring is done to detect early signs of a transfusion reaction.  If you have any signs or symptoms of a reaction, your transfusion will be stopped and  you may be given medicine.  When the transfusion is complete, your IV tube will be removed.  Pressure may be applied to the IV site for a few minutes.  A bandage (dressing) will be applied. The procedure may vary among health care providers and hospitals. What happens after the procedure?  Your temperature, blood pressure, heart rate, breathing rate, and blood oxygen level will be monitored often.  Your blood may be tested to see how you are responding to the transfusion.  You may be warmed with fluids or blankets to maintain a normal body temperature. Summary  A blood transfusion is a procedure in which you receive donated blood, including plasma, platelets, and red blood cells, through an IV tube.  Your temperature, blood pressure, and pulse will be monitored before, during, and after the transfusion.  Your blood may be tested after the transfusion to see how your body has responded. This information is not intended to replace advice given to you by your health care provider. Make sure you discuss any questions you have with your health care provider. Document Released: 07/12/2000 Document Revised: 06/01/2018 Document Reviewed: 04/11/2016 Elsevier Patient Education  [place]. Anemia  Anemia is a condition in which you do not have enough red blood cells or hemoglobin. Hemoglobin is a substance in red blood cells that carries oxygen. When you do not have enough red blood cells or hemoglobin (are anemic), your body cannot get enough oxygen and your organs may not work properly. As a result, you may feel very tired or have other problems. What are the causes? Common causes of anemia include:  Excessive bleeding. Anemia can be caused by excessive bleeding inside or outside the body, including bleeding from the intestine or from periods in women.  Poor nutrition.  Long-lasting (chronic) kidney, thyroid, and liver disease.  Bone marrow disorders.  Cancer and treatments for  cancer.  HIV (human immunodeficiency virus) and AIDS (acquired immunodeficiency syndrome).  Treatments for HIV and AIDS.  Spleen problems.  Blood disorders.  Infections, medicines, and autoimmune disorders that destroy red blood cells. What are the signs or symptoms? Symptoms of this condition include:  Minor weakness.  Dizziness.  Headache.  Feeling heartbeats that are irregular or faster than normal (palpitations).  Shortness of breath, especially with exercise.  Paleness.  Cold sensitivity.  Indigestion.  Nausea.  Difficulty sleeping.  Difficulty concentrating. Symptoms may occur suddenly or develop slowly. If your anemia is mild, you may not have symptoms. How is this diagnosed? This condition is diagnosed based on:  Blood tests.  Your medical history.  A physical exam.  Bone marrow biopsy. Your health care provider may also check your stool (feces) for blood and may do additional testing to look for the cause of your bleeding. You may also have other tests, including:  Imaging tests, such as a CT scan or MRI.  Endoscopy.  Colonoscopy. How is this treated? Treatment for this condition depends on the cause. If you continue to lose a lot of blood, you may need to be treated at a hospital. Treatment may include:  Taking supplements of iron, vitamin V56, or folic acid.  Taking a hormone medicine (erythropoietin) that can help to stimulate red blood cell growth.  Having a blood transfusion. This may be needed if you lose a lot of blood.  Making changes to your diet.  Having surgery to remove your spleen. Follow these instructions at home:  Take over-the-counter and prescription medicines only as told by your health care provider.  Take supplements only as told by your health care provider.  Follow any diet instructions that you were given.  Keep all follow-up visits as told by your health care provider. This is important. Contact a health care  provider if:  You develop new bleeding anywhere in the body. Get help right away if:  You are very weak.  You are short of breath.  You have pain in your abdomen or chest.  You are dizzy or feel faint.  You have trouble concentrating.  You have bloody or black, tarry stools.  You vomit repeatedly or you vomit up blood. Summary  Anemia is a condition in which you do not have enough red blood cells or enough of a substance in your red blood cells that carries oxygen (hemoglobin).  Symptoms may occur suddenly or develop slowly.  If your anemia is mild, you may not have symptoms.  This condition is diagnosed with blood tests as well as a medical history and physical exam. Other tests may be needed.  Treatment for this condition depends on the cause of the anemia. This information is not intended to replace advice given to you by your health care provider. Make sure you discuss any questions you have with your health care provider. Document Released: 08/22/2004 Document Revised: 06/27/2017 Document Reviewed: 08/16/2016 Elsevier Patient Education  2020 [employer].

## 2019-03-20 LAB — TYPE AND SCREEN
ABO/RH(D): A POS
Antibody Screen: NEGATIVE
Unit division: 0

## 2019-03-20 LAB — BPAM RBC
Blood Product Expiration Date: 202009152359
ISSUE DATE / TIME: 202008211148
Unit Type and Rh: 6200

## 2019-03-23 ENCOUNTER — Inpatient Hospital Stay: Payer: Medicare Other

## 2019-03-23 ENCOUNTER — Other Ambulatory Visit: Payer: Self-pay

## 2019-03-23 DIAGNOSIS — K219 Gastro-esophageal reflux disease without esophagitis: Secondary | ICD-10-CM | POA: Diagnosis not present

## 2019-03-23 DIAGNOSIS — M3320 Polymyositis, organ involvement unspecified: Secondary | ICD-10-CM | POA: Diagnosis not present

## 2019-03-23 DIAGNOSIS — D469 Myelodysplastic syndrome, unspecified: Secondary | ICD-10-CM | POA: Diagnosis not present

## 2019-03-23 DIAGNOSIS — I10 Essential (primary) hypertension: Secondary | ICD-10-CM | POA: Diagnosis not present

## 2019-03-23 DIAGNOSIS — D61810 Antineoplastic chemotherapy induced pancytopenia: Secondary | ICD-10-CM | POA: Diagnosis not present

## 2019-03-23 DIAGNOSIS — K5900 Constipation, unspecified: Secondary | ICD-10-CM | POA: Diagnosis not present

## 2019-03-23 DIAGNOSIS — Z95828 Presence of other vascular implants and grafts: Secondary | ICD-10-CM

## 2019-03-23 DIAGNOSIS — D46Z Other myelodysplastic syndromes: Secondary | ICD-10-CM

## 2019-03-23 LAB — CMP (CANCER CENTER ONLY)
ALT: 19 U/L (ref 0–44)
AST: 20 U/L (ref 15–41)
Albumin: 3.6 g/dL (ref 3.5–5.0)
Alkaline Phosphatase: 97 U/L (ref 38–126)
Anion gap: 9 (ref 5–15)
BUN: 28 mg/dL — ABNORMAL HIGH (ref 8–23)
CO2: 23 mmol/L (ref 22–32)
Calcium: 8.6 mg/dL — ABNORMAL LOW (ref 8.9–10.3)
Chloride: 108 mmol/L (ref 98–111)
Creatinine: 1.4 mg/dL — ABNORMAL HIGH (ref 0.44–1.00)
GFR, Est AFR Am: 40 mL/min — ABNORMAL LOW (ref >60)
GFR, Estimated: 35 mL/min — ABNORMAL LOW (ref >60)
Glucose, Bld: 137 mg/dL — ABNORMAL HIGH (ref 70–99)
Potassium: 4.6 mmol/L (ref 3.5–5.1)
Sodium: 140 mmol/L (ref 135–145)
Total Bilirubin: 0.5 mg/dL (ref 0.3–1.2)
Total Protein: 6.6 g/dL (ref 6.5–8.1)

## 2019-03-23 LAB — CBC WITH DIFFERENTIAL (CANCER CENTER ONLY)
Abs Immature Granulocytes: 0.03 10*3/uL (ref 0.00–0.07)
Basophils Absolute: 0 10*3/uL (ref 0.0–0.1)
Basophils Relative: 1 %
Eosinophils Absolute: 0 10*3/uL (ref 0.0–0.5)
Eosinophils Relative: 0 %
HCT: 30.7 % — ABNORMAL LOW (ref 36.0–46.0)
Hemoglobin: 10.2 g/dL — ABNORMAL LOW (ref 12.0–15.0)
Immature Granulocytes: 1 %
Lymphocytes Relative: 17 %
Lymphs Abs: 0.6 10*3/uL — ABNORMAL LOW (ref 0.7–4.0)
MCH: 30.4 pg (ref 26.0–34.0)
MCHC: 33.2 g/dL (ref 30.0–36.0)
MCV: 91.4 fL (ref 80.0–100.0)
Monocytes Absolute: 0.2 10*3/uL (ref 0.1–1.0)
Monocytes Relative: 5 %
Neutro Abs: 2.6 10*3/uL (ref 1.7–7.7)
Neutrophils Relative %: 76 %
Platelet Count: 165 10*3/uL (ref 150–400)
RBC: 3.36 MIL/uL — ABNORMAL LOW (ref 3.87–5.11)
RDW: 13.7 % (ref 11.5–15.5)
WBC Count: 3.4 10*3/uL — ABNORMAL LOW (ref 4.0–10.5)
nRBC: 0 % (ref 0.0–0.2)

## 2019-03-23 LAB — SAMPLE TO BLOOD BANK

## 2019-03-23 MED ORDER — HEPARIN SOD (PORK) LOCK FLUSH 100 UNIT/ML IV SOLN
500.0000 [IU] | Freq: Once | INTRAVENOUS | Status: DC | PRN
  Administered 2019-03-23: 500 [IU]
  Filled 2019-03-23: qty 5

## 2019-03-23 MED ORDER — SODIUM CHLORIDE 0.9% FLUSH
10.0000 mL | INTRAVENOUS | Status: DC | PRN
  Administered 2019-03-23: 10 mL
  Filled 2019-03-23: qty 10

## 2019-03-23 NOTE — Patient Instructions (Signed)

## 2019-03-23 NOTE — Progress Notes (Unsigned)
Confirmed with Gloriajean Dell NP after reviewing today's lab results Hbg 10.2, patient does not need to received blood today.

## 2019-03-23 NOTE — Addendum Note (Signed)
Addended by: Kasandra Knudsen A on: 03/23/2019 03:36 PM   Modules accepted: Orders

## 2019-03-25 DIAGNOSIS — Z1231 Encounter for screening mammogram for malignant neoplasm of breast: Secondary | ICD-10-CM | POA: Diagnosis not present

## 2019-03-25 DIAGNOSIS — Z23 Encounter for immunization: Secondary | ICD-10-CM | POA: Diagnosis not present

## 2019-03-26 ENCOUNTER — Inpatient Hospital Stay: Payer: Medicare Other

## 2019-03-26 ENCOUNTER — Other Ambulatory Visit: Payer: Self-pay

## 2019-03-26 DIAGNOSIS — K219 Gastro-esophageal reflux disease without esophagitis: Secondary | ICD-10-CM | POA: Diagnosis not present

## 2019-03-26 DIAGNOSIS — D61810 Antineoplastic chemotherapy induced pancytopenia: Secondary | ICD-10-CM | POA: Diagnosis not present

## 2019-03-26 DIAGNOSIS — D469 Myelodysplastic syndrome, unspecified: Secondary | ICD-10-CM | POA: Diagnosis not present

## 2019-03-26 DIAGNOSIS — M3320 Polymyositis, organ involvement unspecified: Secondary | ICD-10-CM | POA: Diagnosis not present

## 2019-03-26 DIAGNOSIS — Z95828 Presence of other vascular implants and grafts: Secondary | ICD-10-CM

## 2019-03-26 DIAGNOSIS — D46Z Other myelodysplastic syndromes: Secondary | ICD-10-CM

## 2019-03-26 DIAGNOSIS — K5900 Constipation, unspecified: Secondary | ICD-10-CM | POA: Diagnosis not present

## 2019-03-26 DIAGNOSIS — I10 Essential (primary) hypertension: Secondary | ICD-10-CM | POA: Diagnosis not present

## 2019-03-26 LAB — CBC WITH DIFFERENTIAL (CANCER CENTER ONLY)
Abs Immature Granulocytes: 0.01 10*3/uL (ref 0.00–0.07)
Basophils Absolute: 0 10*3/uL (ref 0.0–0.1)
Basophils Relative: 0 %
Eosinophils Absolute: 0 10*3/uL (ref 0.0–0.5)
Eosinophils Relative: 1 %
HCT: 29.5 % — ABNORMAL LOW (ref 36.0–46.0)
Hemoglobin: 9.7 g/dL — ABNORMAL LOW (ref 12.0–15.0)
Immature Granulocytes: 0 %
Lymphocytes Relative: 30 %
Lymphs Abs: 0.7 10*3/uL (ref 0.7–4.0)
MCH: 30.3 pg (ref 26.0–34.0)
MCHC: 32.9 g/dL (ref 30.0–36.0)
MCV: 92.2 fL (ref 80.0–100.0)
Monocytes Absolute: 0.3 10*3/uL (ref 0.1–1.0)
Monocytes Relative: 11 %
Neutro Abs: 1.4 10*3/uL — ABNORMAL LOW (ref 1.7–7.7)
Neutrophils Relative %: 58 %
Platelet Count: 163 10*3/uL (ref 150–400)
RBC: 3.2 MIL/uL — ABNORMAL LOW (ref 3.87–5.11)
RDW: 14.1 % (ref 11.5–15.5)
WBC Count: 2.4 10*3/uL — ABNORMAL LOW (ref 4.0–10.5)
nRBC: 0 % (ref 0.0–0.2)

## 2019-03-26 LAB — CMP (CANCER CENTER ONLY)
ALT: 16 U/L (ref 0–44)
AST: 18 U/L (ref 15–41)
Albumin: 3.3 g/dL — ABNORMAL LOW (ref 3.5–5.0)
Alkaline Phosphatase: 83 U/L (ref 38–126)
Anion gap: 7 (ref 5–15)
BUN: 22 mg/dL (ref 8–23)
CO2: 24 mmol/L (ref 22–32)
Calcium: 8.4 mg/dL — ABNORMAL LOW (ref 8.9–10.3)
Chloride: 110 mmol/L (ref 98–111)
Creatinine: 1.21 mg/dL — ABNORMAL HIGH (ref 0.44–1.00)
GFR, Est AFR Am: 48 mL/min — ABNORMAL LOW (ref >60)
GFR, Estimated: 41 mL/min — ABNORMAL LOW (ref >60)
Glucose, Bld: 109 mg/dL — ABNORMAL HIGH (ref 70–99)
Potassium: 4.1 mmol/L (ref 3.5–5.1)
Sodium: 141 mmol/L (ref 135–145)
Total Bilirubin: 0.6 mg/dL (ref 0.3–1.2)
Total Protein: 6.1 g/dL — ABNORMAL LOW (ref 6.5–8.1)

## 2019-03-26 LAB — SAMPLE TO BLOOD BANK

## 2019-03-26 MED ORDER — HEPARIN SOD (PORK) LOCK FLUSH 100 UNIT/ML IV SOLN
500.0000 [IU] | Freq: Every day | INTRAVENOUS | Status: AC | PRN
  Administered 2019-03-26: 500 [IU]
  Filled 2019-03-26: qty 5

## 2019-03-26 MED ORDER — SODIUM CHLORIDE 0.9% FLUSH
10.0000 mL | INTRAVENOUS | Status: AC | PRN
  Administered 2019-03-26: 09:00:00 10 mL
  Filled 2019-03-26: qty 10

## 2019-03-26 MED ORDER — SODIUM CHLORIDE 0.9% FLUSH
10.0000 mL | INTRAVENOUS | Status: DC | PRN
  Administered 2019-03-26: 08:00:00 10 mL
  Filled 2019-03-26: qty 10

## 2019-03-26 NOTE — Patient Instructions (Signed)

## 2019-03-26 NOTE — Progress Notes (Signed)
Per Dr. Burr Medico patient does not need any blood products today based on her lab results.

## 2019-03-27 ENCOUNTER — Other Ambulatory Visit: Payer: Self-pay | Admitting: Hematology

## 2019-03-27 DIAGNOSIS — D46Z Other myelodysplastic syndromes: Secondary | ICD-10-CM

## 2019-03-29 ENCOUNTER — Telehealth: Payer: Self-pay | Admitting: Hematology

## 2019-03-29 NOTE — Telephone Encounter (Signed)
Per 8/29 schedule message cancelled 9/1 appointments. Confirmed with son per message.
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
## 2019-03-30 ENCOUNTER — Inpatient Hospital Stay: Payer: Medicare Other

## 2019-04-02 ENCOUNTER — Inpatient Hospital Stay: Payer: Medicare Other

## 2019-04-02 ENCOUNTER — Other Ambulatory Visit: Payer: Self-pay

## 2019-04-02 ENCOUNTER — Encounter: Payer: Self-pay | Admitting: Hematology

## 2019-04-02 ENCOUNTER — Inpatient Hospital Stay: Payer: Medicare Other | Attending: Hematology | Admitting: Hematology

## 2019-04-02 VITALS — BP 140/65 | HR 77 | Temp 98.5°F | Resp 17 | Ht 64.0 in | Wt 190.5 lb

## 2019-04-02 DIAGNOSIS — D61818 Other pancytopenia: Secondary | ICD-10-CM | POA: Insufficient documentation

## 2019-04-02 DIAGNOSIS — R5383 Other fatigue: Secondary | ICD-10-CM | POA: Insufficient documentation

## 2019-04-02 DIAGNOSIS — D469 Myelodysplastic syndrome, unspecified: Secondary | ICD-10-CM | POA: Diagnosis not present

## 2019-04-02 DIAGNOSIS — I10 Essential (primary) hypertension: Secondary | ICD-10-CM | POA: Diagnosis not present

## 2019-04-02 DIAGNOSIS — R0602 Shortness of breath: Secondary | ICD-10-CM | POA: Diagnosis not present

## 2019-04-02 DIAGNOSIS — D46Z Other myelodysplastic syndromes: Secondary | ICD-10-CM | POA: Diagnosis not present

## 2019-04-02 DIAGNOSIS — D6959 Other secondary thrombocytopenia: Secondary | ICD-10-CM | POA: Insufficient documentation

## 2019-04-02 DIAGNOSIS — M3320 Polymyositis, organ involvement unspecified: Secondary | ICD-10-CM | POA: Diagnosis not present

## 2019-04-02 DIAGNOSIS — Z79899 Other long term (current) drug therapy: Secondary | ICD-10-CM | POA: Insufficient documentation

## 2019-04-02 LAB — CBC WITH DIFFERENTIAL (CANCER CENTER ONLY)
Abs Immature Granulocytes: 0.02 10*3/uL (ref 0.00–0.07)
Basophils Absolute: 0.1 10*3/uL (ref 0.0–0.1)
Basophils Relative: 2 %
Eosinophils Absolute: 0 10*3/uL (ref 0.0–0.5)
Eosinophils Relative: 1 %
HCT: 31.3 % — ABNORMAL LOW (ref 36.0–46.0)
Hemoglobin: 10.4 g/dL — ABNORMAL LOW (ref 12.0–15.0)
Immature Granulocytes: 1 %
Lymphocytes Relative: 25 %
Lymphs Abs: 0.8 10*3/uL (ref 0.7–4.0)
MCH: 30.8 pg (ref 26.0–34.0)
MCHC: 33.2 g/dL (ref 30.0–36.0)
MCV: 92.6 fL (ref 80.0–100.0)
Monocytes Absolute: 0.3 10*3/uL (ref 0.1–1.0)
Monocytes Relative: 8 %
Neutro Abs: 2.1 10*3/uL (ref 1.7–7.7)
Neutrophils Relative %: 63 %
Platelet Count: 247 10*3/uL (ref 150–400)
RBC: 3.38 MIL/uL — ABNORMAL LOW (ref 3.87–5.11)
RDW: 15.3 % (ref 11.5–15.5)
WBC Count: 3.3 10*3/uL — ABNORMAL LOW (ref 4.0–10.5)
nRBC: 0 % (ref 0.0–0.2)

## 2019-04-02 LAB — CMP (CANCER CENTER ONLY)
ALT: 18 U/L (ref 0–44)
AST: 22 U/L (ref 15–41)
Albumin: 3.7 g/dL (ref 3.5–5.0)
Alkaline Phosphatase: 80 U/L (ref 38–126)
Anion gap: 9 (ref 5–15)
BUN: 23 mg/dL (ref 8–23)
CO2: 24 mmol/L (ref 22–32)
Calcium: 8.8 mg/dL — ABNORMAL LOW (ref 8.9–10.3)
Chloride: 107 mmol/L (ref 98–111)
Creatinine: 1.18 mg/dL — ABNORMAL HIGH (ref 0.44–1.00)
GFR, Est AFR Am: 49 mL/min — ABNORMAL LOW (ref >60)
GFR, Estimated: 43 mL/min — ABNORMAL LOW (ref >60)
Glucose, Bld: 109 mg/dL — ABNORMAL HIGH (ref 70–99)
Potassium: 4.1 mmol/L (ref 3.5–5.1)
Sodium: 140 mmol/L (ref 135–145)
Total Bilirubin: 0.6 mg/dL (ref 0.3–1.2)
Total Protein: 6.5 g/dL (ref 6.5–8.1)

## 2019-04-02 LAB — SAMPLE TO BLOOD BANK

## 2019-04-02 MED ORDER — HEPARIN SOD (PORK) LOCK FLUSH 100 UNIT/ML IV SOLN
500.0000 [IU] | Freq: Once | INTRAVENOUS | Status: AC
  Administered 2019-04-02: 12:00:00 500 [IU] via INTRAVENOUS
  Filled 2019-04-02: qty 5

## 2019-04-02 NOTE — Patient Instructions (Signed)

## 2019-04-07 ENCOUNTER — Telehealth: Payer: Self-pay | Admitting: Hematology

## 2019-04-07 NOTE — Telephone Encounter (Signed)
Called and spoke with patients son. Confirmed upcoming appts

## 2019-04-09 ENCOUNTER — Inpatient Hospital Stay: Payer: Medicare Other

## 2019-04-09 ENCOUNTER — Other Ambulatory Visit: Payer: Self-pay

## 2019-04-09 DIAGNOSIS — D61818 Other pancytopenia: Secondary | ICD-10-CM | POA: Diagnosis not present

## 2019-04-09 DIAGNOSIS — R0602 Shortness of breath: Secondary | ICD-10-CM | POA: Diagnosis not present

## 2019-04-09 DIAGNOSIS — I10 Essential (primary) hypertension: Secondary | ICD-10-CM | POA: Diagnosis not present

## 2019-04-09 DIAGNOSIS — D6959 Other secondary thrombocytopenia: Secondary | ICD-10-CM | POA: Diagnosis not present

## 2019-04-09 DIAGNOSIS — M3320 Polymyositis, organ involvement unspecified: Secondary | ICD-10-CM | POA: Diagnosis not present

## 2019-04-09 DIAGNOSIS — Z95828 Presence of other vascular implants and grafts: Secondary | ICD-10-CM

## 2019-04-09 DIAGNOSIS — D469 Myelodysplastic syndrome, unspecified: Secondary | ICD-10-CM | POA: Diagnosis not present

## 2019-04-09 DIAGNOSIS — D46Z Other myelodysplastic syndromes: Secondary | ICD-10-CM

## 2019-04-09 LAB — CBC WITH DIFFERENTIAL (CANCER CENTER ONLY)
Abs Immature Granulocytes: 0.03 10*3/uL (ref 0.00–0.07)
Basophils Absolute: 0.2 10*3/uL — ABNORMAL HIGH (ref 0.0–0.1)
Basophils Relative: 3 %
Eosinophils Absolute: 0 10*3/uL (ref 0.0–0.5)
Eosinophils Relative: 0 %
HCT: 33.5 % — ABNORMAL LOW (ref 36.0–46.0)
Hemoglobin: 11.1 g/dL — ABNORMAL LOW (ref 12.0–15.0)
Immature Granulocytes: 0 %
Lymphocytes Relative: 14 %
Lymphs Abs: 1 10*3/uL (ref 0.7–4.0)
MCH: 30.4 pg (ref 26.0–34.0)
MCHC: 33.1 g/dL (ref 30.0–36.0)
MCV: 91.8 fL (ref 80.0–100.0)
Monocytes Absolute: 0.3 10*3/uL (ref 0.1–1.0)
Monocytes Relative: 5 %
Neutro Abs: 5.3 10*3/uL (ref 1.7–7.7)
Neutrophils Relative %: 78 %
Platelet Count: 240 10*3/uL (ref 150–400)
RBC: 3.65 MIL/uL — ABNORMAL LOW (ref 3.87–5.11)
RDW: 14.8 % (ref 11.5–15.5)
WBC Count: 6.9 10*3/uL (ref 4.0–10.5)
nRBC: 0 % (ref 0.0–0.2)

## 2019-04-09 LAB — SAMPLE TO BLOOD BANK

## 2019-04-09 MED ORDER — HEPARIN SOD (PORK) LOCK FLUSH 100 UNIT/ML IV SOLN
500.0000 [IU] | Freq: Once | INTRAVENOUS | Status: AC
  Administered 2019-04-09: 500 [IU] via INTRAVENOUS
  Filled 2019-04-09: qty 5

## 2019-04-09 MED ORDER — SODIUM CHLORIDE 0.9% FLUSH
10.0000 mL | INTRAVENOUS | Status: DC | PRN
  Administered 2019-04-09: 10 mL
  Filled 2019-04-09: qty 10

## 2019-04-09 MED ORDER — HEPARIN SOD (PORK) LOCK FLUSH 100 UNIT/ML IV SOLN
500.0000 [IU] | Freq: Once | INTRAVENOUS | Status: AC | PRN
  Administered 2019-04-09: 500 [IU]
  Filled 2019-04-09: qty 5

## 2019-04-09 NOTE — Addendum Note (Signed)
Addended by: Jonnie Finner on: 04/09/2019 12:25 PM   Modules accepted: Orders, SmartSet

## 2019-04-12 ENCOUNTER — Telehealth: Payer: Self-pay

## 2019-04-12 NOTE — Telephone Encounter (Signed)
Faxed lab results to Dr. Randa Spike at [facility] for his review, fax [phone]

## 2019-04-14 DIAGNOSIS — N182 Chronic kidney disease, stage 2 (mild): Secondary | ICD-10-CM | POA: Diagnosis present

## 2019-04-14 DIAGNOSIS — Z20828 Contact with and (suspected) exposure to other viral communicable diseases: Secondary | ICD-10-CM | POA: Diagnosis present

## 2019-04-14 DIAGNOSIS — Z95828 Presence of other vascular implants and grafts: Secondary | ICD-10-CM | POA: Diagnosis not present

## 2019-04-14 DIAGNOSIS — Z7952 Long term (current) use of systemic steroids: Secondary | ICD-10-CM | POA: Diagnosis not present

## 2019-04-14 DIAGNOSIS — K5900 Constipation, unspecified: Secondary | ICD-10-CM | POA: Diagnosis present

## 2019-04-14 DIAGNOSIS — Z888 Allergy status to other drugs, medicaments and biological substances status: Secondary | ICD-10-CM | POA: Diagnosis not present

## 2019-04-14 DIAGNOSIS — M3320 Polymyositis, organ involvement unspecified: Secondary | ICD-10-CM | POA: Diagnosis present

## 2019-04-14 DIAGNOSIS — K219 Gastro-esophageal reflux disease without esophagitis: Secondary | ICD-10-CM | POA: Diagnosis present

## 2019-04-14 DIAGNOSIS — M5481 Occipital neuralgia: Secondary | ICD-10-CM | POA: Diagnosis present

## 2019-04-14 DIAGNOSIS — M47812 Spondylosis without myelopathy or radiculopathy, cervical region: Secondary | ICD-10-CM | POA: Diagnosis present

## 2019-04-14 DIAGNOSIS — Z79899 Other long term (current) drug therapy: Secondary | ICD-10-CM | POA: Diagnosis not present

## 2019-04-14 DIAGNOSIS — H905 Unspecified sensorineural hearing loss: Secondary | ICD-10-CM | POA: Diagnosis present

## 2019-04-14 DIAGNOSIS — D469 Myelodysplastic syndrome, unspecified: Secondary | ICD-10-CM | POA: Diagnosis not present

## 2019-04-14 DIAGNOSIS — I129 Hypertensive chronic kidney disease with stage 1 through stage 4 chronic kidney disease, or unspecified chronic kidney disease: Secondary | ICD-10-CM | POA: Diagnosis present

## 2019-04-14 DIAGNOSIS — I10 Essential (primary) hypertension: Secondary | ICD-10-CM | POA: Diagnosis not present

## 2019-04-14 DIAGNOSIS — Z5111 Encounter for antineoplastic chemotherapy: Secondary | ICD-10-CM | POA: Diagnosis not present

## 2019-04-14 DIAGNOSIS — E1122 Type 2 diabetes mellitus with diabetic chronic kidney disease: Secondary | ICD-10-CM | POA: Diagnosis present

## 2019-04-14 DIAGNOSIS — D46Z Other myelodysplastic syndromes: Secondary | ICD-10-CM | POA: Diagnosis present

## 2019-04-14 DIAGNOSIS — N951 Menopausal and female climacteric states: Secondary | ICD-10-CM | POA: Diagnosis present

## 2019-04-14 DIAGNOSIS — Z886 Allergy status to analgesic agent status: Secondary | ICD-10-CM | POA: Diagnosis not present

## 2019-04-14 DIAGNOSIS — E119 Type 2 diabetes mellitus without complications: Secondary | ICD-10-CM | POA: Diagnosis not present

## 2019-04-22 ENCOUNTER — Telehealth: Payer: Self-pay | Admitting: Hematology

## 2019-04-22 NOTE — Telephone Encounter (Signed)
Patient son Lissete Rooker called [phone]) re cancelling 9/25 appointments and setting up appointment for the following Friday (10/2).   Per Son patient just discharged from [facility] and per Dr Leretha Pol does not need lab at [facility] 9/25 due to counts are good. Per son patient needs appointment for lab/port 10/2 and q2w per Dr. Leretha Pol. Per son he does not know if Dr. Leretha Pol has been in touch with Dr. Burr Medico. Son informed I would call back after speaking with Dr. Burr Medico.  Not able to reach Dr. Burr Medico. Spoke with desk nurse re above and per desk nurse cancelled 9/25 appointments, no appointments 10/2 and patient will f/u as scheduled 10/5. Per desk nurse son to call office if patient becomes symptomatic.  Returned call to son and informed him of above confirming 10/5 appointments. Per Son he cannot come in the morning. He leaves work to bring patient to appointments, cannot come before 1 pm and appointments need to be on Monday or Tuesday. Moved appointments from AM to PM and confirmed with son appointments for lab/port/feng/blood at 1 pm on 10/5.

## 2019-04-23 ENCOUNTER — Inpatient Hospital Stay: Payer: Medicare Other

## 2019-04-27 ENCOUNTER — Other Ambulatory Visit: Payer: Self-pay

## 2019-04-27 ENCOUNTER — Telehealth: Payer: Self-pay

## 2019-04-27 DIAGNOSIS — R0602 Shortness of breath: Secondary | ICD-10-CM

## 2019-04-27 DIAGNOSIS — D46Z Other myelodysplastic syndromes: Secondary | ICD-10-CM

## 2019-04-27 NOTE — Telephone Encounter (Signed)
Patient's son left a voicemail saying he thought patient was symptomatic and needed to be seen by a provider. Called son back to get more information and was informed that patient was tired, weak, and occasionally SOB. Asked son if he would be able to bring patient in today or tomorrow, and he advised he would be able to bring patient in tomorrow after 11am. Informed son I would check with our [facility] to see if there were any openings tomorrow and that I would call him back with an update.   Updated Alfredia Client on the situation, who stated he had availability to see patient and to place orders for CBC, CMP, and CXR. High priority scheduling message sent to make a [place] appointment, and orders placed.   Called son back with update to let him know patient had 12:30 lab appointment and then 13:00 appointment to see Lucianne Lei. Advised him that patient would need to have her CXR done first, and to just let the registration know so a NT/RN can escort the patient to radiology and then bring her back for her lab/provider visits. Son verbalized understanding and agreement, and denied any other needs at this time.

## 2019-04-28 ENCOUNTER — Inpatient Hospital Stay: Payer: Medicare Other

## 2019-04-28 ENCOUNTER — Ambulatory Visit (HOSPITAL_COMMUNITY)
Admission: RE | Admit: 2019-04-28 | Discharge: 2019-04-28 | Disposition: A | Discharge status: Home / Self Care | Payer: Medicare Other | Source: Ambulatory Visit | Attending: Medical | Admitting: Medical

## 2019-04-28 ENCOUNTER — Other Ambulatory Visit: Payer: Self-pay

## 2019-04-28 ENCOUNTER — Other Ambulatory Visit: Payer: Self-pay | Admitting: Emergency Medicine

## 2019-04-28 ENCOUNTER — Inpatient Hospital Stay (HOSPITAL_BASED_OUTPATIENT_CLINIC_OR_DEPARTMENT_OTHER): Payer: Medicare Other | Admitting: Medical

## 2019-04-28 VITALS — BP 156/98 | HR 72 | Temp 98.2°F | Resp 18

## 2019-04-28 DIAGNOSIS — M3320 Polymyositis, organ involvement unspecified: Secondary | ICD-10-CM | POA: Diagnosis not present

## 2019-04-28 DIAGNOSIS — R0602 Shortness of breath: Secondary | ICD-10-CM | POA: Diagnosis not present

## 2019-04-28 DIAGNOSIS — Z95828 Presence of other vascular implants and grafts: Secondary | ICD-10-CM

## 2019-04-28 DIAGNOSIS — D6959 Other secondary thrombocytopenia: Secondary | ICD-10-CM | POA: Diagnosis not present

## 2019-04-28 DIAGNOSIS — D46Z Other myelodysplastic syndromes: Secondary | ICD-10-CM

## 2019-04-28 DIAGNOSIS — D61818 Other pancytopenia: Secondary | ICD-10-CM | POA: Diagnosis not present

## 2019-04-28 DIAGNOSIS — I10 Essential (primary) hypertension: Secondary | ICD-10-CM | POA: Diagnosis not present

## 2019-04-28 DIAGNOSIS — D469 Myelodysplastic syndrome, unspecified: Secondary | ICD-10-CM | POA: Diagnosis not present

## 2019-04-28 LAB — CMP (CANCER CENTER ONLY)
ALT: 18 U/L (ref 0–44)
AST: 23 U/L (ref 15–41)
Albumin: 3.7 g/dL (ref 3.5–5.0)
Alkaline Phosphatase: 70 U/L (ref 38–126)
Anion gap: 9 (ref 5–15)
BUN: 30 mg/dL — ABNORMAL HIGH (ref 8–23)
CO2: 25 mmol/L (ref 22–32)
Calcium: 8.7 mg/dL — ABNORMAL LOW (ref 8.9–10.3)
Chloride: 108 mmol/L (ref 98–111)
Creatinine: 1.3 mg/dL — ABNORMAL HIGH (ref 0.44–1.00)
GFR, Est AFR Am: 44 mL/min — ABNORMAL LOW (ref >60)
GFR, Estimated: 38 mL/min — ABNORMAL LOW (ref >60)
Glucose, Bld: 113 mg/dL — ABNORMAL HIGH (ref 70–99)
Potassium: 4.6 mmol/L (ref 3.5–5.1)
Sodium: 142 mmol/L (ref 135–145)
Total Bilirubin: 0.5 mg/dL (ref 0.3–1.2)
Total Protein: 6.8 g/dL (ref 6.5–8.1)

## 2019-04-28 LAB — CBC WITH DIFFERENTIAL (CANCER CENTER ONLY)
Abs Immature Granulocytes: 0.06 10*3/uL (ref 0.00–0.07)
Basophils Absolute: 0 10*3/uL (ref 0.0–0.1)
Basophils Relative: 1 %
Eosinophils Absolute: 0.1 10*3/uL (ref 0.0–0.5)
Eosinophils Relative: 1 %
HCT: 31.7 % — ABNORMAL LOW (ref 36.0–46.0)
Hemoglobin: 10.8 g/dL — ABNORMAL LOW (ref 12.0–15.0)
Immature Granulocytes: 1 %
Lymphocytes Relative: 14 %
Lymphs Abs: 0.7 10*3/uL (ref 0.7–4.0)
MCH: 31.2 pg (ref 26.0–34.0)
MCHC: 34.1 g/dL (ref 30.0–36.0)
MCV: 91.6 fL (ref 80.0–100.0)
Monocytes Absolute: 0.1 10*3/uL (ref 0.1–1.0)
Monocytes Relative: 2 %
Neutro Abs: 4.2 10*3/uL (ref 1.7–7.7)
Neutrophils Relative %: 81 %
Platelet Count: 103 10*3/uL — ABNORMAL LOW (ref 150–400)
RBC: 3.46 MIL/uL — ABNORMAL LOW (ref 3.87–5.11)
RDW: 13.9 % (ref 11.5–15.5)
WBC Count: 5.2 10*3/uL (ref 4.0–10.5)
nRBC: 0 % (ref 0.0–0.2)

## 2019-04-28 MED ORDER — HEPARIN SOD (PORK) LOCK FLUSH 100 UNIT/ML IV SOLN
500.0000 [IU] | Freq: Once | INTRAVENOUS | Status: AC | PRN
  Administered 2019-04-28: 500 [IU]
  Filled 2019-04-28: qty 5

## 2019-04-28 MED ORDER — SODIUM CHLORIDE 0.9% FLUSH
10.0000 mL | INTRAVENOUS | Status: DC | PRN
  Administered 2019-04-28: 10 mL
  Filled 2019-04-28: qty 10

## 2019-04-28 NOTE — Progress Notes (Signed)
Symptoms Management Clinic Progress Note   Heather Olson [id number] 1932-08-24 85 y.o.  Heather Olson is managed by Dr. Truitt Merle  Actively treated with chemotherapy/immunotherapy/hormonal therapy: yes  Current therapy: Decitabine  Last treated: 04/14/2019  Next scheduled appointment with provider: 05/03/2019  Assessment: Plan:    Port-A-Cath in place - Plan: heparin lock flush 100 unit/mL, sodium chloride flush (NS) 0.9 % injection 10 mL  MDS (myelodysplastic syndrome), high grade ([place])  Shortness of breath   MDS: The patient continues to be managed by Dr. Truitt Merle.  She continues to be treated with decitabine with her last dose given on 04/14/2019.  Shortness of breath: A chest x-ray was completed which returned showing no acute abnormalities of the lung.  Her CBC returned with a hemoglobin of 10.8 and hematocrit of 31.7.  She was scheduled to see Dr. Morey Hummingbird on 05/03/2019.  This appointment will be canceled and will be rescheduled at a later date.   Please see After Visit Summary for patient specific instructions.  Future Appointments  Date Time Provider [place]  05/03/2019  1:00 PM [facility] LAB 2 [facility] None  05/03/2019  1:15 PM [facility] [place] FLUSH [facility] None  05/03/2019  1:40 PM Truitt Merle, MD [facility] None  05/03/2019  2:45 PM [facility] INFUSION [facility] None  05/10/2019  1:15 PM [facility] LAB 3 [facility] None  05/10/2019  1:30 PM [facility] [place] FLUSH [facility] None    No orders of the defined types were placed in this encounter.      Subjective:   Patient ID:  Heather Olson is a 85 y.o. (DOB Jun 09, 1933) female.  Chief Complaint:  Chief Complaint  Patient presents with  . Shortness of Breath    HPI Heather Olson  Is a 85 y.o. female with a diagnosis of with a diagnosis of myelodysplasia who is managed by Dr. Truitt Merle and is treated with decitabine.  She has been scheduled to return for follow-up on 05/03/2019 but presented to  the clinic today with a report of shortness of breath at rest and fatigue.  Her son encouraged her to come today as they were concerned that she could possibly need a transfusion.  Medications: I have reviewed the patient's current medications.  Allergies:  Allergies  Allergen Reactions  . Butalbital-Apap-Caffeine Itching    Patient reports itching after taking x 1 tablet, so she stopped taking medication  . Lisinopril Cough    Past Medical History:  Diagnosis Date  . Arthritis   . GERD (gastroesophageal reflux disease)   . Headache(784.0)   . Hypertension   . Polymyositis ([place])   . Swelling of both ankles     Past Surgical History:  Procedure Laterality Date  . ABDOMINAL HYSTERECTOMY    . APPENDECTOMY    . CHOLECYSTECTOMY N/A 05/28/2013   Procedure: LAPAROSCOPIC CHOLECYSTECTOMY ;  Surgeon: Gwenyth Ober, MD;  Location: [facility] OR;  Service: General;  Laterality: N/A;  . TONSILLECTOMY      Family History  Problem Relation Age of Onset  . Diabetes Mother   . Cancer Daughter 6       ovarian cancer     Social History   Socioeconomic History  . Marital status: Widowed    Spouse name: Not on file  . Number of children: 3  . Years of education: Not on file  . Highest education level: Not on file  Occupational History  . Not on file  Social Needs  . Financial resource strain: Not on file  .  Food insecurity    Worry: Not on file    Inability: Not on file  . Transportation needs    Medical: Not on file    Non-medical: Not on file  Tobacco Use  . Smoking status: Never Smoker  . Smokeless tobacco: Never Used  Substance and Sexual Activity  . Alcohol use: No  . Drug use: No  . Sexual activity: Not on file  Lifestyle  . Physical activity    Days per week: Not on file    Minutes per session: Not on file  . Stress: Not on file  Relationships  . Social Herbalist on phone: Not on file    Gets together: Not on file    Attends religious service: Not on file     Active member of club or organization: Not on file    Attends meetings of clubs or organizations: Not on file    Relationship status: Not on file  . Intimate partner violence    Fear of current or ex partner: Not on file    Emotionally abused: Not on file    Physically abused: Not on file    Forced sexual activity: Not on file  Other Topics Concern  . Not on file  Social History Narrative  . Not on file    Past Medical History, Surgical history, Social history, and Family history were reviewed and updated as appropriate.   Please see review of systems for further details on the patient's review from today.   Review of Systems:  Review of Systems  Constitutional: Positive for fatigue. Negative for appetite change, chills, diaphoresis and fever.  HENT: Negative for dental problem, mouth sores and trouble swallowing.   Respiratory: Positive for shortness of breath. Negative for cough and chest tightness.   Cardiovascular: Negative for chest pain and palpitations.  Gastrointestinal: Negative for constipation, diarrhea, nausea and vomiting.  Neurological: Positive for weakness. Negative for dizziness, syncope and headaches.    Objective:   Physical Exam:  BP (!) 156/98 (BP Location: Right Arm, Patient Position: Sitting)   Pulse 72   Temp 98.2 F (36.8 C) (Oral)   Resp 18   SpO2 100%  ECOG: 0  Physical Exam Constitutional:      General: She is not in acute distress.    Appearance: She is not diaphoretic.  HENT:     Head: Normocephalic and atraumatic.  Cardiovascular:     Rate and Rhythm: Normal rate and regular rhythm.     Heart sounds: Murmur present. Systolic murmur present with a grade of 3/6. No friction rub. No gallop.   Pulmonary:     Effort: Pulmonary effort is normal. No respiratory distress.     Breath sounds: Normal breath sounds. No wheezing or rales.  Skin:    General: Skin is warm and dry.     Findings: No erythema or rash.  Neurological:     Mental  Status: She is alert.  Psychiatric:        Mood and Affect: Mood normal. Mood is not anxious.        Behavior: Behavior normal. Behavior is not agitated.     Lab Review:     Component Value Date/Time   NA 140 04/02/2019 1130   K 4.1 04/02/2019 1130   CL 107 04/02/2019 1130   CO2 24 04/02/2019 1130   GLUCOSE 109 (H) 04/02/2019 1130   BUN 23 04/02/2019 1130   CREATININE 1.18 (H) 04/02/2019 1130   CALCIUM  8.8 (L) 04/02/2019 1130   PROT 6.5 04/02/2019 1130   ALBUMIN 3.7 04/02/2019 1130   AST 22 04/02/2019 1130   ALT 18 04/02/2019 1130   ALKPHOS 80 04/02/2019 1130   BILITOT 0.6 04/02/2019 1130   GFRNONAA 43 (L) 04/02/2019 1130   GFRAA 49 (L) 04/02/2019 1130       Component Value Date/Time   WBC 5.2 04/28/2019 1355   WBC 1.5 (L) 10/24/2018 1630   RBC 3.46 (L) 04/28/2019 1355   HGB 10.8 (L) 04/28/2019 1355   HCT 31.7 (L) 04/28/2019 1355   PLT 103 (L) 04/28/2019 1355   MCV 91.6 04/28/2019 1355   MCH 31.2 04/28/2019 1355   MCHC 34.1 04/28/2019 1355   RDW 13.9 04/28/2019 1355   LYMPHSABS PENDING 04/28/2019 1355   MONOABS PENDING 04/28/2019 1355   EOSABS PENDING 04/28/2019 1355   BASOSABS PENDING 04/28/2019 1355   -------------------------------  Imaging from last 24 hours (if applicable):  Radiology interpretation: Dg Chest 2 View  Result Date: 04/28/2019 CLINICAL DATA:  Shortness of breath EXAM: CHEST - 2 VIEW COMPARISON:  10/24/2018 FINDINGS: The heart size and mediastinal contours are within normal limits. Right chest port catheter. Both lungs are clear. Disc degenerative disease thoracic spine. IMPRESSION: No acute abnormality of the lungs. Electronically Signed   By: Eddie Candle M.D.   On: 04/28/2019 12:58

## 2019-04-28 NOTE — Patient Instructions (Signed)

## 2019-05-03 ENCOUNTER — Inpatient Hospital Stay: Payer: Medicare Other

## 2019-05-03 ENCOUNTER — Inpatient Hospital Stay: Payer: Medicare Other | Admitting: Hematology

## 2019-05-03 ENCOUNTER — Other Ambulatory Visit: Payer: Medicare Other

## 2019-05-03 ENCOUNTER — Inpatient Hospital Stay: Payer: Medicare Other | Attending: Hematology

## 2019-05-03 ENCOUNTER — Ambulatory Visit: Payer: Medicare Other | Admitting: Hematology

## 2019-05-03 DIAGNOSIS — D469 Myelodysplastic syndrome, unspecified: Secondary | ICD-10-CM | POA: Insufficient documentation

## 2019-05-03 DIAGNOSIS — Z452 Encounter for adjustment and management of vascular access device: Secondary | ICD-10-CM | POA: Insufficient documentation

## 2019-05-07 ENCOUNTER — Other Ambulatory Visit: Payer: Medicare Other

## 2019-05-10 ENCOUNTER — Inpatient Hospital Stay: Payer: Medicare Other

## 2019-05-10 ENCOUNTER — Other Ambulatory Visit: Payer: Medicare Other

## 2019-05-12 DIAGNOSIS — D469 Myelodysplastic syndrome, unspecified: Secondary | ICD-10-CM | POA: Diagnosis not present

## 2019-05-13 ENCOUNTER — Telehealth: Payer: Self-pay | Admitting: Hematology

## 2019-05-13 NOTE — Telephone Encounter (Signed)
Returned patient's phone call regarding rescheduling an appointment, left a voicemail. 

## 2019-05-23 NOTE — Progress Notes (Signed)
Heather Olson   [phone] Fax:([phone]   Clinic Follow up Note   Patient Care Team: Chetty, Golden Pop, MD as PCP - General (Family Medicine) 05/24/2019  CHIEF COMPLAINT: F/u MDS  SUMMARY OF ONCOLOGIC HISTORY: Oncology History  MDS (myelodysplastic syndrome), high grade ([place])  11/13/2018 Bone Marrow Biopsy   1. BM biopsy & aspirate 11/13/18: hypercellular marrow (40-50%) with markedly left shifted erythroid predominance and dysplasia. 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 Negative for increased blasts. Associated flow cytometry demonstrated no discrete population of atypical blasts. The findings were consistent with myelodysplastic syndrome with multi-lineage dysplasia (MDS-MLD). While the aspirate smear and IHC staining showed a marked increase in early erythroid precursors, the strict criteria for acute erythroleukemia were NOT met. However, close clinical follow up is required as further disease progression may occur. MDS FISH panel showed 35.5% of cells showed monosomy 5 by interphase FISH; 2-3.5% showed signal patterns consistent with additional copies of chromosomes 7, 8, and 20. The significance, if any, of this low level cell population is uncertain.    02/06/2019 -  Chemotherapy   Monhtly Decitibine as inpatient for 5 days starting on 02/06/19 at [facility]. Plan for 4-6 months.     02/18/2019 Initial Diagnosis   MDS (myelodysplastic syndrome), high grade ([place])     CURRENT THERAPY:   -Monthly Decitabinestarting 02/06/19 at [place]  -Blood and platelet transfusions as needed  INTERVAL HISTORY: Heather Olson returns for f/u as scheduled. She received last cycle decitabine at [facility] on 05/14/19. She feels well. Has good appetite. She is trying to be active. Denies fever or chills. Denies n/v/d. Manages constipation with PRN medication. No bleeding. Denies cough, chest pain, dyspnea, swelling. She has stable hot flashes. No neuropathy. No  mucositis. No rash. Denies new or worsening pain.    MEDICAL HISTORY:  Past Medical History:  Diagnosis Date   Arthritis    GERD (gastroesophageal reflux disease)    Headache(784.0)    Hypertension    Polymyositis (HCC)    Swelling of both ankles     SURGICAL HISTORY: Past Surgical History:  Procedure Laterality Date   ABDOMINAL HYSTERECTOMY     APPENDECTOMY     CHOLECYSTECTOMY N/A 05/28/2013   Procedure: LAPAROSCOPIC CHOLECYSTECTOMY ;  Surgeon: Gwenyth Ober, MD;  Location: [place];  Service: General;  Laterality: N/A;   TONSILLECTOMY      I have reviewed the social history and family history with the patient and they are unchanged from previous note.  ALLERGIES:  is allergic to butalbital-apap-caffeine and lisinopril.  MEDICATIONS:  Current Outpatient Medications  Medication Sig Dispense Refill   acetaminophen (TYLENOL) 650 MG CR tablet Take 650 mg by mouth every 8 (eight) hours as needed for pain.     acyclovir (ZOVIRAX) 200 MG capsule      Decitabine-Cedazuridine 35-100 MG TABS Take by mouth.     hydrochlorothiazide (HYDRODIURIL) 25 MG tablet Take 25 mg by mouth daily.     losartan (COZAAR) 50 MG tablet Take 50 mg by mouth daily.     Multiple Vitamin (MULTIVITAMIN WITH MINERALS) TABS tablet Take 1 tablet by mouth daily.     OMEGA-3 FATTY ACIDS PO Take by mouth.     omeprazole (PRILOSEC) 40 MG capsule Take 40 mg by mouth daily.      divalproex (DEPAKOTE) 250 MG DR tablet Take 250-500 mg by mouth See admin instructions. Take 2 tablets (500 mg) by mouth every morning and 1 tablet (250 mg) at night -  For occipital neuralgia     FLUZONE HIGH-DOSE QUADRIVALENT 0.7 ML SUSY ADM 0.7ML IM UTD     predniSONE (DELTASONE) 5 MG tablet Take 5 mg by mouth daily.     traMADol (ULTRAM) 50 MG tablet Take 0.5 tablets (25 mg total) by mouth every 6 (six) hours as needed for severe pain. 15 tablet 0   No current facility-administered medications for this visit.      PHYSICAL EXAMINATION: ECOG PERFORMANCE STATUS: 0 - Asymptomatic  Vitals:   05/24/19 1551  BP: (!) 149/68  Pulse: 83  Resp: 17  Temp: 98 F (36.7 C)  SpO2: 98%   Filed Weights   05/24/19 1551  Weight: 192 lb 14.4 oz (87.5 kg)    GENERAL:alert, no distress and comfortable SKIN: no rash  EYES: sclera clear LUNGS: clear with normal breathing effort HEART: regular rate & rhythm, no lower extremity edema ABDOMEN: abdomen soft, non-tender and normal bowel sounds NEURO: alert & oriented x 3 with fluent speech PAC without erythema   LABORATORY DATA:  I have reviewed the data as listed CBC Latest Ref Rng & Units 05/24/2019 04/28/2019 04/09/2019  WBC 4.0 - 10.5 K/uL 4.7 5.2 6.9  Hemoglobin 12.0 - 15.0 g/dL 12.7 10.8(L) 11.1(L)  Hematocrit 36.0 - 46.0 % 37.1 31.7(L) 33.5(L)  Platelets 150 - 400 K/uL 166 103(L) 240     CMP Latest Ref Rng & Units 05/24/2019 04/28/2019 04/02/2019  Glucose 70 - 99 mg/dL 132(H) 113(H) 109(H)  BUN 8 - 23 mg/dL 24(H) 30(H) 23  Creatinine 0.44 - 1.00 mg/dL 1.20(H) 1.30(H) 1.18(H)  Sodium 135 - 145 mmol/L 139 142 140  Potassium 3.5 - 5.1 mmol/L 4.4 4.6 4.1  Chloride 98 - 111 mmol/L 106 108 107  CO2 22 - 32 mmol/L _0 Calcium 8.9 - 10.3 mg/dL 8.6(L) 8.7(L) 8.8(L)  Total Protein 6.5 - 8.1 g/dL 6.5 6.8 6.5  Total Bilirubin 0.3 - 1.2 mg/dL 0.5 0.5 0.6  Alkaline Phos 38 - 126 U/L 69 70 80  AST 15 - 41 U/L _1 ALT 0 - 44 U/L _2 RADIOGRAPHIC STUDIES: I have personally reviewed the radiological images as listed and agreed with the findings in the report. No results found.   ASSESSMENT & PLAN: Heather Olson is a 85 y.o. female with   1. MDSwith pancytopenia, R-IPSS 4.0, intermediate risk  -Presented with initial anemia then pancytopenia. Admitted at [facility] twice this year due to loss of consciousness and headaches. She underwent Bone marrow biopsy on 11/13/18 which showed evidence of MDS.Cytogenetics positive for chromosome 5  deletion, and low level (2-3.5%) of additionalcopies of chromosome 7, 8 and 20 based on FISH.Based on his R-IPSS she has intermediate risk disease with median survival of 3 years -Sheinitially was on supportive care with blood transfusion, and all treatment, due to the worsening cytopenia,Dr. Leretha Pol recommended treatment with chemo Decitabine  -She has PAC in place.  - Dr. Leretha Pol she started monthly Decitabine as inpatient for 5 days on 02/06/19 at [facility] Plan for 4-6 months. S/p cycle 4 on 10/16. She tolerated very well -Heather Olson appears stable. She tolerated last cycle well with no significant toxicities.  -she is responding well. CBC is normal, CMP stable -She does not need transfusions -She will return to [facility] in 2 weeks for cycle 5, she will returns for lab and f/u with [place] in 4 weeks  2.Anemiafrom MDS and chemo -Chronic, worsenedaroundMDS diagnosis -She received blood  transfusions as neededif Hg<8.0.On 7/28, 7/31 -S/p on Aranesp on 12/23/18-02/03/19 -Hgb improved lately, normal today 12.7, no need for transfusion  3. Thrombocytopenia, secondary to MDSand chemo -She has been treated with platelet infusions 7/17/-02/16/19 -no need for recent transfusions -PLT normal today  4. Hearing impaired, HA  -She has cochlear implant in right eat and hearing aid in left ear.   5. Polymyositis  -She waspreviouslyon Imuran  -She is on Prednisone, does not take Tramadol  -Managed by RA Dr. Sibyl Parr  6. HTN, GERD -On HCTZ and losartan  -On Prilosec for GERD -BP 149/68 today   PLAN: -Labs reviewed -No transfusions -Cycle 5 Decitabine at [facility] to start in 2 weeks -Lab, f/u with Dr. Burr Medico in 4 weeks   All questions were answered. The patient knows to call the clinic with any problems, questions or concerns. No barriers to learning was detected.     Alla Feeling, NP 05/24/19

## 2019-05-24 ENCOUNTER — Inpatient Hospital Stay: Payer: Medicare Other

## 2019-05-24 ENCOUNTER — Inpatient Hospital Stay (HOSPITAL_BASED_OUTPATIENT_CLINIC_OR_DEPARTMENT_OTHER): Payer: Medicare Other | Admitting: Nurse Practitioner

## 2019-05-24 ENCOUNTER — Encounter: Payer: Self-pay | Admitting: Nurse Practitioner

## 2019-05-24 ENCOUNTER — Other Ambulatory Visit: Payer: Self-pay

## 2019-05-24 VITALS — BP 149/68 | HR 83 | Temp 98.0°F | Resp 17 | Ht 64.0 in | Wt 192.9 lb

## 2019-05-24 DIAGNOSIS — Z95828 Presence of other vascular implants and grafts: Secondary | ICD-10-CM

## 2019-05-24 DIAGNOSIS — D469 Myelodysplastic syndrome, unspecified: Secondary | ICD-10-CM | POA: Diagnosis not present

## 2019-05-24 DIAGNOSIS — D46Z Other myelodysplastic syndromes: Secondary | ICD-10-CM | POA: Diagnosis not present

## 2019-05-24 DIAGNOSIS — Z452 Encounter for adjustment and management of vascular access device: Secondary | ICD-10-CM | POA: Diagnosis not present

## 2019-05-24 LAB — CMP (CANCER CENTER ONLY)
ALT: 21 U/L (ref 0–44)
AST: 26 U/L (ref 15–41)
Albumin: 3.5 g/dL (ref 3.5–5.0)
Alkaline Phosphatase: 69 U/L (ref 38–126)
Anion gap: 11 (ref 5–15)
BUN: 24 mg/dL — ABNORMAL HIGH (ref 8–23)
CO2: 22 mmol/L (ref 22–32)
Calcium: 8.6 mg/dL — ABNORMAL LOW (ref 8.9–10.3)
Chloride: 106 mmol/L (ref 98–111)
Creatinine: 1.2 mg/dL — ABNORMAL HIGH (ref 0.44–1.00)
GFR, Est AFR Am: 48 mL/min — ABNORMAL LOW (ref >60)
GFR, Estimated: 42 mL/min — ABNORMAL LOW (ref >60)
Glucose, Bld: 132 mg/dL — ABNORMAL HIGH (ref 70–99)
Potassium: 4.4 mmol/L (ref 3.5–5.1)
Sodium: 139 mmol/L (ref 135–145)
Total Bilirubin: 0.5 mg/dL (ref 0.3–1.2)
Total Protein: 6.5 g/dL (ref 6.5–8.1)

## 2019-05-24 LAB — CBC WITH DIFFERENTIAL (CANCER CENTER ONLY)
Abs Immature Granulocytes: 0.11 10*3/uL — ABNORMAL HIGH (ref 0.00–0.07)
Basophils Absolute: 0 10*3/uL (ref 0.0–0.1)
Basophils Relative: 1 %
Eosinophils Absolute: 0.1 10*3/uL (ref 0.0–0.5)
Eosinophils Relative: 1 %
HCT: 37.1 % (ref 36.0–46.0)
Hemoglobin: 12.7 g/dL (ref 12.0–15.0)
Immature Granulocytes: 2 %
Lymphocytes Relative: 15 %
Lymphs Abs: 0.7 10*3/uL (ref 0.7–4.0)
MCH: 31.1 pg (ref 26.0–34.0)
MCHC: 34.2 g/dL (ref 30.0–36.0)
MCV: 90.9 fL (ref 80.0–100.0)
Monocytes Absolute: 0.1 10*3/uL (ref 0.1–1.0)
Monocytes Relative: 2 %
Neutro Abs: 3.7 10*3/uL (ref 1.7–7.7)
Neutrophils Relative %: 79 %
Platelet Count: 166 10*3/uL (ref 150–400)
RBC: 4.08 MIL/uL (ref 3.87–5.11)
RDW: 14.7 % (ref 11.5–15.5)
WBC Count: 4.7 10*3/uL (ref 4.0–10.5)
nRBC: 0 % (ref 0.0–0.2)

## 2019-05-24 MED ORDER — ALTEPLASE 2 MG IJ SOLR
INTRAMUSCULAR | Status: AC
  Filled 2019-05-24: qty 2

## 2019-05-24 MED ORDER — HEPARIN SOD (PORK) LOCK FLUSH 100 UNIT/ML IV SOLN
500.0000 [IU] | Freq: Once | INTRAVENOUS | Status: DC | PRN
  Filled 2019-05-24: qty 5

## 2019-05-24 MED ORDER — ALTEPLASE 2 MG IJ SOLR
2.0000 mg | Freq: Once | INTRAMUSCULAR | Status: AC | PRN
  Administered 2019-05-24: 15:00:00 2 mg
  Filled 2019-05-24: qty 2

## 2019-05-24 MED ORDER — SODIUM CHLORIDE 0.9% FLUSH
10.0000 mL | INTRAVENOUS | Status: DC | PRN
  Administered 2019-05-24: 15:00:00 10 mL
  Filled 2019-05-24: qty 10

## 2019-05-24 NOTE — Progress Notes (Signed)
No blood return from [facility].  Per L. Kalman Shan,  NP cathflow administered at 1513.  Pt stuck twice peripherally.  Only able to draw enough blood to run CBC.  Hgb 12.7, no BB necessary to draw.  CMET tube given to L. Bunton, LPN to draw when cathflow works on [facility].

## 2019-05-24 NOTE — Patient Instructions (Signed)

## 2019-05-25 ENCOUNTER — Telehealth: Payer: Self-pay | Admitting: Nurse Practitioner

## 2019-05-25 NOTE — Telephone Encounter (Signed)
Scheduled appt per 10/26 los.  Sent a staff message to get a calendar mailed out.

## 2019-05-29 ENCOUNTER — Emergency Department (HOSPITAL_COMMUNITY): Payer: Medicare Other

## 2019-05-29 ENCOUNTER — Other Ambulatory Visit: Payer: Self-pay

## 2019-05-29 ENCOUNTER — Emergency Department (HOSPITAL_COMMUNITY)
Admission: EM | Admit: 2019-05-29 | Discharge: 2019-05-29 | Disposition: A | Discharge status: Home / Self Care | Payer: Medicare Other | Attending: Emergency Medicine | Admitting: Emergency Medicine

## 2019-05-29 ENCOUNTER — Encounter (HOSPITAL_COMMUNITY): Payer: Self-pay | Admitting: Emergency Medicine

## 2019-05-29 DIAGNOSIS — D649 Anemia, unspecified: Secondary | ICD-10-CM | POA: Diagnosis not present

## 2019-05-29 DIAGNOSIS — Z79899 Other long term (current) drug therapy: Secondary | ICD-10-CM | POA: Diagnosis not present

## 2019-05-29 DIAGNOSIS — R519 Headache, unspecified: Secondary | ICD-10-CM | POA: Diagnosis not present

## 2019-05-29 DIAGNOSIS — R42 Dizziness and giddiness: Secondary | ICD-10-CM | POA: Diagnosis not present

## 2019-05-29 DIAGNOSIS — R55 Syncope and collapse: Secondary | ICD-10-CM | POA: Diagnosis not present

## 2019-05-29 DIAGNOSIS — D469 Myelodysplastic syndrome, unspecified: Secondary | ICD-10-CM | POA: Diagnosis not present

## 2019-05-29 DIAGNOSIS — R0602 Shortness of breath: Secondary | ICD-10-CM | POA: Diagnosis not present

## 2019-05-29 DIAGNOSIS — I10 Essential (primary) hypertension: Secondary | ICD-10-CM | POA: Insufficient documentation

## 2019-05-29 DIAGNOSIS — I959 Hypotension, unspecified: Secondary | ICD-10-CM | POA: Diagnosis not present

## 2019-05-29 DIAGNOSIS — R29810 Facial weakness: Secondary | ICD-10-CM | POA: Diagnosis not present

## 2019-05-29 DIAGNOSIS — R5383 Other fatigue: Secondary | ICD-10-CM | POA: Diagnosis not present

## 2019-05-29 DIAGNOSIS — R531 Weakness: Secondary | ICD-10-CM | POA: Diagnosis not present

## 2019-05-29 DIAGNOSIS — D696 Thrombocytopenia, unspecified: Secondary | ICD-10-CM | POA: Diagnosis not present

## 2019-05-29 LAB — BASIC METABOLIC PANEL
Anion gap: 6 (ref 5–15)
BUN: 27 mg/dL — ABNORMAL HIGH (ref 8–23)
CO2: 25 mmol/L (ref 22–32)
Calcium: 8.5 mg/dL — ABNORMAL LOW (ref 8.9–10.3)
Chloride: 108 mmol/L (ref 98–111)
Creatinine, Ser: 1.21 mg/dL — ABNORMAL HIGH (ref 0.44–1.00)
GFR calc Af Amer: 48 mL/min — ABNORMAL LOW (ref >60)
GFR calc non Af Amer: 41 mL/min — ABNORMAL LOW (ref >60)
Glucose, Bld: 104 mg/dL — ABNORMAL HIGH (ref 70–99)
Potassium: 4.3 mmol/L (ref 3.5–5.1)
Sodium: 139 mmol/L (ref 135–145)

## 2019-05-29 LAB — CBC WITH DIFFERENTIAL/PLATELET
Band Neutrophils: 0 %
Basophils Absolute: 0 10*3/uL (ref 0.0–0.1)
Basophils Relative: 0 %
Eosinophils Absolute: 0 10*3/uL (ref 0.0–0.5)
Eosinophils Relative: 0 %
HCT: 31.6 % — ABNORMAL LOW (ref 36.0–46.0)
Hemoglobin: 10.4 g/dL — ABNORMAL LOW (ref 12.0–15.0)
Lymphocytes Relative: 55 %
Lymphs Abs: 0.7 10*3/uL (ref 0.7–4.0)
MCH: 30.2 pg (ref 26.0–34.0)
MCHC: 32.9 g/dL (ref 30.0–36.0)
MCV: 91.9 fL (ref 80.0–100.0)
Monocytes Absolute: 0.1 10*3/uL (ref 0.1–1.0)
Monocytes Relative: 6 %
Neutro Abs: 0.5 10*3/uL — ABNORMAL LOW (ref 1.7–7.7)
Neutrophils Relative %: 37 %
Platelets: 91 10*3/uL — ABNORMAL LOW (ref 150–400)
RBC: 3.44 MIL/uL — ABNORMAL LOW (ref 3.87–5.11)
RDW: 14.6 % (ref 11.5–15.5)
WBC Morphology: >10
WBC: 1.3 10*3/uL — CL (ref 4.0–10.5)
nRBC: 0 % (ref 0.0–0.2)

## 2019-05-29 LAB — TYPE AND SCREEN
ABO/RH(D): A POS
Antibody Screen: NEGATIVE

## 2019-05-29 LAB — ABO/RH: ABO/RH(D): A POS

## 2019-05-29 LAB — PROTIME-INR
INR: 1.1 (ref 0.8–1.2)
Prothrombin Time: 13.9 seconds (ref 11.4–15.2)

## 2019-05-29 MED ORDER — HEPARIN SOD (PORK) LOCK FLUSH 100 UNIT/ML IV SOLN
500.0000 [IU] | Freq: Once | INTRAVENOUS | Status: AC
  Administered 2019-05-29: 500 [IU]
  Filled 2019-05-29: qty 5

## 2019-05-29 NOTE — Discharge Instructions (Addendum)
You were seen in the emergency department for an episode of syncope.  You had blood work checked that showed your white blood cells red blood cells and platelets are all lower although not in a critical range yet.  Please contact your oncologist on Monday for close follow-up.  If you experience worsening symptoms return to the emergency department.  Also return if you notice any obvious bleeding.

## 2019-05-29 NOTE — ED Notes (Signed)
Patient given ice water 

## 2019-05-29 NOTE — ED Provider Notes (Signed)
[facility] [facility] DEPT Provider Note   CSN: [id number] Arrival date & time: 05/29/19  1314     History   Chief Complaint Chief Complaint  Patient presents with  . Shortness of Breath  . Loss of Consciousness    HPI Heather Olson is a 85 y.o. female.  She is presenting from home after she thinks she had a syncopal event.  She said she had a little bit of a headache and then felt very dizzy.  She went and use the bathroom and still felt lightheaded so lay down in bed.  She feels back to baseline now.  She said she felt like this before when she is needed blood or platelets.  She denies any recent illness.  She think she has been maybe a little more short of breath.  No fever or cough no chest pain abdominal pain.  She has not noticed any bleeding from above or below.     The history is provided by the patient.  Loss of Consciousness Episode history:  Unable to specify Most recent episode:  Today Timing:  Intermittent Progression:  Resolved Chronicity:  Recurrent Context: bowel movement and standing up   Witnessed: no   Relieved by:  Bed rest Worsened by:  Nothing Ineffective treatments:  None tried Associated symptoms: dizziness, headaches, malaise/fatigue and shortness of breath   Associated symptoms: no chest pain, no diaphoresis, no difficulty breathing, no fever, no focal weakness, no nausea, no recent surgery, no rectal bleeding, no visual change and no vomiting     Past Medical History:  Diagnosis Date  . Arthritis   . GERD (gastroesophageal reflux disease)   . Headache(784.0)   . Hypertension   . Polymyositis ([place])   . Swelling of both ankles     Patient Active Problem List   Diagnosis Date Noted  . Port-A-Cath in place 02/22/2019  . MDS (myelodysplastic syndrome), high grade ([place]) 02/18/2019  . Postop check 06/15/2013  . Cholelithiases 05/11/2013    Past Surgical History:  Procedure Laterality Date  . ABDOMINAL HYSTERECTOMY    .  APPENDECTOMY    . CHOLECYSTECTOMY N/A 05/28/2013   Procedure: LAPAROSCOPIC CHOLECYSTECTOMY ;  Surgeon: Gwenyth Ober, MD;  Location: [facility];  Service: General;  Laterality: N/A;  . TONSILLECTOMY       OB History   No obstetric history on file.      Home Medications    Prior to Admission medications   Medication Sig Start Date End Date Taking? Authorizing Provider  acetaminophen (TYLENOL) 650 MG CR tablet Take 650 mg by mouth every 8 (eight) hours as needed for pain.    [provider]  acyclovir (ZOVIRAX) 200 MG capsule  04/19/19   [provider]  Decitabine-Cedazuridine 35-100 MG TABS Take by mouth. 05/12/19   [provider]  divalproex (DEPAKOTE) 250 MG DR tablet Take 250-500 mg by mouth See admin instructions. Take 2 tablets (500 mg) by mouth every morning and 1 tablet (250 mg) at night - For occipital neuralgia 03/13/18 03/13/19  [provider]  FLUZONE HIGH-DOSE QUADRIVALENT 0.7 ML SUSY ADM 0.7ML IM UTD 03/25/19   [provider]  hydrochlorothiazide (HYDRODIURIL) 25 MG tablet Take 25 mg by mouth daily.    [provider]  losartan (COZAAR) 50 MG tablet Take 50 mg by mouth daily.    [provider]  Multiple Vitamin (MULTIVITAMIN WITH MINERALS) TABS tablet Take 1 tablet by mouth daily.    [provider]  OMEGA-3 FATTY ACIDS PO Take by mouth.    [provider]  omeprazole (PRILOSEC) 40 MG capsule Take 40 mg by mouth daily.     [provider]  predniSONE (DELTASONE) 5 MG tablet Take 5 mg by mouth daily.    [provider]  traMADol (ULTRAM) 50 MG tablet Take 0.5 tablets (25 mg total) by mouth every 6 (six) hours as needed for severe pain. 12/13/17   Tereasa Coop, PA-C    Family History Family History  Problem Relation Age of Onset  . Diabetes Mother   . Cancer Daughter 42       ovarian cancer     Social History Social History   Tobacco Use  . Smoking status: Never Smoker   . Smokeless tobacco: Never Used  Substance Use Topics  . Alcohol use: No  . Drug use: No     Allergies   Butalbital-apap-caffeine and Lisinopril   Review of Systems Review of Systems  Constitutional: Positive for malaise/fatigue. Negative for diaphoresis and fever.  HENT: Negative for sore throat.   Eyes: Negative for visual disturbance.  Respiratory: Positive for shortness of breath.   Cardiovascular: Positive for syncope. Negative for chest pain.  Gastrointestinal: Negative for abdominal pain, nausea and vomiting.  Genitourinary: Negative for dysuria.  Musculoskeletal: Negative for neck pain.  Skin: Negative for rash.  Neurological: Positive for dizziness and headaches. Negative for focal weakness.     Physical Exam Updated Vital Signs BP (!) 146/61   Pulse 80   Temp 98.1 F (36.7 C) (Oral)   Resp 18   Ht 5\' 4"  (1.626 m)   Wt 86.2 kg   SpO2 99%   BMI 32.61 kg/m   Physical Exam Vitals signs and nursing note reviewed.  Constitutional:      General: She is not in acute distress.    Appearance: She is well-developed.  HENT:     Head: Normocephalic and atraumatic.  Eyes:     Conjunctiva/sclera: Conjunctivae normal.  Neck:     Musculoskeletal: Neck supple.  Cardiovascular:     Rate and Rhythm: Normal rate and regular rhythm.     Heart sounds: No murmur.  Pulmonary:     Effort: Pulmonary effort is normal. No respiratory distress.     Breath sounds: Normal breath sounds.  Abdominal:     Palpations: Abdomen is soft.     Tenderness: There is no abdominal tenderness.  Musculoskeletal: Normal range of motion.     Right lower leg: No edema.     Left lower leg: No edema.  Skin:    General: Skin is warm and dry.     Capillary Refill: Capillary refill takes less than 2 seconds.  Neurological:     General: No focal deficit present.     Mental Status: She is alert.     Sensory: No sensory deficit.     Motor: No weakness.      ED Treatments / Results  Labs  (all labs ordered are listed, but only abnormal results are displayed) Labs Reviewed  BASIC METABOLIC PANEL - Abnormal; Notable for the following components:      Result Value   Glucose, Bld 104 (*)    BUN 27 (*)    Creatinine, Ser 1.21 (*)    Calcium 8.5 (*)    GFR calc non Af Amer 41 (*)    GFR calc Af Amer 48 (*)    All other components within normal limits  CBC WITH DIFFERENTIAL/PLATELET -  Abnormal; Notable for the following components:   WBC 1.3 (*)    RBC 3.44 (*)    Hemoglobin 10.4 (*)    HCT 31.6 (*)    Platelets 91 (*)    Neutro Abs 0.5 (*)    All other components within normal limits  PROTIME-INR  TYPE AND SCREEN  ABO/RH    EKG None  Radiology Dg Chest Port 1 View  Result Date: 05/29/2019 CLINICAL DATA:  Shortness of breath EXAM: PORTABLE CHEST 1 VIEW COMPARISON:  Portable exam 1558 hours compared to 04/28/2019 FINDINGS: RIGHT jugular Port-A-Cath with tip projecting over high RIGHT atrium. Upper normal heart size. Mediastinal contours and pulmonary vascularity normal. Lungs hypoinflated with mild bibasilar atelectasis. No infiltrate, pleural effusion or pneumothorax. IMPRESSION: Mild bibasilar atelectasis. Electronically Signed   By: Lavonia Dana M.D.   On: 05/29/2019 16:05    Procedures Procedures (including critical care time)  Medications Ordered in ED Medications - No data to display   Initial Impression / Assessment and Plan / ED Course  I have reviewed the triage vital signs and the nursing notes.  Pertinent labs & imaging results that were available during my care of the patient were reviewed by me and considered in my medical decision making (see chart for details).  Clinical Course as of May 30 927  Sat May 28, 1854  [id number] 85 year old female here with possible syncopal event.  Differential includes syncope, arrhythmia, hypovolemia, metabolic derangement.  She is awake and alert with a nonfocal exam now.  She says she feels back to baseline.  Vital  signs unremarkable.  She feels this may be related to anemia as she is required blood transfusions in the past.  We will check some blood counts EKG   [MB]  1348 ECG is sinus rhythm rate of 81 nonspecific ST-T changes.   [MB]  [id number] Chest x-ray interpreted by me as no acute infiltrates.  Awaiting radiology reading.   [MB]  [id number] Patient's labs resulting back and showing her white count to be low at 1.3 and her hemoglobin lower at 10.4.  Platelets are also decreased at 91.  I do not think any of these values require her to be admitted and transfused at this time if she has no active bleeding.   [MB]  1705 On the review of patient's notes from oncology at [facility] they commented upon a fairly extensive syncope work-up in the past.   [MB]  1721 Patient's orthostatics were good here.  She is comfortable with discharge and understands she needs to contact her oncologist regarding her lab work and symptoms.  She understands to return if any worsening symptoms.   [MB]    Clinical Course User Index [MB] Hayden Rasmussen, MD        Final Clinical Impressions(s) / ED Diagnoses   Final diagnoses:  Syncope and collapse  Anemia, unspecified type  Thrombocytopenia ([place])  Myelodysplasia (myelodysplastic syndrome) [facility])    ED Discharge Orders    None       Hayden Rasmussen, MD 05/30/19 [phone]

## 2019-05-29 NOTE — ED Triage Notes (Signed)
Patient is from home where 2 hours ago she had a syncopal episode after using the restroom. She reports similar episodes in the past when she is anemic. She had a blood transfusion 2 months ago. She also complains of exertional shortness of breath. Currently the patient takes oral chemo for Leukemia.   EMS vitals: 88 HR 99% O2 sat room air 146/70 BP 133 CBG 98.1 Temp  16 Resp Rate

## 2019-05-29 NOTE — ED Notes (Signed)
Date and time results received: 05/29/19 1646 (use smartphrase ".now" to insert current time)  Test: WBC Critical Value: 1.3  Name of Provider Notified: Dr. Melina Copa  Orders Received? Or Actions Taken?: Orders Received - See Orders for details

## 2019-06-09 DIAGNOSIS — D469 Myelodysplastic syndrome, unspecified: Secondary | ICD-10-CM | POA: Diagnosis not present

## 2019-06-09 DIAGNOSIS — K5900 Constipation, unspecified: Secondary | ICD-10-CM | POA: Diagnosis not present

## 2019-06-17 ENCOUNTER — Inpatient Hospital Stay: Payer: Medicare Other | Attending: Hematology | Admitting: Medical

## 2019-06-17 ENCOUNTER — Other Ambulatory Visit: Payer: Self-pay

## 2019-06-17 ENCOUNTER — Inpatient Hospital Stay: Payer: Medicare Other

## 2019-06-17 VITALS — BP 108/83 | HR 87 | Temp 98.9°F | Resp 20 | Ht 64.0 in | Wt 193.3 lb

## 2019-06-17 DIAGNOSIS — D46Z Other myelodysplastic syndromes: Secondary | ICD-10-CM

## 2019-06-17 DIAGNOSIS — R5383 Other fatigue: Secondary | ICD-10-CM | POA: Diagnosis not present

## 2019-06-17 DIAGNOSIS — D469 Myelodysplastic syndrome, unspecified: Secondary | ICD-10-CM | POA: Insufficient documentation

## 2019-06-17 DIAGNOSIS — Z95828 Presence of other vascular implants and grafts: Secondary | ICD-10-CM

## 2019-06-17 DIAGNOSIS — D6481 Anemia due to antineoplastic chemotherapy: Secondary | ICD-10-CM | POA: Insufficient documentation

## 2019-06-17 DIAGNOSIS — R0609 Other forms of dyspnea: Secondary | ICD-10-CM | POA: Diagnosis not present

## 2019-06-17 DIAGNOSIS — M3320 Polymyositis, organ involvement unspecified: Secondary | ICD-10-CM | POA: Diagnosis not present

## 2019-06-17 DIAGNOSIS — H9191 Unspecified hearing loss, right ear: Secondary | ICD-10-CM | POA: Diagnosis not present

## 2019-06-17 DIAGNOSIS — D638 Anemia in other chronic diseases classified elsewhere: Secondary | ICD-10-CM | POA: Insufficient documentation

## 2019-06-17 DIAGNOSIS — I10 Essential (primary) hypertension: Secondary | ICD-10-CM | POA: Diagnosis not present

## 2019-06-17 DIAGNOSIS — T451X5A Adverse effect of antineoplastic and immunosuppressive drugs, initial encounter: Secondary | ICD-10-CM | POA: Diagnosis not present

## 2019-06-17 DIAGNOSIS — K219 Gastro-esophageal reflux disease without esophagitis: Secondary | ICD-10-CM | POA: Diagnosis not present

## 2019-06-17 LAB — CMP (CANCER CENTER ONLY)
ALT: 20 U/L (ref 0–44)
AST: 23 U/L (ref 15–41)
Albumin: 3.2 g/dL — ABNORMAL LOW (ref 3.5–5.0)
Alkaline Phosphatase: 60 U/L (ref 38–126)
Anion gap: 11 (ref 5–15)
BUN: 25 mg/dL — ABNORMAL HIGH (ref 8–23)
CO2: 22 mmol/L (ref 22–32)
Calcium: 8.3 mg/dL — ABNORMAL LOW (ref 8.9–10.3)
Chloride: 106 mmol/L (ref 98–111)
Creatinine: 1.31 mg/dL — ABNORMAL HIGH (ref 0.44–1.00)
GFR, Est AFR Am: 44 mL/min — ABNORMAL LOW (ref >60)
GFR, Estimated: 38 mL/min — ABNORMAL LOW (ref >60)
Glucose, Bld: 149 mg/dL — ABNORMAL HIGH (ref 70–99)
Potassium: 3.9 mmol/L (ref 3.5–5.1)
Sodium: 139 mmol/L (ref 135–145)
Total Bilirubin: 0.6 mg/dL (ref 0.3–1.2)
Total Protein: 6.6 g/dL (ref 6.5–8.1)

## 2019-06-17 LAB — CBC WITH DIFFERENTIAL (CANCER CENTER ONLY)
Abs Immature Granulocytes: 0.12 10*3/uL — ABNORMAL HIGH (ref 0.00–0.07)
Basophils Absolute: 0 10*3/uL (ref 0.0–0.1)
Basophils Relative: 0 %
Eosinophils Absolute: 0 10*3/uL (ref 0.0–0.5)
Eosinophils Relative: 0 %
HCT: 26.3 % — ABNORMAL LOW (ref 36.0–46.0)
Hemoglobin: 9.1 g/dL — ABNORMAL LOW (ref 12.0–15.0)
Immature Granulocytes: 4 %
Lymphocytes Relative: 19 %
Lymphs Abs: 0.6 10*3/uL — ABNORMAL LOW (ref 0.7–4.0)
MCH: 29.8 pg (ref 26.0–34.0)
MCHC: 34.6 g/dL (ref 30.0–36.0)
MCV: 86.2 fL (ref 80.0–100.0)
Monocytes Absolute: 0.1 10*3/uL (ref 0.1–1.0)
Monocytes Relative: 3 %
Neutro Abs: 2.2 10*3/uL (ref 1.7–7.7)
Neutrophils Relative %: 74 %
Platelet Count: 163 10*3/uL (ref 150–400)
RBC: 3.05 MIL/uL — ABNORMAL LOW (ref 3.87–5.11)
RDW: 14.5 % (ref 11.5–15.5)
WBC Count: 3 10*3/uL — ABNORMAL LOW (ref 4.0–10.5)
nRBC: 0 % (ref 0.0–0.2)

## 2019-06-17 LAB — SAMPLE TO BLOOD BANK

## 2019-06-17 MED ORDER — SODIUM CHLORIDE 0.9% FLUSH
10.0000 mL | INTRAVENOUS | Status: DC | PRN
  Administered 2019-06-17: 10 mL
  Filled 2019-06-17: qty 10

## 2019-06-17 MED ORDER — HEPARIN SOD (PORK) LOCK FLUSH 100 UNIT/ML IV SOLN
500.0000 [IU] | Freq: Once | INTRAVENOUS | Status: AC | PRN
  Administered 2019-06-17: 500 [IU]
  Filled 2019-06-17: qty 5

## 2019-06-17 NOTE — Patient Instructions (Signed)
COVID-19: How to Protect Yourself and Others Know how it spreads  There is currently no vaccine to prevent coronavirus disease 2019 (COVID-19).  The best way to prevent illness is to avoid being exposed to this virus.  The virus is thought to spread mainly from person-to-person. ? Between people who are in close contact with one another (within about 6 feet). ? Through respiratory droplets produced when an infected person coughs, sneezes or talks. ? These droplets can land in the mouths or noses of people who are nearby or possibly be inhaled into the lungs. ? Some recent studies have suggested that COVID-19 may be spread by people who are not showing symptoms. Everyone should Clean your hands often  Wash your hands often with soap and water for at least 20 seconds especially after you have been in a public place, or after blowing your nose, coughing, or sneezing.  If soap and water are not readily available, use a hand sanitizer that contains at least 60% alcohol. Cover all surfaces of your hands and rub them together until they feel dry.  Avoid touching your eyes, nose, and mouth with unwashed hands. Avoid close contact  Stay home if you are sick.  Avoid close contact with people who are sick.  Put distance between yourself and other people. ? Remember that some people without symptoms may be able to spread virus. ? This is especially important for people who are at higher risk of getting very sick.www.cdc.gov/coronavirus/2019-ncov/need-extra-precautions/people-at-higher-risk.html Cover your mouth and nose with a cloth face cover when around others  You could spread COVID-19 to others even if you do not feel sick.  Everyone should wear a cloth face cover when they have to go out in public, for example to the grocery store or to pick up other necessities. ? Cloth face coverings should not be placed on young children under age 2, anyone who has trouble breathing, or is unconscious,  incapacitated or otherwise unable to remove the mask without assistance.  The cloth face cover is meant to protect other people in case you are infected.  Do NOT use a facemask meant for a healthcare worker.  Continue to keep about 6 feet between yourself and others. The cloth face cover is not a substitute for social distancing. Cover coughs and sneezes  If you are in a private setting and do not have on your cloth face covering, remember to always cover your mouth and nose with a tissue when you cough or sneeze or use the inside of your elbow.  Throw used tissues in the trash.  Immediately wash your hands with soap and water for at least 20 seconds. If soap and water are not readily available, clean your hands with a hand sanitizer that contains at least 60% alcohol. Clean and disinfect  Clean AND disinfect frequently touched surfaces daily. This includes tables, doorknobs, light switches, countertops, handles, desks, phones, keyboards, toilets, faucets, and sinks. www.cdc.gov/coronavirus/2019-ncov/prevent-getting-sick/disinfecting-your-home.html  If surfaces are dirty, clean them: Use detergent or soap and water prior to disinfection.  Then, use a household disinfectant. You can see a list of EPA-registered household disinfectants here. cdc.gov/coronavirus 12/01/2018 This information is not intended to replace advice given to you by your health care provider. Make sure you discuss any questions you have with your health care provider. Document Released: 11/10/2018 Document Revised: 12/09/2018 Document Reviewed: 11/10/2018 Elsevier Patient Education  2020 [employer].  

## 2019-06-17 NOTE — Progress Notes (Signed)
Patient's son Heather Olson calls stating that he is worried about his mom, she is extremely fatigued for past two days and SOB, instructed him to bring her on in to [facility] to have lab work and to be seen by Sandi Mealy PA.  Orders were entered and a high priority schedule message was sent.
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
## 2019-06-17 NOTE — Progress Notes (Signed)
Symptoms Management Clinic Progress Note   Elvenia Squyres [id number] 21-Nov-1932 85 y.o.  [name] is managed by Dr. Truitt Merle  Actively treated with chemotherapy/immunotherapy/hormonal therapy: yes  Current therapy: Decitabine-Cedazuridine  Last treated: Began: 05/12/2019  Next scheduled appointment with provider: 06/21/2019  Assessment: Plan:    Port-A-Cath in place - Plan: heparin lock flush 100 unit/mL, sodium chloride flush (NS) 0.9 % injection 10 mL  Other form of dyspnea  Other fatigue  MDS (myelodysplastic syndrome), high grade (HCC)   Dyspnea on exertion and fatigue: The patient's labs returned today showing a hemoglobin of 9.1 and a hematocrit of 26.3.  There was only a slight elevation of the patient's creatinine and BUN.  Overall her labs were stable.  We discussed that her mild dyspnea on exertion and fatigue could certainly be related to a slight decrease in her hemoglobin and hematocrit.  We also discussed that these could be related to a subacute viral infection.  She expressed understanding.  Plans were to have her return as scheduled on 06/21/2019.  MDS: Heather Olson continues to be followed by Dr. Truitt Merle and has completed her most recent cycle of  Decitabine-Cedazuridine which started on 05/12/2019.  She will return to see Dr. Burr Medico in follow-up on 06/21/2019.  Please see After Visit Summary for patient specific instructions.  Future Appointments  Date Time Provider [place]  06/21/2019  1:15 PM [facility] LAB 5 [facility] None  06/21/2019  1:30 PM [facility] [place] FLUSH [facility] None  06/21/2019  2:00 PM Truitt Merle, MD [facility] None    No orders of the defined types were placed in this encounter.      Subjective:   Patient ID:  Heather Olson is a 85 y.o. (DOB 01/24/1933) female.  Chief Complaint:  Chief Complaint  Patient presents with  . Fatigue    HPI Heather Olson  Is a 85 y.o. female with a diagnosis of MDS. [name] is  managed by Dr. Truitt Merle and has completed her most recent cycle of  Decitabine-Cedazuridine which started on 05/12/2019.  She is scheduled to see Dr. Burr Medico in follow-up on 06/21/2019.  She presents to the clinic today with a report of mild shortness of breath, dyspnea on exertion and fatigue of recent onset.  She was concerned that she was anemic.  Her labs today were stable and did not show evidence of anemia.  She denies fevers, chills, sweats, nausea, vomiting, diarrhea, chest pain, or positional dizziness.  She states that she is eating and drinking well.   Medications: I have reviewed the patient's current medications.  Allergies:  Allergies  Allergen Reactions  . Butalbital-Apap-Caffeine Itching    Patient reports itching after taking x 1 tablet, so she stopped taking medication  . Lisinopril Cough    Past Medical History:  Diagnosis Date  . Arthritis   . GERD (gastroesophageal reflux disease)   . Headache(784.0)   . Hypertension   . Polymyositis ([place])   . Swelling of both ankles     Past Surgical History:  Procedure Laterality Date  . ABDOMINAL HYSTERECTOMY    . APPENDECTOMY    . CHOLECYSTECTOMY N/A 05/28/2013   Procedure: LAPAROSCOPIC CHOLECYSTECTOMY ;  Surgeon: Gwenyth Ober, MD;  Location: [facility] OR;  Service: General;  Laterality: N/A;  . TONSILLECTOMY      Family History  Problem Relation Age of Onset  . Diabetes Mother   . Cancer Daughter 36       ovarian cancer  Social History   Socioeconomic History  . Marital status: Widowed    Spouse name: Not on file  . Number of children: 3  . Years of education: Not on file  . Highest education level: Not on file  Occupational History  . Not on file  Social Needs  . Financial resource strain: Not on file  . Food insecurity    Worry: Not on file    Inability: Not on file  . Transportation needs    Medical: Not on file    Non-medical: Not on file  Tobacco Use  . Smoking status: Never Smoker  . Smokeless  tobacco: Never Used  Substance and Sexual Activity  . Alcohol use: No  . Drug use: No  . Sexual activity: Not on file  Lifestyle  . Physical activity    Days per week: Not on file    Minutes per session: Not on file  . Stress: Not on file  Relationships  . Social Herbalist on phone: Not on file    Gets together: Not on file    Attends religious service: Not on file    Active member of club or organization: Not on file    Attends meetings of clubs or organizations: Not on file    Relationship status: Not on file  . Intimate partner violence    Fear of current or ex partner: Not on file    Emotionally abused: Not on file    Physically abused: Not on file    Forced sexual activity: Not on file  Other Topics Concern  . Not on file  Social History Narrative  . Not on file    Past Medical History, Surgical history, Social history, and Family history were reviewed and updated as appropriate.   Please see review of systems for further details on the patient's review from today.   Review of Systems:  Review of Systems  Constitutional: Positive for fatigue. Negative for chills, diaphoresis and fever.  HENT: Negative for trouble swallowing.   Respiratory: Positive for shortness of breath. Negative for cough, choking, chest tightness, wheezing and stridor.        Mild dyspnea on exertion  Cardiovascular: Negative for chest pain and palpitations.    Objective:   Physical Exam:  BP 108/83 (BP Location: Left Arm, Patient Position: Sitting)   Pulse 87   Temp 98.9 F (37.2 C)   Resp 20   Ht 5\' 4"  (1.626 m)   Wt 193 lb 4.8 oz (87.7 kg)   SpO2 100%   BMI 33.18 kg/m  ECOG: 1  Orthostatic Blood Pressure: Blood pressure:   sitting 128/58, standing 131/69 Pulse:   sitting 79, standing 93  Physical Exam Constitutional:      General: She is not in acute distress.    Appearance: She is not diaphoretic.  HENT:     Head: Normocephalic and atraumatic.  Eyes:      General: No scleral icterus.       Right eye: No discharge.        Left eye: No discharge.     Conjunctiva/sclera: Conjunctivae normal.  Cardiovascular:     Rate and Rhythm: Normal rate and regular rhythm.     Heart sounds: Normal heart sounds. No murmur. No friction rub. No gallop.   Pulmonary:     Effort: Pulmonary effort is normal. No respiratory distress.     Breath sounds: Normal breath sounds. No stridor. No wheezing or rales.  Skin:  General: Skin is warm and dry.     Coloration: Skin is not pale.     Findings: No erythema.  Neurological:     Mental Status: She is alert.  Psychiatric:        Behavior: Behavior normal.        Thought Content: Thought content normal.        Judgment: Judgment normal.     Lab Review:     Component Value Date/Time   NA 139 06/17/2019 1024   K 3.9 06/17/2019 1024   CL 106 06/17/2019 1024   CO2 22 06/17/2019 1024   GLUCOSE 149 (H) 06/17/2019 1024   BUN 25 (H) 06/17/2019 1024   CREATININE 1.31 (H) 06/17/2019 1024   CALCIUM 8.3 (L) 06/17/2019 1024   PROT 6.6 06/17/2019 1024   ALBUMIN 3.2 (L) 06/17/2019 1024   AST 23 06/17/2019 1024   ALT 20 06/17/2019 1024   ALKPHOS 60 06/17/2019 1024   BILITOT 0.6 06/17/2019 1024   GFRNONAA 38 (L) 06/17/2019 1024   GFRAA 44 (L) 06/17/2019 1024       Component Value Date/Time   WBC 3.0 (L) 06/17/2019 1024   WBC 1.3 (LL) 05/29/2019 1524   RBC 3.05 (L) 06/17/2019 1024   HGB 9.1 (L) 06/17/2019 1024   HCT 26.3 (L) 06/17/2019 1024   PLT 163 06/17/2019 1024   MCV 86.2 06/17/2019 1024   MCH 29.8 06/17/2019 1024   MCHC 34.6 06/17/2019 1024   RDW 14.5 06/17/2019 1024   LYMPHSABS 0.6 (L) 06/17/2019 1024   MONOABS 0.1 06/17/2019 1024   EOSABS 0.0 06/17/2019 1024   BASOSABS 0.0 06/17/2019 1024   -------------------------------  Imaging from last 24 hours (if applicable):  Radiology interpretation: Dg Chest Port 1 View  Result Date: 05/29/2019 CLINICAL DATA:  Shortness of breath EXAM: PORTABLE  CHEST 1 VIEW COMPARISON:  Portable exam 1558 hours compared to 04/28/2019 FINDINGS: RIGHT jugular Port-A-Cath with tip projecting over high RIGHT atrium. Upper normal heart size. Mediastinal contours and pulmonary vascularity normal. Lungs hypoinflated with mild bibasilar atelectasis. No infiltrate, pleural effusion or pneumothorax. IMPRESSION: Mild bibasilar atelectasis. Electronically Signed   By: Lavonia Dana M.D.   On: 05/29/2019 16:05

## 2019-06-21 ENCOUNTER — Other Ambulatory Visit: Payer: Self-pay

## 2019-06-21 ENCOUNTER — Inpatient Hospital Stay: Payer: Medicare Other

## 2019-06-21 ENCOUNTER — Inpatient Hospital Stay (HOSPITAL_BASED_OUTPATIENT_CLINIC_OR_DEPARTMENT_OTHER): Payer: Medicare Other | Admitting: Hematology

## 2019-06-21 ENCOUNTER — Encounter: Payer: Self-pay | Admitting: Hematology

## 2019-06-21 ENCOUNTER — Other Ambulatory Visit: Payer: Self-pay | Admitting: Emergency Medicine

## 2019-06-21 VITALS — BP 147/56 | HR 85 | Temp 98.2°F | Resp 20 | Ht 64.0 in | Wt 192.5 lb

## 2019-06-21 DIAGNOSIS — D6481 Anemia due to antineoplastic chemotherapy: Secondary | ICD-10-CM | POA: Diagnosis not present

## 2019-06-21 DIAGNOSIS — D500 Iron deficiency anemia secondary to blood loss (chronic): Secondary | ICD-10-CM

## 2019-06-21 DIAGNOSIS — M3320 Polymyositis, organ involvement unspecified: Secondary | ICD-10-CM | POA: Diagnosis not present

## 2019-06-21 DIAGNOSIS — H9191 Unspecified hearing loss, right ear: Secondary | ICD-10-CM | POA: Diagnosis not present

## 2019-06-21 DIAGNOSIS — D46Z Other myelodysplastic syndromes: Secondary | ICD-10-CM

## 2019-06-21 DIAGNOSIS — Z95828 Presence of other vascular implants and grafts: Secondary | ICD-10-CM

## 2019-06-21 DIAGNOSIS — D638 Anemia in other chronic diseases classified elsewhere: Secondary | ICD-10-CM | POA: Diagnosis not present

## 2019-06-21 DIAGNOSIS — K219 Gastro-esophageal reflux disease without esophagitis: Secondary | ICD-10-CM | POA: Diagnosis not present

## 2019-06-21 DIAGNOSIS — D469 Myelodysplastic syndrome, unspecified: Secondary | ICD-10-CM | POA: Diagnosis not present

## 2019-06-21 LAB — CBC WITH DIFFERENTIAL (CANCER CENTER ONLY)
Abs Immature Granulocytes: 0.04 10*3/uL (ref 0.00–0.07)
Basophils Absolute: 0 10*3/uL (ref 0.0–0.1)
Basophils Relative: 1 %
Eosinophils Absolute: 0 10*3/uL (ref 0.0–0.5)
Eosinophils Relative: 0 %
HCT: 24.3 % — ABNORMAL LOW (ref 36.0–46.0)
Hemoglobin: 8.2 g/dL — ABNORMAL LOW (ref 12.0–15.0)
Immature Granulocytes: 1 %
Lymphocytes Relative: 16 %
Lymphs Abs: 0.5 10*3/uL — ABNORMAL LOW (ref 0.7–4.0)
MCH: 29 pg (ref 26.0–34.0)
MCHC: 33.7 g/dL (ref 30.0–36.0)
MCV: 85.9 fL (ref 80.0–100.0)
Monocytes Absolute: 0.1 10*3/uL (ref 0.1–1.0)
Monocytes Relative: 3 %
Neutro Abs: 2.5 10*3/uL (ref 1.7–7.7)
Neutrophils Relative %: 79 %
Platelet Count: 108 10*3/uL — ABNORMAL LOW (ref 150–400)
RBC: 2.83 MIL/uL — ABNORMAL LOW (ref 3.87–5.11)
RDW: 14.6 % (ref 11.5–15.5)
WBC Count: 3.2 10*3/uL — ABNORMAL LOW (ref 4.0–10.5)
WBC Morphology: 3
nRBC: 0 % (ref 0.0–0.2)

## 2019-06-21 LAB — CMP (CANCER CENTER ONLY)
ALT: 21 U/L (ref 0–44)
AST: 26 U/L (ref 15–41)
Albumin: 3.3 g/dL — ABNORMAL LOW (ref 3.5–5.0)
Alkaline Phosphatase: 63 U/L (ref 38–126)
Anion gap: 10 (ref 5–15)
BUN: 25 mg/dL — ABNORMAL HIGH (ref 8–23)
CO2: 23 mmol/L (ref 22–32)
Calcium: 8.5 mg/dL — ABNORMAL LOW (ref 8.9–10.3)
Chloride: 107 mmol/L (ref 98–111)
Creatinine: 1.29 mg/dL — ABNORMAL HIGH (ref 0.44–1.00)
GFR, Est AFR Am: 44 mL/min — ABNORMAL LOW (ref >60)
GFR, Estimated: 38 mL/min — ABNORMAL LOW (ref >60)
Glucose, Bld: 123 mg/dL — ABNORMAL HIGH (ref 70–99)
Potassium: 4.6 mmol/L (ref 3.5–5.1)
Sodium: 140 mmol/L (ref 135–145)
Total Bilirubin: 0.8 mg/dL (ref 0.3–1.2)
Total Protein: 6.5 g/dL (ref 6.5–8.1)

## 2019-06-21 LAB — SAMPLE TO BLOOD BANK

## 2019-06-21 MED ORDER — HEPARIN SOD (PORK) LOCK FLUSH 100 UNIT/ML IV SOLN
250.0000 [IU] | INTRAVENOUS | Status: DC | PRN
  Filled 2019-06-21: qty 5

## 2019-06-21 MED ORDER — SODIUM CHLORIDE 0.9% IV SOLUTION
250.0000 mL | Freq: Once | INTRAVENOUS | Status: AC
  Administered 2019-06-21: 250 mL via INTRAVENOUS
  Filled 2019-06-21: qty 250

## 2019-06-21 MED ORDER — SODIUM CHLORIDE 0.9% FLUSH
10.0000 mL | INTRAVENOUS | Status: AC | PRN
  Administered 2019-06-21: 10 mL
  Filled 2019-06-21: qty 10

## 2019-06-21 MED ORDER — HEPARIN SOD (PORK) LOCK FLUSH 100 UNIT/ML IV SOLN
500.0000 [IU] | Freq: Once | INTRAVENOUS | Status: AC
  Administered 2019-06-21: 500 [IU] via INTRAVENOUS
  Filled 2019-06-21: qty 5

## 2019-06-21 MED ORDER — DIPHENHYDRAMINE HCL 25 MG PO CAPS
ORAL_CAPSULE | ORAL | Status: AC
  Filled 2019-06-21: qty 1

## 2019-06-21 MED ORDER — SODIUM CHLORIDE 0.9% FLUSH
10.0000 mL | INTRAVENOUS | Status: DC | PRN
  Administered 2019-06-21: 10 mL
  Filled 2019-06-21: qty 10

## 2019-06-21 MED ORDER — DIPHENHYDRAMINE HCL 25 MG PO CAPS
25.0000 mg | ORAL_CAPSULE | Freq: Once | ORAL | Status: AC
  Administered 2019-06-21: 25 mg via ORAL

## 2019-06-21 MED ORDER — HEPARIN SOD (PORK) LOCK FLUSH 100 UNIT/ML IV SOLN
500.0000 [IU] | Freq: Once | INTRAVENOUS | Status: AC | PRN
  Administered 2019-06-21: 14:00:00 500 [IU]
  Filled 2019-06-21: qty 5

## 2019-06-21 NOTE — Progress Notes (Signed)
CRITICAL VALUE STICKER  CRITICAL VALUE:  Hemoglobin 8.2  RECEIVER (on-site recipient of call):  Valda Favia RN  DATE & TIME NOTIFIED: 06/21/2019  1404  MESSENGER (representative from lab): Rosann Auerbach  MD NOTIFIED: Dr. Burr Medico  TIME OF NOTIFICATION:1415  RESPONSE:  Received orders for one unit of blood

## 2019-06-21 NOTE — Patient Instructions (Signed)
Blood Transfusion, Adult, Care After This sheet gives you information about how to care for yourself after your procedure. Your doctor may also give you more specific instructions. If you have problems or questions, contact your doctor. Follow these instructions at home:   Take over-the-counter and prescription medicines only as told by your doctor.  Go back to your normal activities as told by your doctor.  Follow instructions from your doctor about how to take care of the area where an IV tube was put into your vein (insertion site). Make sure you: ? Wash your hands with soap and water before you change your bandage (dressing). If there is no soap and water, use hand sanitizer. ? Change your bandage as told by your doctor.  Check your IV insertion site every day for signs of infection. Check for: ? More redness, swelling, or pain. ? More fluid or blood. ? Warmth. ? Pus or a bad smell. Contact a doctor if:  You have more redness, swelling, or pain around the IV insertion site.  You have more fluid or blood coming from the IV insertion site.  Your IV insertion site feels warm to the touch.  You have pus or a bad smell coming from the IV insertion site.  Your pee (urine) turns pink, red, or brown.  You feel weak after doing your normal activities. Get help right away if:  You have signs of a serious allergic or body defense (immune) system reaction, including: ? Itchiness. ? Hives. ? Trouble breathing. ? Anxiety. ? Pain in your chest or lower back. ? Fever, flushing, and chills. ? Fast pulse. ? Rash. ? Watery poop (diarrhea). ? Throwing up (vomiting). ? Dark pee. ? Serious headache. ? Dizziness. ? Stiff neck. ? Yellow color in your face or the white parts of your eyes (jaundice). Summary  After a blood transfusion, return to your normal activities as told by your doctor.  Every day, check for signs of infection where the IV tube was put into your vein.  Some  signs of infection are warm skin, more redness and pain, more fluid or blood, and pus or a bad smell where the needle went in.  Contact your doctor if you feel weak or have any unusual symptoms. This information is not intended to replace advice given to you by your health care provider. Make sure you discuss any questions you have with your health care provider. Document Released: 08/05/2014 Document Revised: 11/19/2017 Document Reviewed: 03/08/2016 Elsevier Patient Education  2020 Elsevier Inc.  

## 2019-06-22 ENCOUNTER — Telehealth: Payer: Self-pay | Admitting: Hematology

## 2019-06-22 LAB — BPAM RBC
Blood Product Expiration Date: 202012152359
ISSUE DATE / TIME: 202011231525
Unit Type and Rh: 6200

## 2019-06-22 LAB — TYPE AND SCREEN
ABO/RH(D): A POS
Antibody Screen: NEGATIVE
Unit division: 0

## 2019-06-22 LAB — PATHOLOGIST SMEAR REVIEW

## 2019-06-22 LAB — PREPARE RBC (CROSSMATCH)

## 2019-06-22 NOTE — Telephone Encounter (Signed)
Scheduled appt per 11/13 los. ° °Left a vm of the appt date and time °

## 2019-06-29 ENCOUNTER — Other Ambulatory Visit: Payer: Self-pay

## 2019-06-29 ENCOUNTER — Inpatient Hospital Stay: Payer: Medicare Other

## 2019-06-29 ENCOUNTER — Other Ambulatory Visit: Payer: Self-pay | Admitting: Nurse Practitioner

## 2019-06-29 ENCOUNTER — Inpatient Hospital Stay: Payer: Medicare Other | Attending: Hematology

## 2019-06-29 ENCOUNTER — Telehealth: Payer: Self-pay | Admitting: Hematology

## 2019-06-29 VITALS — BP 150/58 | HR 71 | Temp 98.5°F | Resp 16

## 2019-06-29 DIAGNOSIS — D469 Myelodysplastic syndrome, unspecified: Secondary | ICD-10-CM | POA: Insufficient documentation

## 2019-06-29 DIAGNOSIS — Z95828 Presence of other vascular implants and grafts: Secondary | ICD-10-CM

## 2019-06-29 DIAGNOSIS — D46Z Other myelodysplastic syndromes: Secondary | ICD-10-CM

## 2019-06-29 DIAGNOSIS — D500 Iron deficiency anemia secondary to blood loss (chronic): Secondary | ICD-10-CM

## 2019-06-29 LAB — PREPARE RBC (CROSSMATCH)

## 2019-06-29 LAB — CBC WITH DIFFERENTIAL (CANCER CENTER ONLY)
Abs Immature Granulocytes: 0.02 10*3/uL (ref 0.00–0.07)
Basophils Absolute: 0 10*3/uL (ref 0.0–0.1)
Basophils Relative: 1 %
Eosinophils Absolute: 0 10*3/uL (ref 0.0–0.5)
Eosinophils Relative: 1 %
HCT: 26.6 % — ABNORMAL LOW (ref 36.0–46.0)
Hemoglobin: 9 g/dL — ABNORMAL LOW (ref 12.0–15.0)
Immature Granulocytes: 2 %
Lymphocytes Relative: 51 %
Lymphs Abs: 0.5 10*3/uL — ABNORMAL LOW (ref 0.7–4.0)
MCH: 28.8 pg (ref 26.0–34.0)
MCHC: 33.8 g/dL (ref 30.0–36.0)
MCV: 85 fL (ref 80.0–100.0)
Monocytes Absolute: 0.1 10*3/uL (ref 0.1–1.0)
Monocytes Relative: 8 %
Neutro Abs: 0.3 10*3/uL — CL (ref 1.7–7.7)
Neutrophils Relative %: 37 %
Platelet Count: 70 10*3/uL — ABNORMAL LOW (ref 150–400)
RBC: 3.13 MIL/uL — ABNORMAL LOW (ref 3.87–5.11)
RDW: 14.5 % (ref 11.5–15.5)
WBC Count: 0.9 10*3/uL — CL (ref 4.0–10.5)
nRBC: 0 % (ref 0.0–0.2)

## 2019-06-29 LAB — CMP (CANCER CENTER ONLY)
ALT: 21 U/L (ref 0–44)
AST: 22 U/L (ref 15–41)
Albumin: 3.5 g/dL (ref 3.5–5.0)
Alkaline Phosphatase: 75 U/L (ref 38–126)
Anion gap: 10 (ref 5–15)
BUN: 32 mg/dL — ABNORMAL HIGH (ref 8–23)
CO2: 22 mmol/L (ref 22–32)
Calcium: 8.7 mg/dL — ABNORMAL LOW (ref 8.9–10.3)
Chloride: 107 mmol/L (ref 98–111)
Creatinine: 1.25 mg/dL — ABNORMAL HIGH (ref 0.44–1.00)
GFR, Est AFR Am: 46 mL/min — ABNORMAL LOW (ref >60)
GFR, Estimated: 40 mL/min — ABNORMAL LOW (ref >60)
Glucose, Bld: 128 mg/dL — ABNORMAL HIGH (ref 70–99)
Potassium: 4.4 mmol/L (ref 3.5–5.1)
Sodium: 139 mmol/L (ref 135–145)
Total Bilirubin: 0.8 mg/dL (ref 0.3–1.2)
Total Protein: 6.7 g/dL (ref 6.5–8.1)

## 2019-06-29 LAB — SAMPLE TO BLOOD BANK

## 2019-06-29 MED ORDER — SODIUM CHLORIDE 0.9% FLUSH
10.0000 mL | INTRAVENOUS | Status: AC | PRN
  Administered 2019-06-29: 19:00:00 10 mL
  Filled 2019-06-29: qty 10

## 2019-06-29 MED ORDER — SODIUM CHLORIDE 0.9% IV SOLUTION
250.0000 mL | Freq: Once | INTRAVENOUS | Status: AC
  Administered 2019-06-29: 15:00:00 250 mL via INTRAVENOUS
  Filled 2019-06-29: qty 250

## 2019-06-29 MED ORDER — DIPHENHYDRAMINE HCL 25 MG PO CAPS
ORAL_CAPSULE | ORAL | Status: AC
  Filled 2019-06-29: qty 1

## 2019-06-29 MED ORDER — DIPHENHYDRAMINE HCL 25 MG PO CAPS: 25.0000 mg | ORAL_CAPSULE | Freq: Once | ORAL | Status: DC

## 2019-06-29 MED ORDER — SODIUM CHLORIDE 0.9% FLUSH
10.0000 mL | INTRAVENOUS | Status: DC | PRN
  Administered 2019-06-29: 10 mL
  Filled 2019-06-29: qty 10

## 2019-06-29 MED ORDER — DIPHENHYDRAMINE HCL 25 MG PO CAPS
25.0000 mg | ORAL_CAPSULE | Freq: Once | ORAL | Status: AC
  Administered 2019-06-29: 25 mg via ORAL

## 2019-06-29 MED ORDER — HEPARIN SOD (PORK) LOCK FLUSH 100 UNIT/ML IV SOLN
250.0000 [IU] | INTRAVENOUS | Status: DC | PRN
  Filled 2019-06-29: qty 5

## 2019-06-29 MED ORDER — HEPARIN SOD (PORK) LOCK FLUSH 100 UNIT/ML IV SOLN
500.0000 [IU] | Freq: Once | INTRAVENOUS | Status: AC | PRN
  Administered 2019-06-29: 19:00:00 500 [IU]
  Filled 2019-06-29: qty 5

## 2019-06-29 NOTE — Progress Notes (Signed)
CRITICAL VALUE STICKER  CRITICAL VALUE:  WBC 0.9, Hbg 9.0 and ANC 0.3  RECEIVER (on-site recipient of call):  Valda Favia RN  DATE & TIME NOTIFIED: 06/29/2019  2:05 pm  MESSENGER (representative from lab):Jenn  MD NOTIFIED: Cira Rue NP    TIME OF NOTIFICATION:   2:10 pm  RESPONSE: No new orders received at this time.

## 2019-06-29 NOTE — Telephone Encounter (Signed)
  Malachy Mood, could you fax her recent lab and this phone message to her MD at [place]? I suspect her symptoms are more related to her chemo treatment change, we need to make them aware. Thanks   Truitt Merle MD

## 2019-06-29 NOTE — Telephone Encounter (Signed)
I called her son back about his concerns that she has more fatigue after changing to oral decitabine. He worries she needs more blood lately. She received blood on 11/23 and again today for worsening fatigue and dyspnea despite hgb 9.0, previous transfusion was 02/2019. I reviewed her symptoms and pancytopenia are likely treatment related rather than due to worsening disease but no way to say with [id number] certainty at this time, he understands. I expect her counts to improve with time. She will return for labs on 12/4. He appreciated the call.  Cira Rue, NP  06/29/2019

## 2019-06-29 NOTE — Patient Instructions (Signed)
Blood Transfusion, Adult, Care After This sheet gives you information about how to care for yourself after your procedure. Your doctor may also give you more specific instructions. If you have problems or questions, contact your doctor. Follow these instructions at home:   Take over-the-counter and prescription medicines only as told by your doctor.  Go back to your normal activities as told by your doctor.  Follow instructions from your doctor about how to take care of the area where an IV tube was put into your vein (insertion site). Make sure you: ? Wash your hands with soap and water before you change your bandage (dressing). If there is no soap and water, use hand sanitizer. ? Change your bandage as told by your doctor.  Check your IV insertion site every day for signs of infection. Check for: ? More redness, swelling, or pain. ? More fluid or blood. ? Warmth. ? Pus or a bad smell. Contact a doctor if:  You have more redness, swelling, or pain around the IV insertion site.  You have more fluid or blood coming from the IV insertion site.  Your IV insertion site feels warm to the touch.  You have pus or a bad smell coming from the IV insertion site.  Your pee (urine) turns pink, red, or brown.  You feel weak after doing your normal activities. Get help right away if:  You have signs of a serious allergic or body defense (immune) system reaction, including: ? Itchiness. ? Hives. ? Trouble breathing. ? Anxiety. ? Pain in your chest or lower back. ? Fever, flushing, and chills. ? Fast pulse. ? Rash. ? Watery poop (diarrhea). ? Throwing up (vomiting). ? Dark pee. ? Serious headache. ? Dizziness. ? Stiff neck. ? Yellow color in your face or the white parts of your eyes (jaundice). Summary  After a blood transfusion, return to your normal activities as told by your doctor.  Every day, check for signs of infection where the IV tube was put into your vein.  Some  signs of infection are warm skin, more redness and pain, more fluid or blood, and pus or a bad smell where the needle went in.  Contact your doctor if you feel weak or have any unusual symptoms. This information is not intended to replace advice given to you by your health care provider. Make sure you discuss any questions you have with your health care provider. Document Released: 08/05/2014 Document Revised: 11/19/2017 Document Reviewed: 03/08/2016 Elsevier Patient Education  2020 Elsevier Inc.  

## 2019-06-29 NOTE — Telephone Encounter (Signed)
Confirmed 12/4 lab with son.  Son requesting call from [place] due to his concern about mom needing blood more often and feeling like she is on a downward slop. Message toYF/LB.

## 2019-06-30 ENCOUNTER — Telehealth: Payer: Self-pay

## 2019-06-30 LAB — BPAM RBC
Blood Product Expiration Date: 202012222359
ISSUE DATE / TIME: 202012011610
Unit Type and Rh: 6200

## 2019-06-30 LAB — TYPE AND SCREEN
ABO/RH(D): A POS
Antibody Screen: NEGATIVE
Unit division: 0

## 2019-06-30 NOTE — Telephone Encounter (Signed)
Faxed recent phone message from patient's son and recent labs to Dr. Leretha Pol at [facility].

## 2019-07-02 ENCOUNTER — Inpatient Hospital Stay: Payer: Medicare Other

## 2019-07-02 ENCOUNTER — Telehealth: Payer: Self-pay | Admitting: Hematology

## 2019-07-02 ENCOUNTER — Other Ambulatory Visit: Payer: Self-pay

## 2019-07-02 DIAGNOSIS — D469 Myelodysplastic syndrome, unspecified: Secondary | ICD-10-CM | POA: Diagnosis not present

## 2019-07-02 DIAGNOSIS — Z95828 Presence of other vascular implants and grafts: Secondary | ICD-10-CM

## 2019-07-02 DIAGNOSIS — D46Z Other myelodysplastic syndromes: Secondary | ICD-10-CM

## 2019-07-02 LAB — CBC WITH DIFFERENTIAL (CANCER CENTER ONLY)
Abs Immature Granulocytes: 0.01 10*3/uL (ref 0.00–0.07)
Basophils Absolute: 0 10*3/uL (ref 0.0–0.1)
Basophils Relative: 1 %
Eosinophils Absolute: 0 10*3/uL (ref 0.0–0.5)
Eosinophils Relative: 2 %
HCT: 30.4 % — ABNORMAL LOW (ref 36.0–46.0)
Hemoglobin: 10.5 g/dL — ABNORMAL LOW (ref 12.0–15.0)
Immature Granulocytes: 1 %
Lymphocytes Relative: 50 %
Lymphs Abs: 0.6 10*3/uL — ABNORMAL LOW (ref 0.7–4.0)
MCH: 29.5 pg (ref 26.0–34.0)
MCHC: 34.5 g/dL (ref 30.0–36.0)
MCV: 85.4 fL (ref 80.0–100.0)
Monocytes Absolute: 0.1 10*3/uL (ref 0.1–1.0)
Monocytes Relative: 8 %
Neutro Abs: 0.5 10*3/uL — ABNORMAL LOW (ref 1.7–7.7)
Neutrophils Relative %: 38 %
Platelet Count: 87 10*3/uL — ABNORMAL LOW (ref 150–400)
RBC: 3.56 MIL/uL — ABNORMAL LOW (ref 3.87–5.11)
RDW: 14.6 % (ref 11.5–15.5)
WBC Count: 1.2 10*3/uL — ABNORMAL LOW (ref 4.0–10.5)
nRBC: 0 % (ref 0.0–0.2)

## 2019-07-02 LAB — SAMPLE TO BLOOD BANK

## 2019-07-02 MED ORDER — SODIUM CHLORIDE 0.9% FLUSH
10.0000 mL | INTRAVENOUS | Status: DC | PRN
  Administered 2019-07-02: 10 mL
  Filled 2019-07-02: qty 10

## 2019-07-02 NOTE — Telephone Encounter (Signed)
I called pt's son and discussed her lab results from today. She has felt slightly better after blood transfusion early this week, no fever or chills.  Her severe neutropenia has improved, ANC 0.5 today.  Anemia and thrombocytopenia improved also.  I think her fatigue lately is probably related to oral decitabine, although I have not prescribed this oral chemo and do not have enough experience. Her son would like her to change back to iv decitabine, she is due for chemo next Wednesday. He is waiting a call from Dr. Leretha Pol at [facility]. He will call us after her next cycle chemo for lab appointment.   Truitt Merle  07/02/2019

## 2019-07-07 DIAGNOSIS — D469 Myelodysplastic syndrome, unspecified: Secondary | ICD-10-CM | POA: Diagnosis not present

## 2019-07-20 ENCOUNTER — Telehealth: Payer: Self-pay | Admitting: Nurse Practitioner

## 2019-07-20 ENCOUNTER — Inpatient Hospital Stay: Payer: Medicare Other

## 2019-07-20 ENCOUNTER — Inpatient Hospital Stay (HOSPITAL_BASED_OUTPATIENT_CLINIC_OR_DEPARTMENT_OTHER): Payer: Medicare Other | Admitting: Nurse Practitioner

## 2019-07-20 ENCOUNTER — Encounter: Payer: Self-pay | Admitting: Nurse Practitioner

## 2019-07-20 ENCOUNTER — Other Ambulatory Visit: Payer: Self-pay

## 2019-07-20 VITALS — BP 150/59 | HR 88 | Temp 98.2°F | Resp 18 | Ht 64.0 in | Wt 193.5 lb

## 2019-07-20 VITALS — BP 141/59 | HR 70 | Temp 98.1°F | Resp 16

## 2019-07-20 DIAGNOSIS — D46Z Other myelodysplastic syndromes: Secondary | ICD-10-CM | POA: Diagnosis not present

## 2019-07-20 DIAGNOSIS — Z95828 Presence of other vascular implants and grafts: Secondary | ICD-10-CM

## 2019-07-20 DIAGNOSIS — D469 Myelodysplastic syndrome, unspecified: Secondary | ICD-10-CM | POA: Diagnosis not present

## 2019-07-20 LAB — CMP (CANCER CENTER ONLY)
ALT: 15 U/L (ref 0–44)
AST: 21 U/L (ref 15–41)
Albumin: 3.3 g/dL — ABNORMAL LOW (ref 3.5–5.0)
Alkaline Phosphatase: 67 U/L (ref 38–126)
Anion gap: 10 (ref 5–15)
BUN: 22 mg/dL (ref 8–23)
CO2: 25 mmol/L (ref 22–32)
Calcium: 8.5 mg/dL — ABNORMAL LOW (ref 8.9–10.3)
Chloride: 105 mmol/L (ref 98–111)
Creatinine: 1.28 mg/dL — ABNORMAL HIGH (ref 0.44–1.00)
GFR, Est AFR Am: 45 mL/min — ABNORMAL LOW (ref >60)
GFR, Estimated: 39 mL/min — ABNORMAL LOW (ref >60)
Glucose, Bld: 102 mg/dL — ABNORMAL HIGH (ref 70–99)
Potassium: 4.5 mmol/L (ref 3.5–5.1)
Sodium: 140 mmol/L (ref 135–145)
Total Bilirubin: 0.9 mg/dL (ref 0.3–1.2)
Total Protein: 6.5 g/dL (ref 6.5–8.1)

## 2019-07-20 LAB — CBC WITH DIFFERENTIAL (CANCER CENTER ONLY)
Abs Immature Granulocytes: 0.11 10*3/uL — ABNORMAL HIGH (ref 0.00–0.07)
Basophils Absolute: 0 10*3/uL (ref 0.0–0.1)
Basophils Relative: 0 %
Eosinophils Absolute: 0 10*3/uL (ref 0.0–0.5)
Eosinophils Relative: 0 %
HCT: 23 % — ABNORMAL LOW (ref 36.0–46.0)
Hemoglobin: 7.8 g/dL — ABNORMAL LOW (ref 12.0–15.0)
Immature Granulocytes: 3 %
Lymphocytes Relative: 12 %
Lymphs Abs: 0.5 10*3/uL — ABNORMAL LOW (ref 0.7–4.0)
MCH: 28.2 pg (ref 26.0–34.0)
MCHC: 33.9 g/dL (ref 30.0–36.0)
MCV: 83 fL (ref 80.0–100.0)
Monocytes Absolute: 0.3 10*3/uL (ref 0.1–1.0)
Monocytes Relative: 6 %
Neutro Abs: 3.5 10*3/uL (ref 1.7–7.7)
Neutrophils Relative %: 79 %
Platelet Count: 72 10*3/uL — ABNORMAL LOW (ref 150–400)
RBC: 2.77 MIL/uL — ABNORMAL LOW (ref 3.87–5.11)
RDW: 14.3 % (ref 11.5–15.5)
WBC Count: 4.5 10*3/uL (ref 4.0–10.5)
nRBC: 0.4 % — ABNORMAL HIGH (ref 0.0–0.2)

## 2019-07-20 LAB — PREPARE RBC (CROSSMATCH)

## 2019-07-20 LAB — SAMPLE TO BLOOD BANK

## 2019-07-20 MED ORDER — SODIUM CHLORIDE 0.9% FLUSH
10.0000 mL | INTRAVENOUS | Status: DC | PRN
  Administered 2019-07-20: 10 mL
  Filled 2019-07-20: qty 10

## 2019-07-20 MED ORDER — DIPHENHYDRAMINE HCL 25 MG PO CAPS
ORAL_CAPSULE | ORAL | Status: AC
  Filled 2019-07-20: qty 1

## 2019-07-20 MED ORDER — HEPARIN SOD (PORK) LOCK FLUSH 100 UNIT/ML IV SOLN
500.0000 [IU] | Freq: Once | INTRAVENOUS | Status: AC | PRN
  Administered 2019-07-20: 500 [IU]
  Filled 2019-07-20: qty 5

## 2019-07-20 MED ORDER — HEPARIN SOD (PORK) LOCK FLUSH 100 UNIT/ML IV SOLN
500.0000 [IU] | Freq: Once | INTRAVENOUS | Status: AC | PRN
  Administered 2019-07-20: 17:00:00 500 [IU]
  Filled 2019-07-20: qty 5

## 2019-07-20 MED ORDER — SODIUM CHLORIDE 0.9% IV SOLUTION
250.0000 mL | Freq: Once | INTRAVENOUS | Status: AC
  Administered 2019-07-20: 14:00:00 250 mL via INTRAVENOUS
  Filled 2019-07-20: qty 250

## 2019-07-20 MED ORDER — DIPHENHYDRAMINE HCL 25 MG PO CAPS
25.0000 mg | ORAL_CAPSULE | Freq: Once | ORAL | Status: AC
  Administered 2019-07-20: 25 mg via ORAL

## 2019-07-20 NOTE — Progress Notes (Signed)
Patient has a double port, flushed and de accessed lateral port. Flushed and left medial port accessed also informed Jasmin R, LPN that if she didn't receive blood today to de access the patient

## 2019-07-20 NOTE — Patient Instructions (Signed)

## 2019-07-20 NOTE — Telephone Encounter (Signed)
Scheduled appt per 12/22 los.  Sent 3 hour RBC/PLT infusion as an add-on for 12/30.  Will contact pt once appt has been added.

## 2019-07-20 NOTE — Patient Instructions (Signed)
Blood Transfusion, Adult, Care After This sheet gives you information about how to care for yourself after your procedure. Your doctor may also give you more specific instructions. If you have problems or questions, contact your doctor. Follow these instructions at home:   Take over-the-counter and prescription medicines only as told by your doctor.  Go back to your normal activities as told by your doctor.  Follow instructions from your doctor about how to take care of the area where an IV tube was put into your vein (insertion site). Make sure you: ? Wash your hands with soap and water before you change your bandage (dressing). If there is no soap and water, use hand sanitizer. ? Change your bandage as told by your doctor.  Check your IV insertion site every day for signs of infection. Check for: ? More redness, swelling, or pain. ? More fluid or blood. ? Warmth. ? Pus or a bad smell. Contact a doctor if:  You have more redness, swelling, or pain around the IV insertion site.  You have more fluid or blood coming from the IV insertion site.  Your IV insertion site feels warm to the touch.  You have pus or a bad smell coming from the IV insertion site.  Your pee (urine) turns pink, red, or brown.  You feel weak after doing your normal activities. Get help right away if:  You have signs of a serious allergic or body defense (immune) system reaction, including: ? Itchiness. ? Hives. ? Trouble breathing. ? Anxiety. ? Pain in your chest or lower back. ? Fever, flushing, and chills. ? Fast pulse. ? Rash. ? Watery poop (diarrhea). ? Throwing up (vomiting). ? Dark pee. ? Serious headache. ? Dizziness. ? Stiff neck. ? Yellow color in your face or the white parts of your eyes (jaundice). Summary  After a blood transfusion, return to your normal activities as told by your doctor.  Every day, check for signs of infection where the IV tube was put into your vein.  Some  signs of infection are warm skin, more redness and pain, more fluid or blood, and pus or a bad smell where the needle went in.  Contact your doctor if you feel weak or have any unusual symptoms. This information is not intended to replace advice given to you by your health care provider. Make sure you discuss any questions you have with your health care provider. Document Released: 08/05/2014 Document Revised: 11/19/2017 Document Reviewed: 03/08/2016 Elsevier Patient Education  2020 Elsevier Inc.  

## 2019-07-20 NOTE — Progress Notes (Signed)
[place]   Telephone:([phone] Fax:(336) [phone]   Clinic Follow up Note   Patient Care Team: Chetty, Golden Pop, MD as PCP - General (Family Medicine) 07/20/2019  CHIEF COMPLAINT: F/u MDS  SUMMARY OF ONCOLOGIC HISTORY: Oncology History  MDS (myelodysplastic syndrome), high grade ([place])  11/13/2018 Bone Marrow Biopsy   1. BM biopsy & aspirate 11/13/18: hypercellular marrow (40-50%) with markedly left shifted erythroid predominance and dysplasia. 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 Negative for increased blasts. Associated flow cytometry demonstrated no discrete population of atypical blasts. The findings were consistent with myelodysplastic syndrome with multi-lineage dysplasia (MDS-MLD). While the aspirate smear and IHC staining showed a marked increase in early erythroid precursors, the strict criteria for acute erythroleukemia were NOT met. However, close clinical follow up is required as further disease progression may occur. MDS FISH panel showed 35.5% of cells showed monosomy 5 by interphase FISH; 2-3.5% showed signal patterns consistent with additional copies of chromosomes 7, 8, and 20. The significance, if any, of this low level cell population is uncertain.    02/06/2019 -  Chemotherapy   Monhtly Decitibine as inpatient for 5 days starting on 02/06/19 at [facility]. Plan for 4-6 months.     02/18/2019 Initial Diagnosis   MDS (myelodysplastic syndrome), high grade (HCC)     CURRENT THERAPY:  1. Decitabine day 1-5 every 4 weeksstarting 02/06/19 at [facility]. Switched to oral decitabine on 05/12/19.  2. Blood and platelet transfusions as needed  INTERVAL HISTORY: Heather Olson returns for f/u as scheduled. She was last seen at [facility] on 07/07/19 and began cycle 5 oral decitabine. Due to her increased fatigue and transfusion requirement she was dose reduced to pills orally on days 1-4 rather than 1-5. She felt no improvement, fatigue is actually worse. Her  son on the phone notes she "lags" always. Her fatigue worsened when she changed from IV to oral decitabine. She is able to be out of bed, walks at home. She is dyspneic on exertion and was dizzy couple days ago, no cough, chest pain, fever, chills. Denies bleeding. Eats and drinks adequately. Manages constipation with senna. She has 1 week h/o intermittent mild "nagging" RUQ pain. No injury or fall. No n/v. Last BM 1 day ago.   MEDICAL HISTORY:  Past Medical History:  Diagnosis Date  . Arthritis   . GERD (gastroesophageal reflux disease)   . Headache(784.0)   . Hypertension   . Polymyositis ([place])   . Swelling of both ankles     SURGICAL HISTORY: Past Surgical History:  Procedure Laterality Date  . ABDOMINAL HYSTERECTOMY    . APPENDECTOMY    . CHOLECYSTECTOMY N/A 05/28/2013   Procedure: LAPAROSCOPIC CHOLECYSTECTOMY ;  Surgeon: Gwenyth Ober, MD;  Location: [place];  Service: General;  Laterality: N/A;  . TONSILLECTOMY      I have reviewed the social history and family history with the patient and they are unchanged from previous note.  ALLERGIES:  is allergic to butalbital-apap-caffeine and lisinopril.  MEDICATIONS:  Current Outpatient Medications  Medication Sig Dispense Refill  . acetaminophen (TYLENOL) 650 MG CR tablet Take 650 mg by mouth every 8 (eight) hours as needed for pain.    Marland Kitchen acyclovir (ZOVIRAX) 200 MG capsule     . Decitabine-Cedazuridine 35-100 MG TABS Take by mouth.    Marland Kitchen FLUZONE HIGH-DOSE QUADRIVALENT 0.7 ML SUSY ADM 0.7ML IM UTD    . hydrochlorothiazide (HYDRODIURIL) 25 MG tablet Take 25 mg by mouth daily.    Marland Kitchen  lactulose (CHRONULAC) 10 GM/15ML solution SMARTSIG:15 Milliliter(s) By Mouth Every 4 Hours PRN    . losartan (COZAAR) 50 MG tablet Take 50 mg by mouth daily.    . Multiple Vitamin (MULTIVITAMIN WITH MINERALS) TABS tablet Take 1 tablet by mouth daily.    . OMEGA-3 FATTY ACIDS PO Take by mouth.    Marland Kitchen omeprazole (PRILOSEC) 40 MG capsule Take 40 mg by mouth  daily.     . predniSONE (DELTASONE) 5 MG tablet Take 5 mg by mouth daily.    . traMADol (ULTRAM) 50 MG tablet Take 0.5 tablets (25 mg total) by mouth every 6 (six) hours as needed for severe pain. 15 tablet 0  . divalproex (DEPAKOTE) 250 MG DR tablet Take 250-500 mg by mouth See admin instructions. Take 2 tablets (500 mg) by mouth every morning and 1 tablet (250 mg) at night - For occipital neuralgia     No current facility-administered medications for this visit.    PHYSICAL EXAMINATION: ECOG PERFORMANCE STATUS: 2 - Symptomatic, <50% confined to bed  Vitals:   07/20/19 1231  BP: (!) 150/59  Pulse: 88  Resp: 18  Temp: 98.2 F (36.8 C)  SpO2: 100%   Filed Weights   07/20/19 1231  Weight: 193 lb 8 oz (87.8 kg)    GENERAL:alert, no distress and comfortable SKIN: no rash  EYES: sclera clear LUNGS: clear with normal breathing effort HEART: regular rate & rhythm, no lower extremity edema ABDOMEN: abdomen soft, non-tender and normal bowel sounds. No RUQ tenderness or hepatomegaly  NEURO: alert & oriented x 3 with fluent speech, no focal motor/sensory deficits PAC without erythema   LABORATORY DATA:  I have reviewed the data as listed CBC Latest Ref Rng & Units 07/20/2019 07/02/2019 06/29/2019  WBC 4.0 - 10.5 K/uL 4.5 1.2(L) 0.9(LL)  Hemoglobin 12.0 - 15.0 g/dL 7.8(L) 10.5(L) 9.0(L)  Hematocrit 36.0 - 46.0 % 23.0(L) 30.4(L) 26.6(L)  Platelets 150 - 400 K/uL 72(L) 87(L) 70(L)     CMP Latest Ref Rng & Units 07/20/2019 06/29/2019 06/21/2019  Glucose 70 - 99 mg/dL 102(H) 128(H) 123(H)  BUN 8 - 23 mg/dL 22 32(H) 25(H)  Creatinine 0.44 - 1.00 mg/dL 1.28(H) 1.25(H) 1.29(H)  Sodium 135 - 145 mmol/L 140 139 140  Potassium 3.5 - 5.1 mmol/L 4.5 4.4 4.6  Chloride 98 - 111 mmol/L 105 107 107  CO2 22 - 32 mmol/L _0 Calcium 8.9 - 10.3 mg/dL 8.5(L) 8.7(L) 8.5(L)  Total Protein 6.5 - 8.1 g/dL 6.5 6.7 6.5  Total Bilirubin 0.3 - 1.2 mg/dL 0.9 0.8 0.8  Alkaline Phos 38 - 126 U/L 67 75  63  AST 15 - 41 U/L _1 ALT 0 - 44 U/L _2 RADIOGRAPHIC STUDIES: I have personally reviewed the radiological images as listed and agreed with the findings in the report. No results found.   ASSESSMENT & PLAN: Heather Olson a 85 y.o.femalewith   1. MDSwith pancytopenia, R-IPSS 4.0, intermediate risk  -Presented with initial anemia then pancytopenia. Admitted at [facility] twice this year due to loss of consciousness and headaches. She underwent Bone marrow biopsy on 11/13/18 which showed evidence of MDS.Cytogenetics positive for chromosome 5 deletion, and low level (2-3.5%) of additionalcopies of chromosome 7, 8 and 20 based on FISH.Based on his R-IPSS she has intermediate risk disease with median survival of 3 years -Sheinitially was on supportive care with blood transfusion, and all treatment, due to the worsening cytopenia,Dr. Leretha Pol  recommended treatment with chemo Decitabine  -She has PAC in place.  -per Dr. Leretha Pol she started monthly IV Decitabine as inpatient for 5 days on 02/06/19 at [facility]. Plan for 4-6 months. she tolerated very well. She was changed to oral decitabine per [facility].  -Ms. Dejoy appears stable today. She was seen at [facility] and completed cycle 5 oral decitabine dose reduced to days 1-4  -she tolerated mildly well with worsening fatigue, DOE, and few episodes of dizziness. Hgb 7.8 today, PLT 72K. WBC normal, ANC is pending.  -Will transfuse 2 units RBCs for symptomatic anemia, each unit over 3 hours. Her son was also on the phone and was updated -will repeat labs and possible transfusion on 12/30, per son's request for that date  -She will return to [facility] for f/u and cycle 6 in 2 weeks -f/u with Dr. Burr Medico in 4 weeks   2.Anemiafrom MDS and chemo -Chronic, worsenedaroundMDS diagnosis -She received blood transfusions as neededif Hg<8.0.On 7/28, 7/31 -S/p on Aranesp on 12/23/18-02/03/19 -she has increased transfusion requirement lately, cycle 5  oral decitabine was dose reduced to days 1-4  -Hgb 7.8 today and symptomatic with fatigue, DOE, and dizziness. Will transfuse 2 units this week   3. RUQ pain -She developed intermittent mild RUQ pain 1 week ago, no known cause or contributing factor, not associated with meals or BMs -LFTs are normal, exam is benign today -will monitor closely, if pain worsens or labs change will get abd [place]  4. Thrombocytopenia, secondary to MDSand chemo -She has been treated with platelet infusions 7/17/-02/16/19 -PLT 72K today, no bleeding; no transfusion   5. Hearing impaired, HA  -She has cochlear implant in right eat and hearing aid in left ear.   6. Polymyositis  -She waspreviouslyon Imuran  -She is on Prednisone, does not take Tramadol  -Managed by RA Dr. Sibyl Parr  7. HTN, GERD -On HCTZ and losartan, and Prilosec for GERD -stable   PLAN: -Labs reviewed -Hgb 7.8, symptomatic - transfuse 1 unit today, second unit 12/23 at 8 am -Lab and possible transfusion next week -Return to [facility] in 2 weeks for f/u and cycle 6 -Lab and f/u with Dr. Burr Medico in 4 weeks   No problem-specific [place] notes found for this encounter.   Orders Placed This Encounter  Procedures  . Practitioner attestation of consent    I, the ordering practitioner, attest that I have discussed with the patient the benefits, risks, side effects, alternatives, likelihood of achieving goals and potential problems during recovery for the procedure listed.    Standing Status:   Future    Standing Expiration Date:   07/19/2020    Order Specific Question:   Procedure    Answer:   Blood Product(s)  . Complete patient signature process for consent form    Standing Status:   Future    Standing Expiration Date:   07/19/2020  . Care order/instruction    Transfuse Parameters    Standing Status:   Future    Standing Expiration Date:   07/19/2020  . Type and screen    Standing Status:   Future    Number of  Occurrences:   1    Standing Expiration Date:   07/19/2020   All questions were answered. The patient knows to call the clinic with any problems, questions or concerns. No barriers to learning was detected. I spent 20 minutes counseling the patient face to face. The total time spent in the appointment was 25 minutes and  more than 50% was on counseling and review of test results     Alla Feeling, NP 07/20/19

## 2019-07-21 ENCOUNTER — Other Ambulatory Visit: Payer: Self-pay

## 2019-07-21 ENCOUNTER — Inpatient Hospital Stay: Payer: Medicare Other

## 2019-07-21 DIAGNOSIS — D46Z Other myelodysplastic syndromes: Secondary | ICD-10-CM

## 2019-07-21 DIAGNOSIS — D469 Myelodysplastic syndrome, unspecified: Secondary | ICD-10-CM | POA: Diagnosis not present

## 2019-07-21 MED ORDER — SODIUM CHLORIDE 0.9% FLUSH
10.0000 mL | INTRAVENOUS | Status: DC | PRN
  Filled 2019-07-21: qty 10

## 2019-07-21 MED ORDER — DIPHENHYDRAMINE HCL 25 MG PO CAPS
25.0000 mg | ORAL_CAPSULE | Freq: Once | ORAL | Status: AC
  Administered 2019-07-21: 09:00:00 25 mg via ORAL

## 2019-07-21 MED ORDER — HEPARIN SOD (PORK) LOCK FLUSH 100 UNIT/ML IV SOLN
500.0000 [IU] | Freq: Every day | INTRAVENOUS | Status: DC | PRN
  Filled 2019-07-21: qty 5

## 2019-07-21 MED ORDER — DIPHENHYDRAMINE HCL 25 MG PO CAPS
ORAL_CAPSULE | ORAL | Status: AC
  Filled 2019-07-21: qty 1

## 2019-07-21 MED ORDER — SODIUM CHLORIDE 0.9% IV SOLUTION
250.0000 mL | Freq: Once | INTRAVENOUS | Status: AC
  Administered 2019-07-21: 09:00:00 250 mL via INTRAVENOUS
  Filled 2019-07-21: qty 250

## 2019-07-21 NOTE — Patient Instructions (Signed)
Blood Transfusion, Adult, Care After This sheet gives you information about how to care for yourself after your procedure. Your doctor may also give you more specific instructions. If you have problems or questions, contact your doctor. Follow these instructions at home:   Take over-the-counter and prescription medicines only as told by your doctor.  Go back to your normal activities as told by your doctor.  Follow instructions from your doctor about how to take care of the area where an IV tube was put into your vein (insertion site). Make sure you: ? Wash your hands with soap and water before you change your bandage (dressing). If there is no soap and water, use hand sanitizer. ? Change your bandage as told by your doctor.  Check your IV insertion site every day for signs of infection. Check for: ? More redness, swelling, or pain. ? More fluid or blood. ? Warmth. ? Pus or a bad smell. Contact a doctor if:  You have more redness, swelling, or pain around the IV insertion site.  You have more fluid or blood coming from the IV insertion site.  Your IV insertion site feels warm to the touch.  You have pus or a bad smell coming from the IV insertion site.  Your pee (urine) turns pink, red, or brown.  You feel weak after doing your normal activities. Get help right away if:  You have signs of a serious allergic or body defense (immune) system reaction, including: ? Itchiness. ? Hives. ? Trouble breathing. ? Anxiety. ? Pain in your chest or lower back. ? Fever, flushing, and chills. ? Fast pulse. ? Rash. ? Watery poop (diarrhea). ? Throwing up (vomiting). ? Dark pee. ? Serious headache. ? Dizziness. ? Stiff neck. ? Yellow color in your face or the white parts of your eyes (jaundice). Summary  After a blood transfusion, return to your normal activities as told by your doctor.  Every day, check for signs of infection where the IV tube was put into your vein.  Some  signs of infection are warm skin, more redness and pain, more fluid or blood, and pus or a bad smell where the needle went in.  Contact your doctor if you feel weak or have any unusual symptoms. This information is not intended to replace advice given to you by your health care provider. Make sure you discuss any questions you have with your health care provider. Document Released: 08/05/2014 Document Revised: 11/19/2017 Document Reviewed: 03/08/2016 Elsevier Patient Education  2020 Elsevier Inc.  

## 2019-07-22 LAB — TYPE AND SCREEN
ABO/RH(D): A POS
Antibody Screen: NEGATIVE
Unit division: 0
Unit division: 0

## 2019-07-22 LAB — BPAM RBC
Blood Product Expiration Date: 202101152359
Blood Product Expiration Date: 202101152359
ISSUE DATE / TIME: 202012221420
ISSUE DATE / TIME: 202012230904
Unit Type and Rh: 6200
Unit Type and Rh: 6200

## 2019-07-29 ENCOUNTER — Encounter: Payer: Self-pay | Admitting: Hematology

## 2019-07-29 ENCOUNTER — Other Ambulatory Visit: Payer: Self-pay | Admitting: Hematology

## 2019-07-29 ENCOUNTER — Other Ambulatory Visit: Payer: Self-pay

## 2019-07-29 ENCOUNTER — Ambulatory Visit (HOSPITAL_COMMUNITY)
Admission: RE | Admit: 2019-07-29 | Discharge: 2019-07-29 | Disposition: A | Discharge status: Home / Self Care | Payer: Medicare Other | Source: Ambulatory Visit | Attending: Hematology | Admitting: Hematology

## 2019-07-29 ENCOUNTER — Inpatient Hospital Stay: Payer: Medicare Other

## 2019-07-29 ENCOUNTER — Inpatient Hospital Stay (HOSPITAL_BASED_OUTPATIENT_CLINIC_OR_DEPARTMENT_OTHER): Payer: Medicare Other | Admitting: Hematology

## 2019-07-29 ENCOUNTER — Telehealth: Payer: Self-pay

## 2019-07-29 VITALS — BP 123/53 | HR 109 | Temp 98.0°F | Resp 17 | Ht 64.0 in | Wt 193.6 lb

## 2019-07-29 DIAGNOSIS — D46Z Other myelodysplastic syndromes: Secondary | ICD-10-CM

## 2019-07-29 DIAGNOSIS — Z95828 Presence of other vascular implants and grafts: Secondary | ICD-10-CM

## 2019-07-29 DIAGNOSIS — R531 Weakness: Secondary | ICD-10-CM | POA: Diagnosis not present

## 2019-07-29 DIAGNOSIS — C9200 Acute myeloblastic leukemia, not having achieved remission: Secondary | ICD-10-CM | POA: Diagnosis not present

## 2019-07-29 LAB — CBC WITH DIFFERENTIAL (CANCER CENTER ONLY)
Abs Immature Granulocytes: 0.4 10*3/uL — ABNORMAL HIGH (ref 0.00–0.07)
Band Neutrophils: 1 %
Basophils Absolute: 0 10*3/uL (ref 0.0–0.1)
Basophils Relative: 0 %
Eosinophils Absolute: 0 10*3/uL (ref 0.0–0.5)
Eosinophils Relative: 0 %
HCT: 28.2 % — ABNORMAL LOW (ref 36.0–46.0)
Hemoglobin: 9.7 g/dL — ABNORMAL LOW (ref 12.0–15.0)
Lymphocytes Relative: 13 %
Lymphs Abs: 1.1 10*3/uL (ref 0.7–4.0)
MCH: 29 pg (ref 26.0–34.0)
MCHC: 34.4 g/dL (ref 30.0–36.0)
MCV: 84.4 fL (ref 80.0–100.0)
Metamyelocytes Relative: 4 %
Monocytes Absolute: 1.4 10*3/uL — ABNORMAL HIGH (ref 0.1–1.0)
Monocytes Relative: 16 %
Neutro Abs: 5.1 10*3/uL (ref 1.7–7.7)
Neutrophils Relative %: 57 %
Other: 9 %
Platelet Count: 20 10*3/uL — ABNORMAL LOW (ref 150–400)
RBC: 3.34 MIL/uL — ABNORMAL LOW (ref 3.87–5.11)
RDW: 15.9 % — ABNORMAL HIGH (ref 11.5–15.5)
WBC Count: 8.8 10*3/uL (ref 4.0–10.5)
nRBC: 0 % (ref 0.0–0.2)

## 2019-07-29 LAB — SAMPLE TO BLOOD BANK

## 2019-07-29 MED ORDER — AMOXICILLIN-POT CLAVULANATE 875-125 MG PO TABS: 1.0000 | ORAL_TABLET | Freq: Two times a day (BID) | ORAL | 0 refills | Status: DC

## 2019-07-29 MED ORDER — SODIUM CHLORIDE 0.9% FLUSH
10.0000 mL | INTRAVENOUS | Status: DC | PRN
  Administered 2019-07-29: 10 mL
  Filled 2019-07-29: qty 10

## 2019-07-29 MED ORDER — HEPARIN SOD (PORK) LOCK FLUSH 100 UNIT/ML IV SOLN
500.0000 [IU] | Freq: Once | INTRAVENOUS | Status: AC | PRN
  Administered 2019-07-29: 500 [IU]
  Filled 2019-07-29: qty 5
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
## 2019-07-29 NOTE — Patient Instructions (Signed)

## 2019-07-29 NOTE — Telephone Encounter (Signed)
Left message for patient's son [name] that she is getting a chest x ray and then she will be able to go.  Dr. Burr Medico is sending in a round of antibiotics for her to take but she needs to be tested for Covid.  He was given the information on how to make an appointment for covid testing.   Also we are cancelling the appointment for Saturday for blood as based on her lab results she does not need them.  We are going to schedule lab and possible blood for Monday 08/02/2019.  Encouraged him to call back if he has any questions or concerns.

## 2019-07-30 ENCOUNTER — Inpatient Hospital Stay: Payer: Medicare Other

## 2019-07-31 ENCOUNTER — Inpatient Hospital Stay (HOSPITAL_COMMUNITY)
Admission: EM | Admit: 2019-07-31 | Discharge: 2019-08-06 | DRG: 834 | Disposition: A | Discharge status: Hospice | Payer: Medicare Other | Attending: Internal Medicine | Admitting: Internal Medicine

## 2019-07-31 ENCOUNTER — Encounter (HOSPITAL_COMMUNITY): Payer: Self-pay

## 2019-07-31 ENCOUNTER — Emergency Department (HOSPITAL_COMMUNITY): Payer: Medicare Other

## 2019-07-31 ENCOUNTER — Other Ambulatory Visit: Payer: Self-pay

## 2019-07-31 DIAGNOSIS — D61818 Other pancytopenia: Secondary | ICD-10-CM | POA: Diagnosis not present

## 2019-07-31 DIAGNOSIS — H9190 Unspecified hearing loss, unspecified ear: Secondary | ICD-10-CM | POA: Diagnosis present

## 2019-07-31 DIAGNOSIS — G8929 Other chronic pain: Secondary | ICD-10-CM | POA: Diagnosis present

## 2019-07-31 DIAGNOSIS — D469 Myelodysplastic syndrome, unspecified: Secondary | ICD-10-CM | POA: Diagnosis not present

## 2019-07-31 DIAGNOSIS — I129 Hypertensive chronic kidney disease with stage 1 through stage 4 chronic kidney disease, or unspecified chronic kidney disease: Secondary | ICD-10-CM | POA: Diagnosis present

## 2019-07-31 DIAGNOSIS — M3320 Polymyositis, organ involvement unspecified: Secondary | ICD-10-CM | POA: Diagnosis not present

## 2019-07-31 DIAGNOSIS — C9200 Acute myeloblastic leukemia, not having achieved remission: Principal | ICD-10-CM | POA: Diagnosis present

## 2019-07-31 DIAGNOSIS — Z20822 Contact with and (suspected) exposure to covid-19: Secondary | ICD-10-CM | POA: Diagnosis not present

## 2019-07-31 DIAGNOSIS — N19 Unspecified kidney failure: Secondary | ICD-10-CM

## 2019-07-31 DIAGNOSIS — R197 Diarrhea, unspecified: Secondary | ICD-10-CM | POA: Diagnosis not present

## 2019-07-31 DIAGNOSIS — D696 Thrombocytopenia, unspecified: Secondary | ICD-10-CM | POA: Diagnosis not present

## 2019-07-31 DIAGNOSIS — E860 Dehydration: Secondary | ICD-10-CM | POA: Diagnosis present

## 2019-07-31 DIAGNOSIS — C9500 Acute leukemia of unspecified cell type not having achieved remission: Secondary | ICD-10-CM | POA: Diagnosis not present

## 2019-07-31 DIAGNOSIS — G9341 Metabolic encephalopathy: Secondary | ICD-10-CM | POA: Diagnosis present

## 2019-07-31 DIAGNOSIS — E1122 Type 2 diabetes mellitus with diabetic chronic kidney disease: Secondary | ICD-10-CM | POA: Diagnosis present

## 2019-07-31 DIAGNOSIS — R778 Other specified abnormalities of plasma proteins: Secondary | ICD-10-CM | POA: Diagnosis not present

## 2019-07-31 DIAGNOSIS — R531 Weakness: Secondary | ICD-10-CM

## 2019-07-31 DIAGNOSIS — R4182 Altered mental status, unspecified: Secondary | ICD-10-CM | POA: Diagnosis not present

## 2019-07-31 DIAGNOSIS — N179 Acute kidney failure, unspecified: Secondary | ICD-10-CM | POA: Diagnosis present

## 2019-07-31 DIAGNOSIS — Z833 Family history of diabetes mellitus: Secondary | ICD-10-CM

## 2019-07-31 DIAGNOSIS — E875 Hyperkalemia: Secondary | ICD-10-CM | POA: Diagnosis not present

## 2019-07-31 DIAGNOSIS — I248 Other forms of acute ischemic heart disease: Secondary | ICD-10-CM | POA: Diagnosis not present

## 2019-07-31 DIAGNOSIS — Z66 Do not resuscitate: Secondary | ICD-10-CM | POA: Diagnosis not present

## 2019-07-31 DIAGNOSIS — A419 Sepsis, unspecified organism: Secondary | ICD-10-CM | POA: Diagnosis present

## 2019-07-31 DIAGNOSIS — N1831 Chronic kidney disease, stage 3a: Secondary | ICD-10-CM | POA: Diagnosis present

## 2019-07-31 DIAGNOSIS — Z8041 Family history of malignant neoplasm of ovary: Secondary | ICD-10-CM | POA: Diagnosis not present

## 2019-07-31 DIAGNOSIS — M545 Low back pain: Secondary | ICD-10-CM | POA: Diagnosis present

## 2019-07-31 DIAGNOSIS — Z515 Encounter for palliative care: Secondary | ICD-10-CM | POA: Diagnosis not present

## 2019-07-31 DIAGNOSIS — D46Z Other myelodysplastic syndromes: Secondary | ICD-10-CM | POA: Diagnosis present

## 2019-07-31 DIAGNOSIS — I249 Acute ischemic heart disease, unspecified: Secondary | ICD-10-CM | POA: Diagnosis not present

## 2019-07-31 DIAGNOSIS — K219 Gastro-esophageal reflux disease without esophagitis: Secondary | ICD-10-CM | POA: Diagnosis present

## 2019-07-31 DIAGNOSIS — Z888 Allergy status to other drugs, medicaments and biological substances status: Secondary | ICD-10-CM | POA: Diagnosis not present

## 2019-07-31 DIAGNOSIS — D72829 Elevated white blood cell count, unspecified: Secondary | ICD-10-CM | POA: Diagnosis not present

## 2019-07-31 DIAGNOSIS — Z7952 Long term (current) use of systemic steroids: Secondary | ICD-10-CM | POA: Diagnosis not present

## 2019-07-31 DIAGNOSIS — R0600 Dyspnea, unspecified: Secondary | ICD-10-CM

## 2019-07-31 DIAGNOSIS — R6520 Severe sepsis without septic shock: Secondary | ICD-10-CM | POA: Diagnosis not present

## 2019-07-31 DIAGNOSIS — D638 Anemia in other chronic diseases classified elsewhere: Secondary | ICD-10-CM | POA: Diagnosis not present

## 2019-07-31 DIAGNOSIS — G9340 Encephalopathy, unspecified: Secondary | ICD-10-CM | POA: Diagnosis not present

## 2019-07-31 DIAGNOSIS — N183 Chronic kidney disease, stage 3 unspecified: Secondary | ICD-10-CM | POA: Diagnosis present

## 2019-07-31 DIAGNOSIS — Z9221 Personal history of antineoplastic chemotherapy: Secondary | ICD-10-CM

## 2019-07-31 DIAGNOSIS — I10 Essential (primary) hypertension: Secondary | ICD-10-CM | POA: Diagnosis not present

## 2019-07-31 LAB — CBC WITH DIFFERENTIAL/PLATELET
Abs Immature Granulocytes: 1.76 10*3/uL — ABNORMAL HIGH (ref 0.00–0.07)
Basophils Absolute: 0.1 10*3/uL (ref 0.0–0.1)
Basophils Relative: 0 %
Eosinophils Absolute: 0 10*3/uL (ref 0.0–0.5)
Eosinophils Relative: 0 %
HCT: 29.3 % — ABNORMAL LOW (ref 36.0–46.0)
Hemoglobin: 9.9 g/dL — ABNORMAL LOW (ref 12.0–15.0)
Immature Granulocytes: 10 %
Lymphocytes Relative: 9 %
Lymphs Abs: 1.7 10*3/uL (ref 0.7–4.0)
MCH: 29.5 pg (ref 26.0–34.0)
MCHC: 33.8 g/dL (ref 30.0–36.0)
MCV: 87.2 fL (ref 80.0–100.0)
Monocytes Absolute: 4.4 10*3/uL — ABNORMAL HIGH (ref 0.1–1.0)
Monocytes Relative: 24 %
Neutro Abs: 10.3 10*3/uL — ABNORMAL HIGH (ref 1.7–7.7)
Neutrophils Relative %: 57 %
Platelets: 14 10*3/uL — CL (ref 150–400)
RBC: 3.36 MIL/uL — ABNORMAL LOW (ref 3.87–5.11)
RDW: 16.6 % — ABNORMAL HIGH (ref 11.5–15.5)
WBC: 18.2 10*3/uL — ABNORMAL HIGH (ref 4.0–10.5)
nRBC: 0.1 % (ref 0.0–0.2)

## 2019-07-31 LAB — URINALYSIS, ROUTINE W REFLEX MICROSCOPIC
Bilirubin Urine: NEGATIVE
Glucose, UA: NEGATIVE mg/dL
Hgb urine dipstick: NEGATIVE
Ketones, ur: 5 mg/dL — AB
Leukocytes,Ua: NEGATIVE
Nitrite: NEGATIVE
Protein, ur: 30 mg/dL — AB
Specific Gravity, Urine: 1.018 (ref 1.005–1.030)
pH: 5 (ref 5.0–8.0)

## 2019-07-31 LAB — COMPREHENSIVE METABOLIC PANEL
ALT: 24 U/L (ref 0–44)
AST: 35 U/L (ref 15–41)
Albumin: 2.6 g/dL — ABNORMAL LOW (ref 3.5–5.0)
Alkaline Phosphatase: 71 U/L (ref 38–126)
Anion gap: 13 (ref 5–15)
BUN: 41 mg/dL — ABNORMAL HIGH (ref 8–23)
CO2: 20 mmol/L — ABNORMAL LOW (ref 22–32)
Calcium: 9 mg/dL (ref 8.9–10.3)
Chloride: 105 mmol/L (ref 98–111)
Creatinine, Ser: 2.78 mg/dL — ABNORMAL HIGH (ref 0.44–1.00)
GFR calc Af Amer: 17 mL/min — ABNORMAL LOW (ref >60)
GFR calc non Af Amer: 15 mL/min — ABNORMAL LOW (ref >60)
Glucose, Bld: 134 mg/dL — ABNORMAL HIGH (ref 70–99)
Potassium: 4.7 mmol/L (ref 3.5–5.1)
Sodium: 138 mmol/L (ref 135–145)
Total Bilirubin: 0.8 mg/dL (ref 0.3–1.2)
Total Protein: 6.5 g/dL (ref 6.5–8.1)

## 2019-07-31 LAB — LIPASE, BLOOD: Lipase: 27 U/L (ref 11–51)

## 2019-07-31 LAB — PROTIME-INR
INR: 1.6 — ABNORMAL HIGH (ref 0.8–1.2)
Prothrombin Time: 18.5 seconds — ABNORMAL HIGH (ref 11.4–15.2)

## 2019-07-31 LAB — TROPONIN I (HIGH SENSITIVITY)
Troponin I (High Sensitivity): 218 ng/L (ref <18)
Troponin I (High Sensitivity): 245 ng/L (ref <18)

## 2019-07-31 LAB — CBG MONITORING, ED: Glucose-Capillary: 81 mg/dL (ref 70–99)

## 2019-07-31 LAB — APTT: aPTT: 39 seconds — ABNORMAL HIGH (ref 24–36)

## 2019-07-31 LAB — LACTIC ACID, PLASMA: Lactic Acid, Venous: 1.6 mmol/L (ref 0.5–1.9)

## 2019-07-31 LAB — POC SARS CORONAVIRUS 2 AG -  ED: SARS Coronavirus 2 Ag: NEGATIVE

## 2019-07-31 MED ORDER — PANTOPRAZOLE SODIUM 40 MG PO TBEC
40.0000 mg | DELAYED_RELEASE_TABLET | Freq: Every day | ORAL | Status: DC
  Administered 2019-08-01: 40 mg via ORAL
  Filled 2019-07-31 (×2): qty 1

## 2019-07-31 MED ORDER — PREDNISONE 5 MG PO TABS
5.0000 mg | ORAL_TABLET | Freq: Every day | ORAL | Status: DC
  Administered 2019-08-01: 5 mg via ORAL
  Filled 2019-07-31: qty 1

## 2019-07-31 MED ORDER — ACETAMINOPHEN 650 MG RE SUPP
650.0000 mg | Freq: Four times a day (QID) | RECTAL | Status: DC | PRN
  Administered 2019-08-02: 650 mg via RECTAL
  Filled 2019-07-31: qty 1

## 2019-07-31 MED ORDER — SODIUM CHLORIDE 0.9 % IV SOLN: INTRAVENOUS | Status: DC

## 2019-07-31 MED ORDER — DOCUSATE SODIUM 100 MG PO CAPS
100.0000 mg | ORAL_CAPSULE | Freq: Two times a day (BID) | ORAL | Status: DC
  Filled 2019-07-31 (×6): qty 1

## 2019-07-31 MED ORDER — CHLORHEXIDINE GLUCONATE CLOTH 2 % EX PADS
6.0000 | MEDICATED_PAD | Freq: Every day | CUTANEOUS | Status: DC
  Administered 2019-08-01 – 2019-08-05 (×5): 6 via TOPICAL

## 2019-07-31 MED ORDER — ONDANSETRON HCL 4 MG PO TABS: 4.0000 mg | ORAL_TABLET | Freq: Four times a day (QID) | ORAL | Status: DC | PRN

## 2019-07-31 MED ORDER — VANCOMYCIN HCL 500 MG/100ML IV SOLN
500.0000 mg | INTRAVENOUS | Status: DC
  Administered 2019-08-02: 500 mg via INTRAVENOUS
  Filled 2019-07-31 (×2): qty 100

## 2019-07-31 MED ORDER — METRONIDAZOLE IN NACL 5-0.79 MG/ML-% IV SOLN
500.0000 mg | Freq: Once | INTRAVENOUS | Status: AC
  Administered 2019-07-31: 500 mg via INTRAVENOUS
  Filled 2019-07-31: qty 100

## 2019-07-31 MED ORDER — VANCOMYCIN HCL IN DEXTROSE 1-5 GM/200ML-% IV SOLN
1000.0000 mg | Freq: Once | INTRAVENOUS | Status: AC
  Administered 2019-07-31: 1000 mg via INTRAVENOUS
  Filled 2019-07-31: qty 200

## 2019-07-31 MED ORDER — ASCORBIC ACID 500 MG PO TABS
500.0000 mg | ORAL_TABLET | Freq: Every day | ORAL | Status: DC
  Filled 2019-07-31 (×2): qty 1

## 2019-07-31 MED ORDER — ONDANSETRON HCL 4 MG/2ML IJ SOLN
4.0000 mg | Freq: Four times a day (QID) | INTRAMUSCULAR | Status: DC | PRN
  Administered 2019-08-02: 4 mg via INTRAVENOUS
  Filled 2019-07-31: qty 2

## 2019-07-31 MED ORDER — SODIUM CHLORIDE 0.9 % IV SOLN
2.0000 g | Freq: Once | INTRAVENOUS | Status: AC
  Administered 2019-07-31: 2 g via INTRAVENOUS
  Filled 2019-07-31: qty 2

## 2019-07-31 MED ORDER — VANCOMYCIN HCL 750 MG/150ML IV SOLN
750.0000 mg | Freq: Once | INTRAVENOUS | Status: AC
  Administered 2019-07-31: 750 mg via INTRAVENOUS
  Filled 2019-07-31: qty 150

## 2019-07-31 MED ORDER — SODIUM CHLORIDE 0.9 % IV BOLUS
500.0000 mL | Freq: Once | INTRAVENOUS | Status: AC
  Administered 2019-07-31: 500 mL via INTRAVENOUS

## 2019-07-31 MED ORDER — ADULT MULTIVITAMIN W/MINERALS CH
1.0000 | ORAL_TABLET | Freq: Every day | ORAL | Status: DC
  Filled 2019-07-31 (×2): qty 1

## 2019-07-31 MED ORDER — ACETAMINOPHEN 325 MG PO TABS
650.0000 mg | ORAL_TABLET | Freq: Four times a day (QID) | ORAL | Status: DC | PRN
  Administered 2019-07-31 – 2019-08-01 (×2): 650 mg via ORAL
  Filled 2019-07-31 (×2): qty 2

## 2019-07-31 MED ORDER — ACETAMINOPHEN 500 MG PO TABS
1000.0000 mg | ORAL_TABLET | Freq: Once | ORAL | Status: AC
  Administered 2019-07-31: 1000 mg via ORAL
  Filled 2019-07-31: qty 2

## 2019-07-31 MED ORDER — ACYCLOVIR 200 MG PO CAPS
400.0000 mg | ORAL_CAPSULE | Freq: Two times a day (BID) | ORAL | Status: DC
  Administered 2019-07-31: 400 mg via ORAL
  Filled 2019-07-31 (×10): qty 2

## 2019-07-31 MED ORDER — SODIUM CHLORIDE 0.9 % IV SOLN
2.0000 g | INTRAVENOUS | Status: DC
  Administered 2019-08-01 – 2019-08-03 (×3): 2 g via INTRAVENOUS
  Filled 2019-07-31 (×3): qty 2

## 2019-07-31 NOTE — ED Notes (Signed)
Patient hearing Aids and I-phone in black case have been labeled with with patient sticker and are at bedside on side table.

## 2019-07-31 NOTE — Progress Notes (Signed)
Pharmacy Antibiotic Note  Heather Olson is a 86 y.o. female admitted on 07/31/2019 with sepsis.  Pharmacy has been consulted for vancomycin & cefepime dosing. CXR negative. Phlebotomy unable to get blood cultures in the ED.  UA: rare bacteria, 11-20 non-squamous epithelial cells.  WBC 18.2, SCr 2.78, T max 102.9. Recently placed on Augmentin for URI by Oncology.   Plan: Cefepime 2 gm IV q24 Vanc 1 gm given in ED, then additional 750 mg for 1750 mg loading dose Vancomycin 500 mg IV Q 36 hrs. Goal AUC 400-550. Start 1/4 at 06 am Expected AUC: 499.6 SCr used: 2.78 Vd 0.5 w/ BMI 33 F/u renal fxn, WBC, temp, culture data Vancomycin levels as needed  Height: 5\' 4"  (162.6 cm) Weight: 193 lb 9 oz (87.8 kg) IBW/kg (Calculated) : 54.7  Temp (24hrs), Avg:101.9 F (38.8 C), Min:100.9 F (38.3 C), Max:102.9 F (39.4 C)  Recent Labs  Lab 07/29/19 1149 07/31/19 0914 07/31/19 0923  WBC 8.8 18.2*  --   CREATININE  --  2.78*  --   LATICACIDVEN  --   --  1.6    Estimated Creatinine Clearance: 16.1 mL/min (A) (by C-G formula based on SCr of 2.78 mg/dL (H)).    Allergies  Allergen Reactions  . Butalbital-Apap-Caffeine Itching    Patient reports itching after taking x 1 tablet, so she stopped taking medication  . Imuran [Azathioprine] Other (See Comments)    Luekemia  . Lisinopril Cough    Antimicrobials this admission: 1/2 cefepime> 1/2 vanc>> 1/2 flagyl x 1  Dose adjustments this admission:  Microbiology results: 1/2 covid neg 1/2 UCx: sent 1/2 BCx2: ordered in ED, phlebotomy unable to get>   Thank you for allowing pharmacy to be a part of this patient's care.  Eudelia Bunch, Pharm.D [phone] 07/31/2019 4:27 PM

## 2019-07-31 NOTE — ED Notes (Signed)
Phlebotomy at main lab has been called and will be in patient room ASAP to attain 2x sets of blood cultures.

## 2019-07-31 NOTE — ED Notes (Signed)
Pt was provided peri-care, rectal temp obtained, new chucks placed under pt, purewick applied by EMT & RN.

## 2019-07-31 NOTE — ED Notes (Signed)
Phlebotomy and RN have been unable to attain blood cultures. ED Provider made aware.

## 2019-07-31 NOTE — ED Triage Notes (Signed)
Patient arrived via [facility]. Patient is from home. Patient is normally AOx4 and ambulatory however, patient is AOx3 and non-ambulatory due to weakness. Patient began to have altered mental status 4 days ago and has been worsening, however today the weakness has gotten severe. Patient family member is in route.  Patient started amoxicillin 2x  Days ago and had a negative rapid COVID two days ago.   Patient is cancer patient and is seen at [facility].

## 2019-07-31 NOTE — ED Provider Notes (Addendum)
Heather Olson DEPT Provider Note   CSN: [id number] Arrival date & time: 07/31/19  [id number]     History Chief Complaint  Patient presents with  . Weakness  . Altered Mental Status    Heather Olson is a 86 y.o. female.  86 y.o female with a PMH of GERD, HTN, Polymyositis, MDS high grade presents to the ED via EMS for weakness. According to EMS report patient is usually oriented, is able to ambulate.  However, according to son patient has been bedbound along with somewhat confused.  The history is provided by the patient, medical records and a relative.  Weakness Altered Mental Status Associated symptoms: weakness        Past Medical History:  Diagnosis Date  . Arthritis   . GERD (gastroesophageal reflux disease)   . Headache(784.0)   . Hypertension   . Polymyositis ([place])   . Swelling of both ankles     Patient Active Problem List   Diagnosis Date Noted  . Port-A-Cath in place 02/22/2019  . MDS (myelodysplastic syndrome), high grade ([place]) 02/18/2019  . Postop check 06/15/2013  . Cholelithiases 05/11/2013    Past Surgical History:  Procedure Laterality Date  . ABDOMINAL HYSTERECTOMY    . APPENDECTOMY    . CHOLECYSTECTOMY N/A 05/28/2013   Procedure: LAPAROSCOPIC CHOLECYSTECTOMY ;  Surgeon: Gwenyth Ober, MD;  Location: [facility];  Service: General;  Laterality: N/A;  . TONSILLECTOMY       OB History   No obstetric history on file.     Family History  Problem Relation Age of Onset  . Diabetes Mother   . Cancer Daughter 13       ovarian cancer     Social History   Tobacco Use  . Smoking status: Never Smoker  . Smokeless tobacco: Never Used  Substance Use Topics  . Alcohol use: No  . Drug use: No    Home Medications Prior to Admission medications   Medication Sig Start Date End Date Taking? Authorizing Provider  acetaminophen (TYLENOL) 650 MG CR tablet Take 650 mg by mouth every 8 (eight) hours as needed for pain or fever.     Yes [provider]  acyclovir (ZOVIRAX) 200 MG capsule Take 400 mg by mouth 2 (two) times daily.  04/19/19  Yes [provider]  amoxicillin-clavulanate (AUGMENTIN) 875-125 MG tablet Take 1 tablet by mouth 2 (two) times daily. 07/29/19  Yes Truitt Merle, MD  ascorbic acid (VITAMIN C) 500 MG tablet Take 500 mg by mouth daily.   Yes [provider]  losartan (COZAAR) 25 MG tablet Take 50 mg by mouth daily.  03/10/19  Yes [provider]  Multiple Vitamin (MULTIVITAMIN WITH MINERALS) TABS tablet Take 1 tablet by mouth daily.   Yes [provider]  omeprazole (PRILOSEC) 40 MG capsule Take 40 mg by mouth daily.    Yes [provider]  predniSONE (DELTASONE) 5 MG tablet Take 5 mg by mouth daily.   Yes [provider]  divalproex (DEPAKOTE) 250 MG DR tablet Take 250-500 mg by mouth See admin instructions. Take 2 tablets (500 mg) by mouth every morning and 1 tablet (250 mg) at night - For occipital neuralgia 03/13/18 03/13/19  [provider]  traMADol (ULTRAM) 50 MG tablet Take 0.5 tablets (25 mg total) by mouth every 6 (six) hours as needed for severe pain. Patient not taking: Reported on 07/31/2019 12/13/17   Tereasa Coop, PA-C    Allergies  Butalbital-apap-caffeine, Imuran [azathioprine], and Lisinopril  Review of Systems   Review of Systems  Unable to perform ROS: Mental status change  Neurological: Positive for weakness.    Physical Exam Updated Vital Signs BP (!) 98/43   Pulse (!) 102   Temp (S) (!) 100.9 F (38.3 C) (Rectal)   Resp (!) 24   Ht 5\' 4"  (1.626 m)   Wt 87.8 kg   SpO2 97%   BMI 33.23 kg/m   Physical Exam Vitals and nursing note reviewed.  Constitutional:      Appearance: Normal appearance. She is ill-appearing. She is not toxic-appearing.  HENT:     Head: Normocephalic and atraumatic.     Nose: Nose normal.     Mouth/Throat:     Mouth: Mucous membranes are moist.  Eyes:     Pupils: Pupils are  equal, round, and reactive to light.  Pulmonary:     Effort: Pulmonary effort is normal.     Breath sounds: No rhonchi.  Abdominal:     General: Abdomen is flat.     ED Results / Procedures / Treatments   Labs (all labs ordered are listed, but only abnormal results are displayed) Labs Reviewed  COMPREHENSIVE METABOLIC PANEL - Abnormal; Notable for the following components:      Result Value   CO2 20 (*)    Glucose, Bld 134 (*)    BUN 41 (*)    Creatinine, Ser 2.78 (*)    Albumin 2.6 (*)    GFR calc non Af Amer 15 (*)    GFR calc Af Amer 17 (*)    All other components within normal limits  CBC WITH DIFFERENTIAL/PLATELET - Abnormal; Notable for the following components:   WBC 18.2 (*)    RBC 3.36 (*)    Hemoglobin 9.9 (*)    HCT 29.3 (*)    RDW 16.6 (*)    Platelets 14 (*)    Neutro Abs 10.3 (*)    Monocytes Absolute 4.4 (*)    Abs Immature Granulocytes 1.76 (*)    All other components within normal limits  APTT - Abnormal; Notable for the following components:   aPTT 39 (*)    All other components within normal limits  PROTIME-INR - Abnormal; Notable for the following components:   Prothrombin Time 18.5 (*)    INR 1.6 (*)    All other components within normal limits  TROPONIN I (HIGH SENSITIVITY) - Abnormal; Notable for the following components:   Troponin I (High Sensitivity) 218 (*)    All other components within normal limits  CULTURE, BLOOD (ROUTINE X 2)  CULTURE, BLOOD (ROUTINE X 2)  URINE CULTURE  LACTIC ACID, PLASMA  LIPASE, BLOOD  URINALYSIS, ROUTINE W REFLEX MICROSCOPIC  TOXOPLASMA ANTIBODIES- IGG AND  IGM  POC SARS CORONAVIRUS 2 AG -  ED  CBG MONITORING, ED  TYPE AND SCREEN  TROPONIN I (HIGH SENSITIVITY)    EKG None  Radiology DG Chest 2 View  Result Date: 07/29/2019 CLINICAL DATA:  Cough for 1 week X EXAM: CHEST - 2 VIEW COMPARISON:  05/29/2019 FINDINGS: Right-sided chest port remains in place with distal tip terminating at the superior  cavoatrial junction. The the heart size and mediastinal contours are within normal limits. Both lungs are clear. The visualized skeletal structures are unremarkable. IMPRESSION: No active cardiopulmonary disease. Electronically Signed   By: Davina Poke D.O.   On: 07/29/2019 13:54   DG Chest Portable 1 View  Result Date: 07/31/2019 CLINICAL  DATA:  Altered mental status and weakness EXAM: PORTABLE CHEST 1 VIEW COMPARISON:  07/29/2019; 05/29/2019 FINDINGS: Grossly unchanged cardiac silhouette and mediastinal contours. Stable positioning of support apparatus. No focal airspace opacities. No pleural effusion or pneumothorax. No evidence of edema. No acute osseous abnormalities. IMPRESSION: No acute cardiopulmonary disease. Electronically Signed   By: Sandi Mariscal M.D.   On: 07/31/2019 10:38    Procedures .Critical Care Performed by: Janeece Fitting, PA-C Authorized by: Janeece Fitting, PA-C   Critical care provider statement:    Critical care time (minutes):  35   Critical care start time:  07/31/2019 1:00 PM   Critical care end time:  07/31/2019 1:40 PM   Critical care time was exclusive of:  Separately billable procedures and treating other patients   Critical care was necessary to treat or prevent imminent or life-threatening deterioration of the following conditions:  Sepsis   Critical care was time spent personally by me on the following activities:  Blood draw for specimens, development of treatment plan with patient or surrogate, discussions with consultants, evaluation of patient's response to treatment, examination of patient, obtaining history from patient or surrogate, ordering and performing treatments and interventions, ordering and review of laboratory studies, ordering and review of radiographic studies, pulse oximetry, re-evaluation of patient's condition and review of old charts   (including critical care time)  Medications Ordered in ED Medications  metroNIDAZOLE (FLAGYL) IVPB 500 mg  (500 mg Intravenous New Bag/Given 07/31/19 1315)  sodium chloride 0.9 % bolus 500 mL (has no administration in time range)  ceFEPIme (MAXIPIME) 2 g in sodium chloride 0.9 % 100 mL IVPB (0 g Intravenous Stopped 07/31/19 1147)  vancomycin (VANCOCIN) IVPB 1000 mg/200 mL premix (0 mg Intravenous Stopped 07/31/19 1315)  acetaminophen (TYLENOL) tablet 1,000 mg (1,000 mg Oral Given 07/31/19 1107)  sodium chloride 0.9 % bolus 500 mL (500 mLs Intravenous New Bag/Given 07/31/19 1315)    ED Course  I have reviewed the triage vital signs and the nursing notes.  Pertinent labs & imaging results that were available during my care of the patient were reviewed by me and considered in my medical decision making (see chart for details).  Clinical Course as of Jul 30 1338  Sat Jul 31, 2019  1043 Rectal reading, provided with tylenol.  TempMarland Kitchen)(S): 102.9 F (39.4 C) [JS]  1242 Troponin I (High Sensitivity)(!!): 218 [JS]  1245 WBC(!): 18.2 [JS]    Clinical Course User Index [JS] Janeece Fitting, PA-C   MDM Rules/Calculators/A&P   Patient with a past medical history of MDS presents to the ED via EMS for weakness.  According to EMS report, patient is usually ambulatory, alert and oriented x4 however this has seemed to decline in the past 2 to 3 days.  According to son who provided most of the history, he reports mother was recently treated for an infection.  According to her chart which I have extensively review.  Patient is currently followed by Dr. Annamaria Boots of oncology, her last visit was July 29, 2019, and this visit patient reported a cough, sore throat, fatigue.  Main suspicion was for a URI.  She was placed on Augmentin 875 mg twice daily for the next 7 days.  Her x-ray on this visit was also negative.  Patient did have a negative Covid test on the 31st.  In addition, patient is followed by Dr. Leretha Pol at [facility] for her MDS.  She arrived in the ED febrile with temperature of 102.9, heart rate slightly elevated at  107,  there is no hypoxia but some tachypnea.  Code sepsis was activated, will cover patient with broad-spectrum antibiotics.  We will also place a call for son in order to obtain collateral information.  10:31 AM Spoke to Dierks son, he reported she became incoherent about 2-3 days ago. They reported she has a bad infection, unknown source. She was febrile overnight Tmax 101, she was placed on Augmentin by Dr. Annamaria Boots.  He has been providing her with tylenol, he felt that she was unable to pick her up, she had no strength in her body.She is usually ambulatory at baseline.  CMP with no electrolyte abnormality.  Creatinine level is elevated at 2.78, this is worse than her previous visit.  CBC is markable for a white count of 18.2, globin is 9.9, within her baseline.  Platelets are 14, this is decreased down her prior visits.  She does receive transfusions, last transfusion was sometime in December.  Her treatment has been hold off as patient is currently not tolerating it well according to oncology notes.  Troponin is remarkable at 218, will place call for cardiology recommendations.  Covid negative, along with xray without any consolidation or signs of infection.   12:45 PM Will place call for Cardiology pending recommendations.   Patient was noted to be somewhat hypotensive, provided with 500 mils of fluid.  Upon recheck patient's blood pressure has normalized.Fever is improving with a temp of 100.9   1:17 PM Spoke to Cardiology who reviewed EKG and advised to not heparin patient at this time. Will be placed on consult list. Will place call for hospitalist admission.   1:36 PM Spoke to hospitalist service who will admit patient for further management.   3:22 PM Spoke to Dr. Earlie Server, oncology who recommended inpatient platelet transfusion, supportive care.  She will be following patient.  Portions of this note were generated with [employer]. Dictation errors may occur despite best attempts  at proofreading.  Final Clinical Impression(s) / ED Diagnoses Final diagnoses:  Weakness  Sepsis due to undetermined organism [facility])    Rx / [place] Orders ED Discharge Orders    None       Janeece Fitting, PA-C 07/31/19 [place], Karole Oo, PA-C 07/31/19 1340    Janeece Fitting, PA-C 07/31/19 1522    Margette Fast, MD 07/31/19 2037

## 2019-07-31 NOTE — H&P (Signed)
.  History and Physical    Heather Olson [id number] DOB: [birth year] DOA: 07/31/2019  PCP: Johnston Ebbs, MD  Patient coming from: Home   Chief Complaint: Weakness  HPI: Heather Olson is a 86 y.o. female with medical history significant of MDS, myositis. Presenting with weakness for 3 days. Hx per son. He reports that Thursday he noted his mom was fatigued and lethargic. He took her to her oncologist, who was concerned for infection. Was given abx. He noticed over the next two days that she had increased weakness to the point that she couldn't walk or use her rollator. He noticed that she was confused. He noticed that she had frequent diarrhea. He became concerned because she had fevers and brought her to [facility].  ED Course: Found to be septic. Cx pending. Started on vanc/cefepime. Found to have elevated troponin. Cardiology notified. Her onco was notified. Both teams following. TRH called for admission.   Review of Systems: Unable to obtain from her d/t mentation.    Past Medical History:  Diagnosis Date  . Arthritis   . GERD (gastroesophageal reflux disease)   . Headache(784.0)   . Hypertension   . Polymyositis ([place])   . Swelling of both ankles     Past Surgical History:  Procedure Laterality Date  . ABDOMINAL HYSTERECTOMY    . APPENDECTOMY    . CHOLECYSTECTOMY N/A 05/28/2013   Procedure: LAPAROSCOPIC CHOLECYSTECTOMY ;  Surgeon: Gwenyth Ober, MD;  Location: [facility];  Service: General;  Laterality: N/A;  . TONSILLECTOMY       reports that she has never smoked. She has never used smokeless tobacco. She reports that she does not drink alcohol or use drugs.  Allergies  Allergen Reactions  . Butalbital-Apap-Caffeine Itching    Patient reports itching after taking x 1 tablet, so she stopped taking medication  . Imuran [Azathioprine] Other (See Comments)    Luekemia  . Lisinopril Cough    Family History  Problem Relation Age of Onset  . Diabetes Mother   . Cancer  Daughter 50       ovarian cancer     Prior to Admission medications   Medication Sig Start Date End Date Taking? Authorizing Provider  acetaminophen (TYLENOL) 650 MG CR tablet Take 650 mg by mouth every 8 (eight) hours as needed for pain or fever.    Yes [provider]  acyclovir (ZOVIRAX) 200 MG capsule Take 400 mg by mouth 2 (two) times daily.  04/19/19  Yes [provider]  amoxicillin-clavulanate (AUGMENTIN) 875-125 MG tablet Take 1 tablet by mouth 2 (two) times daily. 07/29/19  Yes Truitt Merle, MD  ascorbic acid (VITAMIN C) 500 MG tablet Take 500 mg by mouth daily.   Yes [provider]  losartan (COZAAR) 25 MG tablet Take 50 mg by mouth daily.  03/10/19  Yes [provider]  Multiple Vitamin (MULTIVITAMIN WITH MINERALS) TABS tablet Take 1 tablet by mouth daily.   Yes [provider]  omeprazole (PRILOSEC) 40 MG capsule Take 40 mg by mouth daily.    Yes [provider]  predniSONE (DELTASONE) 5 MG tablet Take 5 mg by mouth daily.   Yes [provider]  divalproex (DEPAKOTE) 250 MG DR tablet Take 250-500 mg by mouth See admin instructions. Take 2 tablets (500 mg) by mouth every morning and 1 tablet (250 mg) at night - For occipital neuralgia 03/13/18 03/13/19  [provider]  traMADol (ULTRAM) 50 MG tablet Take 0.5 tablets (25  mg total) by mouth every 6 (six) hours as needed for severe pain. Patient not taking: Reported on 07/31/2019 12/13/17   Tereasa Coop, PA-C    Physical Exam: Vitals:   07/31/19 1300 07/31/19 1330 07/31/19 1337 07/31/19 1511  BP: (!) 114/57 (!) 98/43  (!) 118/53  Pulse: (!) 101 (!) 102  91  Resp: (!) 22 (!) 24  20  Temp:   (S) (!) 100.9 F (38.3 C)   TempSrc:   (S) Rectal   SpO2: 99% 97%  100%  Weight:      Height:        Constitutional: 86 y.o. female NAD, calm, comfortable Vitals:   07/31/19 1300 07/31/19 1330 07/31/19 1337 07/31/19 1511  BP: (!) 114/57 (!) 98/43  (!) 118/53  Pulse:  (!) 101 (!) 102  91  Resp: (!) 22 (!) 24  20  Temp:   (S) (!) 100.9 F (38.3 C)   TempSrc:   (S) Rectal   SpO2: 99% 97%  100%  Weight:      Height:       -General: 86 y.o. female resting in bed in NAD Eyes: PERRL, normal sclera ENMT: Nares patent w/o discharge, orophaynx clear, dentition normal, ears w/o discharge/lesions/ulcers Cardiovascular: tachy, +S1, S2, no m/g/r, equal pulses throughout Respiratory: CTABL, no w/r/r, normal WOB GI: BS+, NDNT, no masses noted, no organomegaly noted MSK: No e/c/c Skin: No rashes, bruises, ulcerations noted Neuro: Alert to name, follows commands, she is hard of hearing Psyc: anxious but cooperative    Labs on Admission: I have personally reviewed following labs and imaging studies  CBC: Recent Labs  Lab 07/29/19 1149 07/31/19 0914  WBC 8.8 18.2*  NEUTROABS 5.1 10.3*  HGB 9.7* 9.9*  HCT 28.2* 29.3*  MCV 84.4 87.2  PLT 20* 14*   Basic Metabolic Panel: Recent Labs  Lab 07/31/19 0914  NA 138  K 4.7  CL 105  CO2 20*  GLUCOSE 134*  BUN 41*  CREATININE 2.78*  CALCIUM 9.0   GFR: Estimated Creatinine Clearance: 16.1 mL/min (A) (by C-G formula based on SCr of 2.78 mg/dL (H)). Liver Function Tests: Recent Labs  Lab 07/31/19 0914  AST 35  ALT 24  ALKPHOS 71  BILITOT 0.8  PROT 6.5  ALBUMIN 2.6*   Recent Labs  Lab 07/31/19 0936  LIPASE 27   No results for input(s): AMMONIA in the last 168 hours. Coagulation Profile: Recent Labs  Lab 07/31/19 1000  INR 1.6*   Cardiac Enzymes: No results for input(s): CKTOTAL, CKMB, CKMBINDEX, TROPONINI in the last 168 hours. BNP (last 3 results) No results for input(s): PROBNP in the last 8760 hours. HbA1C: No results for input(s): HGBA1C in the last 72 hours. CBG: Recent Labs  Lab 07/31/19 1010  GLUCAP 81   Lipid Profile: No results for input(s): CHOL, HDL, LDLCALC, TRIG, CHOLHDL, LDLDIRECT in the last 72 hours. Thyroid Function Tests: No results for input(s): TSH,  T4TOTAL, FREET4, T3FREE, THYROIDAB in the last 72 hours. Anemia Panel: No results for input(s): VITAMINB12, FOLATE, FERRITIN, TIBC, IRON, RETICCTPCT in the last 72 hours. Urine analysis:    Component Value Date/Time   COLORURINE YELLOW 07/31/2019 1436   APPEARANCEUR HAZY (A) 07/31/2019 1436   LABSPEC 1.018 07/31/2019 1436   PHURINE 5.0 07/31/2019 1436   GLUCOSEU NEGATIVE 07/31/2019 1436   HGBUR NEGATIVE 07/31/2019 1436   BILIRUBINUR NEGATIVE 07/31/2019 1436   KETONESUR 5 (A) 07/31/2019 1436   PROTEINUR 30 (A) 07/31/2019 1436   UROBILINOGEN 0.2  08/13/2014 2159   NITRITE NEGATIVE 07/31/2019 1436   LEUKOCYTESUR NEGATIVE 07/31/2019 1436    Radiological Exams on Admission: DG Chest Portable 1 View  Result Date: 07/31/2019 CLINICAL DATA:  Altered mental status and weakness EXAM: PORTABLE CHEST 1 VIEW COMPARISON:  07/29/2019; 05/29/2019 FINDINGS: Grossly unchanged cardiac silhouette and mediastinal contours. Stable positioning of support apparatus. No focal airspace opacities. No pleural effusion or pneumothorax. No evidence of edema. No acute osseous abnormalities. IMPRESSION: No acute cardiopulmonary disease. Electronically Signed   By: Sandi Mariscal M.D.   On: 07/31/2019 10:38    Assessment/Plan Active Problems:   MDS (myelodysplastic syndrome), high grade (HCC)   Sepsis (HCC)   Weakness   Diarrhea   AKI (acute kidney injury) ([place])   Elevated troponin   CKD (chronic kidney disease), stage III   Sepsis Diarrhea     - unknown cause     - fevers, tachy, elevated white count, AKI     - UCx pending, Bld Cx ordered     - son reports 3 days of diarrhea, check c diff and GI panel     - currently on vanc/cefepime; pharm to dose.     - she has a port; infected? May need to have this removed and tip cultured; let's see if we can get bld cx first  AKI on CKD3a     - fluids, watch nephrotoxins  Elevated troponins     - cards onboard, appreciate assistance     - trend trp for now; check  EKG  Weakness     - likely d/t above     - PT/OT consult  MDS Thrombocytopenia     - No signs of frank bleed     - onco consulted per ED; appreciate assistance.   DVT prophylaxis: SCDs  Code Status: DNR (confirmed with son)  Family Communication: With Son by phone  Disposition Plan: TBD  Consults called: Cardiology and Oncology called by ED  Admission status: Inpatient d/t need for IVF; IV abx, hemodynamic instability, ongoing w/u.     Jonnie Finner DO [facility]  If 7PM-7AM, please contact night-coverage [email or web address]  07/31/2019, 4:04 PM

## 2019-07-31 NOTE — ED Notes (Signed)
RN has spoken with Admitting MD and informed MD regarding Blood cultures.

## 2019-07-31 NOTE — Progress Notes (Signed)
A consult was received from an ED physician for vanc/cefepime per pharmacy dosing.  The patient's profile has been reviewed for ht/wt/allergies/indication/available labs.   A one time order has been placed for vanc 1g and cefepime 2g.  Further antibiotics/pharmacy consults should be ordered by admitting physician if indicated.                       Thank you, Kara Mead 07/31/2019  10:34 AM

## 2019-08-01 ENCOUNTER — Inpatient Hospital Stay (HOSPITAL_COMMUNITY): Payer: Medicare Other

## 2019-08-01 DIAGNOSIS — R6520 Severe sepsis without septic shock: Secondary | ICD-10-CM

## 2019-08-01 DIAGNOSIS — M3320 Polymyositis, organ involvement unspecified: Secondary | ICD-10-CM

## 2019-08-01 DIAGNOSIS — N1831 Chronic kidney disease, stage 3a: Secondary | ICD-10-CM

## 2019-08-01 DIAGNOSIS — R778 Other specified abnormalities of plasma proteins: Secondary | ICD-10-CM

## 2019-08-01 DIAGNOSIS — I10 Essential (primary) hypertension: Secondary | ICD-10-CM

## 2019-08-01 DIAGNOSIS — I249 Acute ischemic heart disease, unspecified: Secondary | ICD-10-CM

## 2019-08-01 LAB — TOXOPLASMA ANTIBODIES- IGG AND  IGM
Toxoplasma Antibody- IgM: <3 AU/mL (ref 0.0–7.9)
Toxoplasma IgG Ratio: 261 IU/mL — ABNORMAL HIGH (ref 0.0–7.1)

## 2019-08-01 LAB — ECHOCARDIOGRAM COMPLETE
Height: 64 in
Weight: 3097.02 oz

## 2019-08-01 LAB — PROTIME-INR
INR: 1.7 — ABNORMAL HIGH (ref 0.8–1.2)
Prothrombin Time: 20.3 seconds — ABNORMAL HIGH (ref 11.4–15.2)

## 2019-08-01 LAB — BASIC METABOLIC PANEL
Anion gap: 10 (ref 5–15)
BUN: 41 mg/dL — ABNORMAL HIGH (ref 8–23)
CO2: 18 mmol/L — ABNORMAL LOW (ref 22–32)
Calcium: 7.9 mg/dL — ABNORMAL LOW (ref 8.9–10.3)
Chloride: 110 mmol/L (ref 98–111)
Creatinine, Ser: 2.6 mg/dL — ABNORMAL HIGH (ref 0.44–1.00)
GFR calc Af Amer: 19 mL/min — ABNORMAL LOW (ref >60)
GFR calc non Af Amer: 16 mL/min — ABNORMAL LOW (ref >60)
Glucose, Bld: 145 mg/dL — ABNORMAL HIGH (ref 70–99)
Potassium: 4.4 mmol/L (ref 3.5–5.1)
Sodium: 138 mmol/L (ref 135–145)

## 2019-08-01 LAB — CK: Total CK: 81 U/L (ref 38–234)

## 2019-08-01 LAB — CBC
HCT: 23.7 % — ABNORMAL LOW (ref 36.0–46.0)
Hemoglobin: 7.5 g/dL — ABNORMAL LOW (ref 12.0–15.0)
MCH: 28.6 pg (ref 26.0–34.0)
MCHC: 31.6 g/dL (ref 30.0–36.0)
MCV: 90.5 fL (ref 80.0–100.0)
Platelets: 10 10*3/uL — CL (ref 150–400)
RBC: 2.62 MIL/uL — ABNORMAL LOW (ref 3.87–5.11)
RDW: 17.1 % — ABNORMAL HIGH (ref 11.5–15.5)
WBC: 19.5 10*3/uL — ABNORMAL HIGH (ref 4.0–10.5)
nRBC: 0 % (ref 0.0–0.2)

## 2019-08-01 LAB — PROCALCITONIN: Procalcitonin: 18.8 ng/mL

## 2019-08-01 LAB — URINE CULTURE: Culture: NO GROWTH

## 2019-08-01 LAB — GLUCOSE, CAPILLARY: Glucose-Capillary: 96 mg/dL (ref 70–99)

## 2019-08-01 LAB — CORTISOL-AM, BLOOD: Cortisol - AM: 23.6 ug/dL — ABNORMAL HIGH (ref 6.7–22.6)

## 2019-08-01 LAB — TROPONIN I (HIGH SENSITIVITY): Troponin I (High Sensitivity): 227 ng/L (ref <18)

## 2019-08-01 LAB — SARS CORONAVIRUS 2 (TAT 6-24 HRS): SARS Coronavirus 2: NEGATIVE

## 2019-08-01 MED ORDER — PREDNISONE 20 MG PO TABS
40.0000 mg | ORAL_TABLET | Freq: Every day | ORAL | Status: DC
  Filled 2019-08-01: qty 2

## 2019-08-01 MED ORDER — GUAIFENESIN-DM 100-10 MG/5ML PO SYRP
5.0000 mL | ORAL_SOLUTION | ORAL | Status: DC | PRN
  Administered 2019-08-01: 5 mL via ORAL
  Filled 2019-08-01: qty 10

## 2019-08-01 MED ORDER — LIP MEDEX EX OINT
TOPICAL_OINTMENT | CUTANEOUS | Status: DC | PRN
  Filled 2019-08-01: qty 7

## 2019-08-01 MED ORDER — SODIUM CHLORIDE 0.9% FLUSH
10.0000 mL | Freq: Two times a day (BID) | INTRAVENOUS | Status: DC
  Administered 2019-08-03: 20 mL

## 2019-08-01 MED ORDER — MORPHINE SULFATE (PF) 2 MG/ML IV SOLN
0.5000 mg | INTRAVENOUS | Status: DC | PRN
  Administered 2019-08-01 – 2019-08-02 (×5): 0.5 mg via INTRAVENOUS
  Filled 2019-08-01 (×5): qty 1

## 2019-08-01 MED ORDER — PREDNISONE 5 MG PO TABS: 35.0000 mg | ORAL_TABLET | Freq: Once | ORAL | Status: DC

## 2019-08-01 MED ORDER — SODIUM CHLORIDE 0.9% FLUSH: 10.0000 mL | INTRAVENOUS | Status: DC | PRN

## 2019-08-01 MED ORDER — HYDROMORPHONE HCL 1 MG/ML IJ SOLN
0.5000 mg | Freq: Once | INTRAMUSCULAR | Status: AC
  Administered 2019-08-01: 0.5 mg via INTRAVENOUS
  Filled 2019-08-01: qty 0.5

## 2019-08-01 MED ORDER — SODIUM CHLORIDE 0.9 % IV SOLN: Freq: Once | INTRAVENOUS | Status: AC

## 2019-08-01 MED ORDER — LIDOCAINE 5 % EX PTCH
2.0000 | MEDICATED_PATCH | CUTANEOUS | Status: DC
  Administered 2019-08-01 – 2019-08-05 (×5): 2 via TRANSDERMAL
  Filled 2019-08-01 (×5): qty 2

## 2019-08-01 MED ORDER — SODIUM CHLORIDE 0.9% IV SOLUTION: Freq: Once | INTRAVENOUS | Status: DC

## 2019-08-01 MED ORDER — IPRATROPIUM-ALBUTEROL 0.5-2.5 (3) MG/3ML IN SOLN
3.0000 mL | RESPIRATORY_TRACT | Status: DC | PRN
  Administered 2019-08-05: 3 mL via RESPIRATORY_TRACT
  Filled 2019-08-01: qty 3

## 2019-08-01 NOTE — Evaluation (Signed)
Physical Therapy Evaluation Patient Details Name: Heather Olson MRN: [id number] DOB: 1933-06-03 Today's Date: 08/01/2019   History of Present Illness  86 yo female admitted with sepsis. Hx of MDS, myositis,hearing loss  Clinical Impression  Bed level eval only. Eval limited by lethargy. Pt had difficulty keeping eyes open during session. She participated in some UE and LE ROM exercises. Pt is also very HOH! Unable to get any PLOF info. No family present during session. At this time, recommendation is for SNF. Will follow and progress activity as able.     Follow Up Recommendations SNF    Equipment Recommendations  None recommended by PT    Recommendations for Other Services OT consult     Precautions / Restrictions Precautions Precautions: Fall Restrictions Weight Bearing Restrictions: No      Mobility  Bed Mobility                  Transfers                    Ambulation/Gait                Stairs            Wheelchair Mobility    Modified Rankin (Stroke Patients Only)       Balance                                             Pertinent Vitals/Pain Pain Assessment: Faces Faces Pain Scale: No hurt    Home Living                        Prior Function           Comments: unsure-pt unable to provide history at time of eval     Hand Dominance        Extremity/Trunk Assessment   Upper Extremity Assessment Upper Extremity Assessment: Defer to OT evaluation    Lower Extremity Assessment Lower Extremity Assessment: Difficult to assess due to impaired cognition       Communication   Communication: HOH  Cognition Arousal/Alertness: Lethargic Behavior During Therapy: WFL for tasks assessed/performed Overall Cognitive Status: Difficult to assess                                        General Comments      Exercises General Exercises - Upper Extremity Shoulder Flexion:  AAROM;Both;5 reps;Supine General Exercises - Lower Extremity Heel Slides: AAROM;Both;5 reps;Supine   Assessment/Plan    PT Assessment Patient needs continued PT services  PT Problem List Decreased strength;Decreased mobility;Decreased activity tolerance;Decreased balance;Decreased cognition       PT Treatment Interventions DME instruction;Gait training;Therapeutic activities;Therapeutic exercise;Patient/family education;Balance training;Functional mobility training    PT Goals (Current goals can be found in the Care Plan section)  Acute Rehab PT Goals Patient Stated Goal: none stated PT Goal Formulation: Patient unable to participate in goal setting Time For Goal Achievement: 08/15/19 Potential to Achieve Goals: Fair    Frequency Min 3X/week   Barriers to discharge        Co-evaluation               AM-PAC PT "6 Clicks" Mobility  Outcome Measure Help needed turning from your back  to your side while in a flat bed without using bedrails?: Total Help needed moving from lying on your back to sitting on the side of a flat bed without using bedrails?: Total Help needed moving to and from a bed to a chair (including a wheelchair)?: Total Help needed standing up from a chair using your arms (e.g., wheelchair or bedside chair)?: Total Help needed to walk in hospital room?: Total Help needed climbing 3-5 steps with a railing? : Total 6 Click Score: 6    End of Session   Activity Tolerance: Patient limited by lethargy Patient left: in bed;with call bell/phone within reach;with bed alarm set   PT Visit Diagnosis: Muscle weakness (generalized) (M62.81);Other abnormalities of gait and mobility (R26.89);Difficulty in walking, not elsewhere classified (R26.2)    Time: [id number] PT Time Calculation (min) (ACUTE ONLY): 8 min   Charges:   PT Evaluation $PT Eval Moderate Complexity: 1 Mod            Janay Canan P, PT Acute Rehabilitation

## 2019-08-01 NOTE — Progress Notes (Addendum)
Pt was red in MEWS when she arrived from the ED d/t her HR, Resp. and her Temp.Pt given 650 mg Tylenol @ [id number], blanket and socks were removed and next set of VS were better regarding her Temp. Her HR cont. to be elevated between 104 and 111. And resp were consistently between 20 and 26. Then pt's temp began to increase. On-call Dr Humphrey Rolls was contacted and ordered a L bolus of NS. Pt continued to be in Yellow MEWS d/t resp. and Temperature. Pt will continue to be monitored.

## 2019-08-01 NOTE — Progress Notes (Signed)
Cardiology Consultation:   Patient ID: Heather Olson MRN: [id number]; DOB: 1933/07/15  Admit date: 07/31/2019 Date of Consult: 08/01/2019  Primary Care Provider: Johnston Ebbs, MD Primary Cardiologist: No primary care provider on file. Dr. Flonnie Overman- [facility] Primary Electrophysiologist:  None    Patient Profile:   Heather Olson is a 86 y.o. female with a hx of diabetes, hypertension, polymyositis, CKD II, and MDS who is being seen today for the evaluation of elevated troponin at the request of Dr. Marylyn Ishihara.  History of Present Illness:   History provided by chart review.  Patient unable to answer questions.   Heather Olson presented with 3 days of weakness.  Her son noted that she was very fatigued and lethargic and took her to her oncologist who was concerned about infection.  She was treated with antibiotics but had worsening weakness to the point where she was unable to walk with her walker.  She was also confused and having diarrhea.  Therefore he brought her to [facility] for evaluation.  In the ED she was found to be febrile and septic.  WBC 18.2.  COVID-19 negative.  She was started on vancomycin and cefepime.  High-sensitivity troponin was elevated to 218-->245.  EKG revealed sinus rhythm with no acute ischemia.  Cardiology was consulted for further evaluation.  Heather Olson previously saw Dr. Flonnie Overman (Cardiologist, [facility]) 07/2016 for syncope and shortness of breath. She had an episode of syncope at church 02/2016.  She wore a Holter that was unremarkable.  She did have an episode of presyncope where she was found to have sinus rhythm.  Her symptoms were felt to be due to orthostatic hypotension and she was advised to stay hydrated.  Echo 08/30/2016 revealed LVEF greater than [id number], grade 1 diastolic dysfunction with mild LVH and was otherwise unremarkable.  It was unchanged 10/2018 when she was admitted for recurrent syncope and pancytopenia.  Heart Pathway Score:     Past Medical  History:  Diagnosis Date  . Arthritis   . GERD (gastroesophageal reflux disease)   . Headache(784.0)   . Hypertension   . Polymyositis ([place])   . Swelling of both ankles     Past Surgical History:  Procedure Laterality Date  . ABDOMINAL HYSTERECTOMY    . APPENDECTOMY    . CHOLECYSTECTOMY N/A 05/28/2013   Procedure: LAPAROSCOPIC CHOLECYSTECTOMY ;  Surgeon: Gwenyth Ober, MD;  Location: [place] [facility];  Service: General;  Laterality: N/A;  . TONSILLECTOMY       Home Medications:  Prior to Admission medications   Medication Sig Start Date End Date Taking? Authorizing Provider  acetaminophen (TYLENOL) 650 MG CR tablet Take 650 mg by mouth every 8 (eight) hours as needed for pain or fever.    Yes [provider]  acyclovir (ZOVIRAX) 200 MG capsule Take 400 mg by mouth 2 (two) times daily.  04/19/19  Yes [provider]  amoxicillin-clavulanate (AUGMENTIN) 875-125 MG tablet Take 1 tablet by mouth 2 (two) times daily. 07/29/19  Yes Truitt Merle, MD  ascorbic acid (VITAMIN C) 500 MG tablet Take 500 mg by mouth daily.   Yes [provider]  losartan (COZAAR) 25 MG tablet Take 50 mg by mouth daily.  03/10/19  Yes [provider]  Multiple Vitamin (MULTIVITAMIN WITH MINERALS) TABS tablet Take 1 tablet by mouth daily.   Yes [provider]  omeprazole (PRILOSEC) 40 MG capsule Take 40 mg by mouth daily.    Yes [provider]  predniSONE (DELTASONE)  5 MG tablet Take 5 mg by mouth daily.   Yes [provider]  divalproex (DEPAKOTE) 250 MG DR tablet Take 250-500 mg by mouth See admin instructions. Take 2 tablets (500 mg) by mouth every morning and 1 tablet (250 mg) at night - For occipital neuralgia 03/13/18 03/13/19  [provider]  traMADol (ULTRAM) 50 MG tablet Take 0.5 tablets (25 mg total) by mouth every 6 (six) hours as needed for severe pain. Patient not taking: Reported on 07/31/2019 12/13/17   Tereasa Coop, PA-C    Inpatient  Medications: Scheduled Meds: . acyclovir  400 mg Oral BID  . ascorbic acid  500 mg Oral Daily  . Chlorhexidine Gluconate Cloth  6 each Topical Daily  . docusate sodium  100 mg Oral BID  . multivitamin with minerals  1 tablet Oral Daily  . pantoprazole  40 mg Oral Daily  . predniSONE  5 mg Oral Daily  . sodium chloride flush  10-40 mL Intracatheter Q12H   Continuous Infusions: . sodium chloride 75 mL/hr at 07/31/19 2306  . ceFEPime (MAXIPIME) IV    . [START ON 08/02/2019] vancomycin     PRN Meds: acetaminophen **OR** acetaminophen, guaiFENesin-dextromethorphan, ipratropium-albuterol, ondansetron **OR** ondansetron (ZOFRAN) IV, sodium chloride flush  Allergies:    Allergies  Allergen Reactions  . Butalbital-Apap-Caffeine Itching    Patient reports itching after taking x 1 tablet, so she stopped taking medication  . Imuran [Azathioprine] Other (See Comments)    Olson  . Lisinopril Cough    Social History:   Social History   Socioeconomic History  . Marital status: Widowed    Spouse name: Not on file  . Number of children: 3  . Years of education: Not on file  . Highest education level: Not on file  Occupational History  . Not on file  Tobacco Use  . Smoking status: Never Smoker  . Smokeless tobacco: Never Used  Substance and Sexual Activity  . Alcohol use: No  . Drug use: No  . Sexual activity: Not Currently  Other Topics Concern  . Not on file  Social History Narrative  . Not on file   Social Determinants of Health   Financial Resource Strain:   . Difficulty of Paying Living Expenses: Not on file  Food Insecurity:   . Worried About Charity fundraiser in the Last Year: Not on file  . Ran Out of Food in the Last Year: Not on file  Transportation Needs:   . Lack of Transportation (Medical): Not on file  . Lack of Transportation (Non-Medical): Not on file  Physical Activity:   . Days of Exercise per Week: Not on file  . Minutes of Exercise per Session: Not  on file  Stress:   . Feeling of Stress : Not on file  Social Connections:   . Frequency of Communication with Friends and Family: Not on file  . Frequency of Social Gatherings with Friends and Family: Not on file  . Attends Religious Services: Not on file  . Active Member of Clubs or Organizations: Not on file  . Attends Archivist Meetings: Not on file  . Marital Status: Not on file  Intimate Partner Violence:   . Fear of Current or Ex-Partner: Not on file  . Emotionally Abused: Not on file  . Physically Abused: Not on file  . Sexually Abused: Not on file    Family History:    Family History  Problem Relation Age of Onset  .  Diabetes Mother   . Cancer Daughter 63       ovarian cancer      ROS:  Please see the history of present illness.   All other ROS reviewed and negative.     Physical Exam/Data:   Vitals:   08/01/19 0232 08/01/19 0351 08/01/19 0447 08/01/19 0927  BP: (!) 127/48 (!) 126/49 (!) 131/41 (!) 137/51  Pulse: 99 100 96 97  Resp: (!) 24 (!) 28 (!) 22 20  Temp: 98.7 F (37.1 C) 100.1 F (37.8 C) (!) 100.6 F (38.1 C) (!) 101.4 F (38.6 C)  TempSrc: Oral Oral Oral Axillary  SpO2: 100% 100% 100% 100%  Weight:      Height:        Intake/Output Summary (Last 24 hours) at 08/01/2019 1012 Last data filed at 08/01/2019 0900 Gross per 24 hour  Intake 1744.27 ml  Output 15 ml  Net 1729.27 ml   Last 3 Weights 07/31/2019 07/29/2019 07/20/2019  Weight (lbs) 193 lb 9 oz 193 lb 9.6 oz 193 lb 8 oz  Weight (kg) 87.8 kg 87.816 kg 87.771 kg   VS:  BP (!) 137/51   Pulse 97   Temp (!) 101.4 F (38.6 C) (Axillary)   Resp 20   Ht 5\' 4"  (1.626 m)   Wt 87.8 kg   SpO2 100%   BMI 33.23 kg/m  , BMI Body mass index is 33.23 kg/m. GENERAL:  Ill-appearing.  Moaning and asking "help me please." HEENT: Pupils equal round and reactive, fundi not visualized, oral mucosa unremarkable NECK:  No jugular venous distention, waveform within normal limits, carotid  upstroke brisk and symmetric, no bruits LUNGS:  Clear to auscultation bilaterally HEART:  RRR.  PMI not displaced or sustained,S1 and S2 within normal limits, no S3, no S4, no clicks, no rubs, no murmurs ABD:  Flat, positive bowel sounds normal in frequency in pitch, no bruits, no rebound, no guarding, no midline pulsatile mass, no hepatomegaly, no splenomegaly EXT:  2 plus pulses throughout, no edema, no cyanosis no clubbing SKIN:  No rashes no nodules NEURO:  Moves all extremities freely.   PSYCH:  Unable to assess.  Patient not answering questions.    EKG:  The EKG was personally reviewed and demonstrates:  Sinus rhythm.  Rate 81 bpm.  LVH. Telemetry:  Telemetry was personally reviewed and demonstrates:  Sinus rhythm.  PACs, PVCs.   Relevant CV Studies: Echo 10/2018: LVEF >55%.  Grade 1 diastolic dysfunction  Nuclear stress 2017: Negative.  LVEF wnl  Laboratory Data:  High Sensitivity Troponin:   Recent Labs  Lab 07/31/19 0936 07/31/19 1313  TROPONINIHS 218* 245*     Chemistry Recent Labs  Lab 07/31/19 0914  NA 138  K 4.7  CL 105  CO2 20*  GLUCOSE 134*  BUN 41*  CREATININE 2.78*  CALCIUM 9.0  GFRNONAA 15*  GFRAA 17*  ANIONGAP 13    Recent Labs  Lab 07/31/19 0914  PROT 6.5  ALBUMIN 2.6*  AST 35  ALT 24  ALKPHOS 71  BILITOT 0.8   Hematology Recent Labs  Lab 07/29/19 1149 07/31/19 0914  WBC 8.8 18.2*  RBC 3.34* 3.36*  HGB 9.7* 9.9*  HCT 28.2* 29.3*  MCV 84.4 87.2  MCH 29.0 29.5  MCHC 34.4 33.8  RDW 15.9* 16.6*  PLT 20* 14*   BNPNo results for input(s): BNP, PROBNP in the last 168 hours.  DDimer No results for input(s): DDIMER in the last 168 hours.   Radiology/Studies:  DG Chest 2 View  Result Date: 07/29/2019 CLINICAL DATA:  Cough for 1 week X EXAM: CHEST - 2 VIEW COMPARISON:  05/29/2019 FINDINGS: Right-sided chest port remains in place with distal tip terminating at the superior cavoatrial junction. The the heart size and mediastinal  contours are within normal limits. Both lungs are clear. The visualized skeletal structures are unremarkable. IMPRESSION: No active cardiopulmonary disease. Electronically Signed   By: Davina Poke D.O.   On: 07/29/2019 13:54   DG Chest Portable 1 View  Result Date: 07/31/2019 CLINICAL DATA:  Altered mental status and weakness EXAM: PORTABLE CHEST 1 VIEW COMPARISON:  07/29/2019; 05/29/2019 FINDINGS: Grossly unchanged cardiac silhouette and mediastinal contours. Stable positioning of support apparatus. No focal airspace opacities. No pleural effusion or pneumothorax. No evidence of edema. No acute osseous abnormalities. IMPRESSION: No acute cardiopulmonary disease. Electronically Signed   By: Sandi Mariscal M.D.   On: 07/31/2019 10:38       TIMI Risk Score for Unstable Angina or Non-ST Elevation MI:   The patient's TIMI risk score is 2, which indicates a 8% risk of all cause mortality, new or recurrent myocardial infarction or need for urgent revascularization in the next 14 days.   Assessment and Plan:   # Elevated troponin: # Demand ischemia: Heather Olson presented with sepsis.  Cardiac enzymes were elevated but relatively flat.  We will check 1 more high-sensitivity troponin to assess the trajectory.  EKG is without acute ischemic changes.  She is not answering questions at this time, but there is no report of chest pain.  She last had a stress test in 2017 that was normal.  Echo 10/2018 revealed normal systolic function and no wall motion abnormalities.  I suspect that her troponin is elevated due to demand ischemia.  We will repeat an echocardiogram.  Presuming her systolic function is within normal limits and there is no plan for an acute ischemic evaluation during this hospitalization.  She can follow-up with her primary cardiologist to determine if she needs repeat stress testing.  # Hypertension:  Home losartan on hold in the setting of sepsis.      For questions or updates, please contact  [place] Please consult [email or web address] for contact info under     Signed, Skeet Latch, MD  08/01/2019 10:12 AM

## 2019-08-01 NOTE — Progress Notes (Signed)
  Echocardiogram 2D Echocardiogram has been performed.  Heather Olson 08/01/2019, 12:01 PM

## 2019-08-01 NOTE — Progress Notes (Signed)
Critical lab troponin 227. Notified Dr. Hayes Ludwig. No new orders.

## 2019-08-01 NOTE — Progress Notes (Addendum)
Pt given 0.5 mg Morphine @ 1825. Pt's son visited today and states that pt has chronic back pain. Pt began moaning loudly @ 2000. When questioned re: her discomfort, pt had eyes open, but was unable to respond. On call notified for pain control. New prder for 0.5 mg Dilaudid once. Med administered to pt and pt rested. Pt was given a bath @ 0130 and VS were taken. Pt was Red in MEWS d/t a T of 102.6 axillary, RR 26, HR 103. 650 mg of Tylenol was given rectally @0135 .Ice packs were placed in pt's axillary region Bi. Pt had been bladder scanned because there had been no OP in Purewick/suction container, however pt was dry. Bladder scan performed which showed 235 mls of urine. CN briefed on pt status. RR nurse was called to assess, and On call was contacted regarding pt's condition. Pt was given a 500 ml bolus of NS per RR nurse. Pt urinated 175 mls. Pt continues to moan and call out, but does not respond when spoken to. Pt temp decreased and was 100.8 rectally @ 0427. Her RR and HR being greater than 100.00 have still kept her MEWS score in Red. On call did not respond.  RR nurse updated on pt's condition.  0545- Pt resting quietly at this time.

## 2019-08-01 NOTE — Progress Notes (Signed)
PROGRESS NOTE    Heather Olson  [id number] DOB: 29-Nov-1932 DOA: 07/31/2019 PCP: Johnston Ebbs, MD (Confirm with patient/family/NH records and if not entered, this HAS to be entered at [facility] point of entry. "No PCP" if truly none.)   Brief Narrative: (Start on day 1 of progress note - keep it brief and live) Heather Olson is a 86 y.o. female with medical history significant of MDS, myositis. Presenting with weakness for 3 days. Hx per son. He reports that Thursday he noted his mom was fatigued and lethargic. He took her to her oncologist, who was concerned for infection. Was given abx. He noticed over the next two days that she had increased weakness to the point that she couldn't walk or use her rollator. He noticed that she was confused. He noticed that she had frequent diarrhea. He became concerned because she had fevers and brought her to [facility].  ED Course: Found to be septic. Cx pending. Started on vanc/cefepime. Found to have elevated troponin. Cardiology notified. Her onco was notified. Both teams following. TRH called for admission.    Assessment & Plan:   Active Problems:   MDS (myelodysplastic syndrome), high grade (HCC)   Sepsis (HCC)   Weakness   Diarrhea   AKI (acute kidney injury) ([place])   Elevated troponin   CKD (chronic kidney disease), stage III  Sepsis Diarrhea     - unknown cause, stool sample still not provided but patient had been on Augmentin, Rx by her Oncologist a fews days prior to her presentation for tx of a sorethroat.      - fevers, tachy, elevated white count, AKI     - Follow up on UCx Bld Cx     - currently on vanc/cefepime; pharm to dose.     - she has a port; no signs of infection so far. Will continue monitoring.      -COVID 19 negative.      -Afebrile  AKI on CKD3a     - fluids, watch nephrotoxins. Increase rate of fluids.   Elevated troponins     - cards onboard, appreciate assistance     - Trp flat     - 2D echo with nl EF, grd1  diastolic   Weakness     - likely d/t above     - PT/OT consult  Polymyositis Likely exacerbated with current dehydration, CK wnl Will increased daily prednisone form 5mg  po to 40mg  po daily (will given extra dose today) Started low dose of morphine as needed for pain  MDS Thrombocytopenia, with critical lab of 10K. I called the on-call Oncologist, will transfuse 1 unit platelet per his recommendations. Patient to be seen by Oncology tomorrow as formal consult.      - No signs of frank bleed    DVT prophylaxis: SCDs  Code Status: DNR (confirmed with son)  Family Communication: With Son at the bedside  Disposition Plan: TBD  Consults called: Cardiology and Oncology  Admission status: Inpatient d/t need for IVF; IV abx, hemodynamic instability, ongoing w/u.   Subjective: Patient with no bleeding but persistent pain all over, with body ache. Fever curve trending down. No diarrhea.   Objective: Vitals:   08/01/19 0351 08/01/19 0447 08/01/19 0927 08/01/19 1241  BP: (!) 126/49 (!) 131/41 (!) 137/51 (!) 134/43  Pulse: 100 96 97 96  Resp: (!) 28 (!) 22 20 20   Temp: 100.1 F (37.8 C) (!) 100.6 F (38.1 C) (!) 101.4 F (38.6 C) (!) 100.8  F (38.2 C)  TempSrc: Oral Oral Axillary Oral  SpO2: 100% 100% 100% 100%  Weight:      Height:        Intake/Output Summary (Last 24 hours) at 08/01/2019 1658 Last data filed at 08/01/2019 1500 Gross per 24 hour  Intake 852.81 ml  Output --  Net 852.81 ml   Filed Weights   07/31/19 0919  Weight: 87.8 kg    Examination:  General exam: Appears calm and comfortable  Respiratory system: Clear to auscultation. Respiratory effort normal. Cardiovascular system: S1 & S2 heard, RRR. No JVD, murmurs, rubs, gallops or clicks. No pedal edema. Gastrointestinal system: Abdomen is nondistended, soft and nontender. No organomegaly or masses felt. Normal bowel sounds heard. Central nervous system: Alert and oriented. No focal neurological  deficits. Extremities: Symmetric 5 x 5 power. Skin: No rashes, lesions or ulcers Psychiatry: Judgement and insight appear normal. Mood & affect appropriate.     Data Reviewed: I have personally reviewed following labs and imaging studies  CBC: Recent Labs  Lab 07/29/19 1149 07/31/19 0914 08/01/19 1244  WBC 8.8 18.2* 19.5*  NEUTROABS 5.1 10.3*  --   HGB 9.7* 9.9* 7.5*  HCT 28.2* 29.3* 23.7*  MCV 84.4 87.2 90.5  PLT 20* 14* 10*   Basic Metabolic Panel: Recent Labs  Lab 07/31/19 0914 08/01/19 1244  NA 138 138  K 4.7 4.4  CL 105 110  CO2 20* 18*  GLUCOSE 134* 145*  BUN 41* 41*  CREATININE 2.78* 2.60*  CALCIUM 9.0 7.9*   GFR: Estimated Creatinine Clearance: 17.3 mL/min (A) (by C-G formula based on SCr of 2.6 mg/dL (H)). Liver Function Tests: Recent Labs  Lab 07/31/19 0914  AST 35  ALT 24  ALKPHOS 71  BILITOT 0.8  PROT 6.5  ALBUMIN 2.6*   Recent Labs  Lab 07/31/19 0936  LIPASE 27   No results for input(s): AMMONIA in the last 168 hours. Coagulation Profile: Recent Labs  Lab 07/31/19 1000 08/01/19 0500  INR 1.6* 1.7*   Cardiac Enzymes: Recent Labs  Lab 08/01/19 1306  CKTOTAL 81   BNP (last 3 results) No results for input(s): PROBNP in the last 8760 hours. HbA1C: No results for input(s): HGBA1C in the last 72 hours. CBG: Recent Labs  Lab 07/31/19 1010 08/01/19 0413  GLUCAP 81 96   Lipid Profile: No results for input(s): CHOL, HDL, LDLCALC, TRIG, CHOLHDL, LDLDIRECT in the last 72 hours. Thyroid Function Tests: No results for input(s): TSH, T4TOTAL, FREET4, T3FREE, THYROIDAB in the last 72 hours. Anemia Panel: No results for input(s): VITAMINB12, FOLATE, FERRITIN, TIBC, IRON, RETICCTPCT in the last 72 hours. Sepsis Labs: Recent Labs  Lab 07/31/19 0923 08/01/19 0500  PROCALCITON  --  18.80  LATICACIDVEN 1.6  --     Recent Results (from the past 240 hour(s))  Urine culture     Status: None   Collection Time: 07/31/19  2:36 PM    Specimen: In/Out Cath Urine  Result Value Ref Range Status   Specimen Description   Final    IN/OUT CATH URINE Performed at [facility], [place] [address], [place], [place] [address]    Special Requests   Final    NONE Performed at [facility], [place] [address] [place] [address]    Culture   Final    NO GROWTH Performed at [facility], [place] [address] [place], [place] [address]    Report Status 08/01/2019 FINAL  Final  SARS  CORONAVIRUS 2 (TAT 6-24 HRS) Nasopharyngeal Nasopharyngeal Swab     Status: None   Collection Time: 07/31/19 10:25 PM   Specimen: Nasopharyngeal Swab  Result Value Ref Range Status   SARS Coronavirus 2 NEGATIVE NEGATIVE Final    Comment: (NOTE) SARS-CoV-2 target nucleic acids are NOT DETECTED. The SARS-CoV-2 RNA is generally detectable in upper and lower respiratory specimens during the acute phase of infection. Negative results do not preclude SARS-CoV-2 infection, do not rule out co-infections with other pathogens, and should not be used as the sole basis for treatment or other patient management decisions. Negative results must be combined with clinical observations, patient history, and epidemiological information. The expected result is Negative. Fact Sheet for Patients: SugarRoll.be Fact Sheet for Healthcare Providers: https://www.woods-mathews.com/ This test is not yet approved or cleared by the [place] FDA and  has been authorized for detection and/or diagnosis of SARS-CoV-2 by FDA under an Emergency Use Authorization (EUA). This EUA will remain  in effect (meaning this test can be used) for the duration of the COVID-19 declaration under Section 56 4(b)(1) of the Act, 21 U.S.C. section 360bbb-3(b)(1), unless the authorization is terminated or revoked sooner. Performed at [facility], [place] [address] [place], [place] [address]           Radiology Studies: DG Chest Portable 1 View  Result Date: 07/31/2019 CLINICAL DATA:  Altered mental status and weakness EXAM: PORTABLE CHEST 1 VIEW COMPARISON:  07/29/2019; 05/29/2019 FINDINGS: Grossly unchanged cardiac silhouette and mediastinal contours. Stable positioning of support apparatus. No focal airspace opacities. No pleural effusion or pneumothorax. No evidence of edema. No acute osseous abnormalities. IMPRESSION: No acute cardiopulmonary disease. Electronically Signed   By: Sandi Mariscal M.D.   On: 07/31/2019 10:38   ECHOCARDIOGRAM COMPLETE  Result Date: 08/01/2019   ECHOCARDIOGRAM REPORT   Patient Name:   AURIYA GIVINS Date of Exam: 08/01/2019 Medical Rec #:  [id number]     Height:       64.0 in Accession #:    [id number]    Weight:       193.6 lb Date of Birth:  1933/03/23    BSA:          1.93 m Patient Age:    20 years      BP:           137/51 mmHg Patient Gender: F             HR:           101 bpm. Exam Location:  Inpatient Procedure: 2D Echo, Cardiac Doppler and Color Doppler Indications:    Acute Cornonary Syndrome I24.9  History:        Patient has no prior history of Echocardiogram examinations.                 Risk Factors:Non-Smoker. Sepsis. Elevated troponin.  Sonographer:    Paulita Fujita RDCS Referring Phys: [id number] [facility]  Sonographer Comments: Technically difficult study due to poor echo windows. IMPRESSIONS  1. Left ventricular ejection fraction, by visual estimation, is 70 to 75%. The left ventricle has hyperdynamic function. There is mildly increased left ventricular hypertrophy. Turbulent LVOT flow without obvious gradient.  2. Left ventricular diastolic parameters are consistent with Grade I diastolic dysfunction (impaired relaxation).  3. The left ventricle has no regional wall motion abnormalities.  4. Global right ventricle has normal systolic function.The right ventricular size is normal. No increase in right ventricular wall thickness.  5. Left atrial  size was normal.  6. Right atrial size was normal.  7. Mild mitral annular calcification.  8. The mitral valve is grossly normal. Trivial mitral valve regurgitation.  9. The tricuspid valve is grossly normal. 10. The aortic valve is tricuspid. Aortic valve regurgitation is not visualized. 11. The pulmonic valve was grossly normal. Pulmonic valve regurgitation is trivial. 12. The inferior vena cava is normal in size with greater than 50% respiratory variability, suggesting right atrial pressure of 3 mmHg. 13. TR signal is inadequate for assessing pulmonary artery systolic pressure. FINDINGS  Left Ventricle: Left ventricular ejection fraction, by visual estimation, is 70 to 75%. The left ventricle has hyperdynamic function. The left ventricle has no regional wall motion abnormalities. The left ventricular internal cavity size was the left ventricle is normal in size. There is mildly increased left ventricular hypertrophy. Left ventricular diastolic parameters are consistent with Grade I diastolic dysfunction (impaired relaxation). Right Ventricle: The right ventricular size is normal. No increase in right ventricular wall thickness. Global RV systolic function is has normal systolic function. Left Atrium: Left atrial size was normal in size. Right Atrium: Right atrial size was normal in size Pericardium: There is no evidence of pericardial effusion. Mitral Valve: The mitral valve is grossly normal. Mild mitral annular calcification. Trivial mitral valve regurgitation. Tricuspid Valve: The tricuspid valve is grossly normal. Tricuspid valve regurgitation is trivial. Aortic Valve: The aortic valve is tricuspid. Aortic valve regurgitation is not visualized. Mild aortic valve annular calcification. Pulmonic Valve: The pulmonic valve was grossly normal. Pulmonic valve regurgitation is trivial. Pulmonic regurgitation is trivial. Aorta: The aortic root is normal in size and structure. Venous: The inferior vena cava is normal  in size with greater than 50% respiratory variability, suggesting right atrial pressure of 3 mmHg. IAS/Shunts: No atrial level shunt detected by color flow Doppler.  LEFT VENTRICLE PLAX 2D LVOT diam:     1.70 cm  Diastology LVOT Area:     2.27 cm LV e' lateral:   5.87 cm/s                         LV E/e' lateral: 17.2                         LV e' medial:    5.11 cm/s                         LV E/e' medial:  19.8  LEFT ATRIUM             Index LA Vol (A2C):   24.2 ml 12.54 ml/m LA Vol (A4C):   36.0 ml 18.66 ml/m LA Biplane Vol: 31.1 ml 16.12 ml/m  AORTIC VALVE LVOT Vmax:   105.00 cm/s LVOT Vmean:  79.900 cm/s LVOT VTI:    0.248 m MITRAL VALVE MV Area (PHT): 1.63 cm              SHUNTS MV PHT:        134.85 msec           Systemic VTI:  0.25 m MV Decel Time: 465 msec              Systemic Diam: 1.70 cm MV E velocity: 101.00 cm/s 103 cm/s MV A velocity: 160.00 cm/s 70.3 cm/s MV E/A ratio:  0.63        1.5  Rozann Lesches MD Electronically signed by Rozann Lesches  MD Signature Date/Time: 08/01/2019/12:22:02 PM    Final         Scheduled Meds:  sodium chloride   Intravenous Once   acyclovir  400 mg Oral BID   ascorbic acid  500 mg Oral Daily   Chlorhexidine Gluconate Cloth  6 each Topical Daily   docusate sodium  100 mg Oral BID   lidocaine  2 patch Transdermal Q24H   multivitamin with minerals  1 tablet Oral Daily   pantoprazole  40 mg Oral Daily   [START ON 08/02/2019] predniSONE  40 mg Oral Daily   sodium chloride flush  10-40 mL Intracatheter Q12H   Continuous Infusions:  sodium chloride 75 mL/hr at 08/01/19 1020   ceFEPime (MAXIPIME) IV 2 g (08/01/19 1016)   [START ON 08/02/2019] vancomycin       LOS: 1 day    Time spent: 37 minutes with more than 50% of this time on patient counseling and treatment plan discussion.     Blain Pais, MD [facility]   If 7PM-7AM, please contact night-coverage [email or web address] Password [facility] 08/01/2019, 4:58 PM

## 2019-08-02 ENCOUNTER — Inpatient Hospital Stay (HOSPITAL_COMMUNITY): Payer: Medicare Other

## 2019-08-02 ENCOUNTER — Telehealth: Payer: Self-pay | Admitting: Hematology

## 2019-08-02 DIAGNOSIS — D46Z Other myelodysplastic syndromes: Secondary | ICD-10-CM

## 2019-08-02 DIAGNOSIS — A419 Sepsis, unspecified organism: Secondary | ICD-10-CM

## 2019-08-02 DIAGNOSIS — N179 Acute kidney failure, unspecified: Secondary | ICD-10-CM

## 2019-08-02 LAB — CBC WITH DIFFERENTIAL/PLATELET
Abs Immature Granulocytes: 0.5 10*3/uL — ABNORMAL HIGH (ref 0.00–0.07)
Band Neutrophils: 3 %
Basophils Absolute: 0 10*3/uL (ref 0.0–0.1)
Basophils Relative: 0 %
Blasts: 4 %
Eosinophils Absolute: 0 10*3/uL (ref 0.0–0.5)
Eosinophils Relative: 0 %
HCT: 22.8 % — ABNORMAL LOW (ref 36.0–46.0)
Hemoglobin: 7.2 g/dL — ABNORMAL LOW (ref 12.0–15.0)
Lymphocytes Relative: 4 %
Lymphs Abs: 1 10*3/uL (ref 0.7–4.0)
MCH: 28.9 pg (ref 26.0–34.0)
MCHC: 31.6 g/dL (ref 30.0–36.0)
MCV: 91.6 fL (ref 80.0–100.0)
Monocytes Absolute: 0.5 10*3/uL (ref 0.1–1.0)
Monocytes Relative: 2 %
Myelocytes: 2 %
Neutro Abs: 21.8 10*3/uL — ABNORMAL HIGH (ref 1.7–7.7)
Neutrophils Relative %: 85 %
Platelets: 34 10*3/uL — ABNORMAL LOW (ref 150–400)
RBC: 2.49 MIL/uL — ABNORMAL LOW (ref 3.87–5.11)
RDW: 17.7 % — ABNORMAL HIGH (ref 11.5–15.5)
WBC: 24.8 10*3/uL — ABNORMAL HIGH (ref 4.0–10.5)
nRBC: 0 % (ref 0.0–0.2)

## 2019-08-02 LAB — CBC
HCT: 25.2 % — ABNORMAL LOW (ref 36.0–46.0)
Hemoglobin: 8.1 g/dL — ABNORMAL LOW (ref 12.0–15.0)
MCH: 29 pg (ref 26.0–34.0)
MCHC: 32.1 g/dL (ref 30.0–36.0)
MCV: 90.3 fL (ref 80.0–100.0)
Platelets: 19 10*3/uL — CL (ref 150–400)
RBC: 2.79 MIL/uL — ABNORMAL LOW (ref 3.87–5.11)
RDW: 16.6 % — ABNORMAL HIGH (ref 11.5–15.5)
WBC: 29.7 10*3/uL — ABNORMAL HIGH (ref 4.0–10.5)
nRBC: 0.1 % (ref 0.0–0.2)

## 2019-08-02 LAB — COMPREHENSIVE METABOLIC PANEL
ALT: 20 U/L (ref 0–44)
AST: 36 U/L (ref 15–41)
Albumin: 2.1 g/dL — ABNORMAL LOW (ref 3.5–5.0)
Alkaline Phosphatase: 73 U/L (ref 38–126)
Anion gap: 10 (ref 5–15)
BUN: 44 mg/dL — ABNORMAL HIGH (ref 8–23)
CO2: 16 mmol/L — ABNORMAL LOW (ref 22–32)
Calcium: 8.2 mg/dL — ABNORMAL LOW (ref 8.9–10.3)
Chloride: 117 mmol/L — ABNORMAL HIGH (ref 98–111)
Creatinine, Ser: 2.51 mg/dL — ABNORMAL HIGH (ref 0.44–1.00)
GFR calc Af Amer: 20 mL/min — ABNORMAL LOW (ref >60)
GFR calc non Af Amer: 17 mL/min — ABNORMAL LOW (ref >60)
Glucose, Bld: 108 mg/dL — ABNORMAL HIGH (ref 70–99)
Potassium: 4.1 mmol/L (ref 3.5–5.1)
Sodium: 143 mmol/L (ref 135–145)
Total Bilirubin: 0.7 mg/dL (ref 0.3–1.2)
Total Protein: 5.6 g/dL — ABNORMAL LOW (ref 6.5–8.1)

## 2019-08-02 LAB — PREPARE PLATELET PHERESIS: Unit division: 0

## 2019-08-02 LAB — BLOOD GAS, ARTERIAL
Acid-base deficit: 9.7 mmol/L — ABNORMAL HIGH (ref 0.0–2.0)
Bicarbonate: 15.1 mmol/L — ABNORMAL LOW (ref 20.0–28.0)
Drawn by: 295031
O2 Content: 2 L/min
O2 Saturation: 95.3 %
Patient temperature: 98.6
pCO2 arterial: 30.3 mmHg — ABNORMAL LOW (ref 32.0–48.0)
pH, Arterial: 7.318 — ABNORMAL LOW (ref 7.350–7.450)
pO2, Arterial: 83 mmHg (ref 83.0–108.0)

## 2019-08-02 LAB — BPAM PLATELET PHERESIS
Blood Product Expiration Date: 202101032359
ISSUE DATE / TIME: 202101032116
Unit Type and Rh: 6200

## 2019-08-02 LAB — PREPARE RBC (CROSSMATCH)

## 2019-08-02 LAB — GLUCOSE, CAPILLARY: Glucose-Capillary: 88 mg/dL (ref 70–99)

## 2019-08-02 MED ORDER — SODIUM CHLORIDE 0.9 % IV SOLN
500.0000 mL | Freq: Once | INTRAVENOUS | Status: AC
  Administered 2019-08-02: 500 mL via INTRAVENOUS

## 2019-08-02 MED ORDER — SODIUM CHLORIDE 0.9% IV SOLUTION: Freq: Once | INTRAVENOUS | Status: AC

## 2019-08-02 MED ORDER — HYDROMORPHONE HCL 1 MG/ML IJ SOLN
0.5000 mg | Freq: Once | INTRAMUSCULAR | Status: AC
  Administered 2019-08-02: 0.5 mg via INTRAVENOUS
  Filled 2019-08-02: qty 0.5

## 2019-08-02 MED ORDER — DEXAMETHASONE SODIUM PHOSPHATE 10 MG/ML IJ SOLN
6.0000 mg | Freq: Two times a day (BID) | INTRAMUSCULAR | Status: DC
  Administered 2019-08-02 – 2019-08-03 (×3): 6 mg via INTRAVENOUS
  Filled 2019-08-02 (×3): qty 1

## 2019-08-02 MED ORDER — HYDROMORPHONE HCL 1 MG/ML IJ SOLN
0.3000 mg | INTRAMUSCULAR | Status: DC | PRN
  Administered 2019-08-02 – 2019-08-03 (×4): 0.3 mg via INTRAVENOUS
  Filled 2019-08-02 (×5): qty 0.5

## 2019-08-02 NOTE — Progress Notes (Signed)
1006: RRT called for nonurgent assessment of pt DT: decreased respiratory effort and grunting, overall mental status concerns, 1015: MD paged to make aware of call to RRT

## 2019-08-02 NOTE — Plan of Care (Signed)
  Problem: Pain Managment: Goal: General experience of comfort will improve Outcome: Progressing   Problem: Safety: Goal: Ability to remain free from injury will improve Outcome: Progressing   Problem: Skin Integrity: Goal: Risk for impaired skin integrity will decrease Outcome: Progressing   

## 2019-08-02 NOTE — Progress Notes (Signed)
OT Cancellation Note  Patient Details Name: Shalaya Goodspeed MRN: [id number] DOB: November 04, 1932   Cancelled Treatment:    Reason Eval/Treat Not Completed: Medical issues which prohibited therapy   Rapid response called. Will check on pt later in day or next day.  Kari Baars, OT Acute Rehabilitation Services [phone] Office- [phone], Edwena Felty D 08/02/2019, 12:40 PM

## 2019-08-02 NOTE — Progress Notes (Signed)
Progress Note  Patient Name: Heather Olson Date of Encounter: 08/02/2019  Primary Cardiologist: Dr. Flonnie Overman- [facility]   Subjective   Pt had an episode of decreased LOC this morning after receiving morphine and Zofran. EKG without ischemic changes. Noted to have decreased urine output. She has received IV fluids and is preparing to receive transfusion.  Pt's eyes open slightly when spoken to but she does not answer any questions. She grunts softly when asked questions. She does not appear to be uncomfortable.   Inpatient Medications    Scheduled Meds: . sodium chloride   Intravenous Once  . sodium chloride   Intravenous Once  . sodium chloride   Intravenous Once  . acyclovir  400 mg Oral BID  . ascorbic acid  500 mg Oral Daily  . Chlorhexidine Gluconate Cloth  6 each Topical Daily  . dexamethasone (DECADRON) injection  6 mg Intravenous Q12H  . docusate sodium  100 mg Oral BID  . lidocaine  2 patch Transdermal Q24H  . multivitamin with minerals  1 tablet Oral Daily  . pantoprazole  40 mg Oral Daily  . sodium chloride flush  10-40 mL Intracatheter Q12H   Continuous Infusions: . sodium chloride 150 mL/hr at 08/02/19 0907  . ceFEPime (MAXIPIME) IV 2 g (08/02/19 0908)  . vancomycin 500 mg (08/02/19 0514)   PRN Meds: acetaminophen **OR** acetaminophen, guaiFENesin-dextromethorphan, HYDROmorphone (DILAUDID) injection, ipratropium-albuterol, lip balm, ondansetron **OR** ondansetron (ZOFRAN) IV, sodium chloride flush   Vital Signs    Vitals:   08/02/19 0427 08/02/19 0837 08/02/19 0838 08/02/19 1041  BP: (!) 118/57 (!) 112/49  131/67  Pulse: (!) 103 92 (!) 59 92  Resp: (!) 28 16    Temp: (!) 100.8 F (38.2 C) 98.2 F (36.8 C)  98.4 F (36.9 C)  TempSrc: Rectal Oral  Oral  SpO2: 98% (!) 84% 98%   Weight:      Height:        Intake/Output Summary (Last 24 hours) at 08/02/2019 1048 Last data filed at 08/01/2019 2249 Gross per 24 hour  Intake 576.54 ml  Output --  Net  576.54 ml   Last 3 Weights 07/31/2019 07/29/2019 07/20/2019  Weight (lbs) 193 lb 9 oz 193 lb 9.6 oz 193 lb 8 oz  Weight (kg) 87.8 kg 87.816 kg 87.771 kg      Telemetry    Sinus rhythm in the 90's with occ PACs and PVCs. She had 2 episodes of SVT lasting about 4 seconds.  - Personally Reviewed  ECG    Sinus rhythm, 95 bpm, QTC 452 - Personally Reviewed  Physical Exam   GEN: No acute distress.   Neck: No JVD Cardiac: RRR, no murmurs, rubs, or gallops.  Respiratory: Clear to auscultation bilaterally. GI: Soft, nontender, non-distended  MS: Trace LE edema; No deformity. Neuro:  Nonfocal  Psych: Normal affect   Labs    High Sensitivity Troponin:   Recent Labs  Lab 07/31/19 0936 07/31/19 1313 08/01/19 1016  TROPONINIHS 218* 245* 227*      Chemistry Recent Labs  Lab 07/31/19 0914 08/01/19 1244 08/02/19 0357  NA 138 138 143  K 4.7 4.4 4.1  CL 105 110 117*  CO2 20* 18* 16*  GLUCOSE 134* 145* 108*  BUN 41* 41* 44*  CREATININE 2.78* 2.60* 2.51*  CALCIUM 9.0 7.9* 8.2*  PROT 6.5  --  5.6*  ALBUMIN 2.6*  --  2.1*  AST 35  --  36  ALT 24  --  20  ALKPHOS 71  --  73  BILITOT 0.8  --  0.7  GFRNONAA 15* 16* 17*  GFRAA 17* 19* 20*  ANIONGAP 13 10 10      Hematology Recent Labs  Lab 07/31/19 0914 08/01/19 1244 08/02/19 0357  WBC 18.2* 19.5* 24.8*  RBC 3.36* 2.62* 2.49*  HGB 9.9* 7.5* 7.2*  HCT 29.3* 23.7* 22.8*  MCV 87.2 90.5 91.6  MCH 29.5 28.6 28.9  MCHC 33.8 31.6 31.6  RDW 16.6* 17.1* 17.7*  PLT 14* 10* 34*    BNPNo results for input(s): BNP, PROBNP in the last 168 hours.   DDimer No results for input(s): DDIMER in the last 168 hours.   Radiology    ECHOCARDIOGRAM COMPLETE  Result Date: 08/01/2019   ECHOCARDIOGRAM REPORT   Patient Name:   Heather Olson Date of Exam: 08/01/2019 Medical Rec #:  [id number]     Height:       64.0 in Accession #:    [id number]    Weight:       193.6 lb Date of Birth:  May 19, 1933    BSA:          1.93 m Patient Age:    86  years      BP:           137/51 mmHg Patient Gender: F             HR:           101 bpm. Exam Location:  Inpatient Procedure: 2D Echo, Cardiac Doppler and Color Doppler Indications:    Acute Cornonary Syndrome I24.9  History:        Patient has no prior history of Echocardiogram examinations.                 Risk Factors:Non-Smoker. Sepsis. Elevated troponin.  Sonographer:    Paulita Fujita RDCS Referring Phys: [id number] [facility] [place]  Sonographer Comments: Technically difficult study due to poor echo windows. IMPRESSIONS  1. Left ventricular ejection fraction, by visual estimation, is 70 to 75%. The left ventricle has hyperdynamic function. There is mildly increased left ventricular hypertrophy. Turbulent LVOT flow without obvious gradient.  2. Left ventricular diastolic parameters are consistent with Grade I diastolic dysfunction (impaired relaxation).  3. The left ventricle has no regional wall motion abnormalities.  4. Global right ventricle has normal systolic function.The right ventricular size is normal. No increase in right ventricular wall thickness.  5. Left atrial size was normal.  6. Right atrial size was normal.  7. Mild mitral annular calcification.  8. The mitral valve is grossly normal. Trivial mitral valve regurgitation.  9. The tricuspid valve is grossly normal. 10. The aortic valve is tricuspid. Aortic valve regurgitation is not visualized. 11. The pulmonic valve was grossly normal. Pulmonic valve regurgitation is trivial. 12. The inferior vena cava is normal in size with greater than 50% respiratory variability, suggesting right atrial pressure of 3 mmHg. 13. TR signal is inadequate for assessing pulmonary artery systolic pressure. FINDINGS  Left Ventricle: Left ventricular ejection fraction, by visual estimation, is 70 to 75%. The left ventricle has hyperdynamic function. The left ventricle has no regional wall motion abnormalities. The left ventricular internal cavity size was the left  ventricle is normal in size. There is mildly increased left ventricular hypertrophy. Left ventricular diastolic parameters are consistent with Grade I diastolic dysfunction (impaired relaxation). Right Ventricle: The right ventricular size is normal. No increase in right ventricular wall thickness. Global RV systolic function is has normal systolic  function. Left Atrium: Left atrial size was normal in size. Right Atrium: Right atrial size was normal in size Pericardium: There is no evidence of pericardial effusion. Mitral Valve: The mitral valve is grossly normal. Mild mitral annular calcification. Trivial mitral valve regurgitation. Tricuspid Valve: The tricuspid valve is grossly normal. Tricuspid valve regurgitation is trivial. Aortic Valve: The aortic valve is tricuspid. Aortic valve regurgitation is not visualized. Mild aortic valve annular calcification. Pulmonic Valve: The pulmonic valve was grossly normal. Pulmonic valve regurgitation is trivial. Pulmonic regurgitation is trivial. Aorta: The aortic root is normal in size and structure. Venous: The inferior vena cava is normal in size with greater than 50% respiratory variability, suggesting right atrial pressure of 3 mmHg. IAS/Shunts: No atrial level shunt detected by color flow Doppler.  LEFT VENTRICLE PLAX 2D LVOT diam:     1.70 cm  Diastology LVOT Area:     2.27 cm LV e' lateral:   5.87 cm/s                         LV E/e' lateral: 17.2                         LV e' medial:    5.11 cm/s                         LV E/e' medial:  19.8  LEFT ATRIUM             Index LA Vol (A2C):   24.2 ml 12.54 ml/m LA Vol (A4C):   36.0 ml 18.66 ml/m LA Biplane Vol: 31.1 ml 16.12 ml/m  AORTIC VALVE LVOT Vmax:   105.00 cm/s LVOT Vmean:  79.900 cm/s LVOT VTI:    0.248 m MITRAL VALVE MV Area (PHT): 1.63 cm              SHUNTS MV PHT:        134.85 msec           Systemic VTI:  0.25 m MV Decel Time: 465 msec              Systemic Diam: 1.70 cm MV E velocity: 101.00 cm/s 103  cm/s MV A velocity: 160.00 cm/s 70.3 cm/s MV E/A ratio:  0.63        1.5  Rozann Lesches MD Electronically signed by Rozann Lesches MD Signature Date/Time: 08/01/2019/12:22:02 PM    Final     Cardiac Studies   Echocardiogram 08/01/2019 IMPRESSIONS    1. Left ventricular ejection fraction, by visual estimation, is 70 to 75%. The left ventricle has hyperdynamic function. There is mildly increased left ventricular hypertrophy. Turbulent LVOT flow without obvious gradient.  2. Left ventricular diastolic parameters are consistent with Grade I diastolic dysfunction (impaired relaxation).  3. The left ventricle has no regional wall motion abnormalities.  4. Global right ventricle has normal systolic function.The right ventricular size is normal. No increase in right ventricular wall thickness.  5. Left atrial size was normal.  6. Right atrial size was normal.  7. Mild mitral annular calcification.  8. The mitral valve is grossly normal. Trivial mitral valve regurgitation.  9. The tricuspid valve is grossly normal. 10. The aortic valve is tricuspid. Aortic valve regurgitation is not visualized. 11. The pulmonic valve was grossly normal. Pulmonic valve regurgitation is trivial. 12. The inferior vena cava is normal in size with greater than 50% respiratory variability,  suggesting right atrial pressure of 3 mmHg. 13. TR signal is inadequate for assessing pulmonary artery systolic pressure.   Patient Profile     86 y.o. female with a history of diabetes, hypertension, polymyositis, CKD stage II, syncope and MDS who is being seen for elevated troponin in the setting of weakness, lethargy, fever and sepsis.  Assessment & Plan    Elevated troponins -High-sensitivity troponins mildly elevated but mostly flat pattern. 218>245>227 -EKG without acute ischemic changes. -No reported chest pain. -Normal stress test in 2017. -Echo 08/30/2016 revealed LVEF greater than [id number], grade 1 diastolic dysfunction  with mild LVH and was otherwise unremarkable.  It was unchanged 10/2018. -Echocardiogram done yesterday showed hyperdynamic LV function with EF 70-75%, mildly increased LV hypertrophy and turbulent LVOT flow without obvious gradient, grade 1 diastolic dysfunction, no regional wall motion abnormalities. -Elevated troponins are likely due to demand ischemia in the setting of acute illness. -No plan for further cardiac evaluation during this hospitalization.  She can follow-up with her primary cardiologist as an outpatient to determine if she needs repeat stress testing.  Sepsis -T 101 this am. WBCs 24.8.  -Management per primary team. On IV antibiotics. -Decreased LOC this am. Decreased UOP. Has been given IV fluids and is now being transfused. Steroids to be resumed.   Hypertension -Losartan currently on hold given acute illness, AKI. -Blood pressures are mostly well controlled and stable.  Acute renal failure -SCr up to 2.6 yesterday, 2.51 today. Decreased UOP. Now undergoing renal ultrasound. Management per primary team.   For questions or updates, please contact [place] Please consult [email or web address] for contact info under        Signed, Daune Perch, NP  08/02/2019, 10:48 AM

## 2019-08-02 NOTE — Progress Notes (Signed)
CRITICAL VALUE ALERT  Critical Value:  Platelets 19  Date & Time Notied:  08/01/18  Provider Notified: Baltazar Najjar, NP  Orders Received/Actions taken: 1 unit Platelets ordered. Waiting for them to be ready.

## 2019-08-02 NOTE — Telephone Encounter (Signed)
Per 12/31 los, appts did not get scheduled for 1/4.  Patient is in the ED.

## 2019-08-02 NOTE — Progress Notes (Signed)
Rapid Response Event Note Nurse called about elevated MEWS score of 5, see VSS.    Interventions:  Primary nurse to reach out to provider regarding tempeture, 500 ml bolus of NS per protocol.   Plan of Care (if not transferred):  Ice packs to groin, axillary, and nap of neck. Recheck tempeture in one hour, and continue to monitor. If Mews score remain elevated primary nurse will notify RRT and provider.   [facility] Icelyn Navarrete MSN, RN-BC  [place]

## 2019-08-02 NOTE — TOC Initial Note (Signed)
Transition of Care [facility]) - Initial/Assessment Note    Patient Details  Name: Heather Olson MRN: [id number] Date of Birth: 1933/03/31  Transition of Care (TOC) CM/SW Contact:    Joaquin Courts, RN Phone Number: 08/02/2019, 2:38 PM  Clinical Narrative:                 CM spoke with patient's son and discussed recommendation for SNF. CM discussed SNF placement process. Son states he would be agreeable to patient going to SNF once she is medically stable. FL 2 faxed out to area facilities, will await bed offers.   Expected Discharge Plan: Skilled Nursing Facility Barriers to Discharge: Continued Medical Work up   Patient Goals and CMS Choice Patient states their goals for this hospitalization and ongoing recovery are:: to get better CMS Medicare.gov Compare Post Acute Care list provided to:: Patient Represenative (must comment) Choice offered to / list presented to : Adult Children  Expected Discharge Plan and Services Expected Discharge Plan: [place]   Discharge Planning Services: CM Consult Post Acute Care Choice: [place] Living arrangements for the past 2 months: Single Family Home                 DME Arranged: N/A DME Agency: NA       HH Arranged: NA HH Agency: NA        Prior Living Arrangements/Services Living arrangements for the past 2 months: Single Family Home Lives with:: Adult Children Patient language and need for interpreter reviewed:: Yes Do you feel safe going back to the place where you live?: Yes      Need for Family Participation in Patient Care: Yes (Comment) Care giver support system in place?: Yes (comment)   Criminal Activity/Legal Involvement Pertinent to Current Situation/Hospitalization: No - Comment as needed  Activities of Daily Living Home Assistive Devices/Equipment: Walker (specify type) ADL Screening (condition at time of admission) Patient's cognitive ability adequate to safely complete daily  activities?: No Is the patient deaf or have difficulty hearing?: Yes Does the patient have difficulty seeing, even when wearing glasses/contacts?: Yes Does the patient have difficulty concentrating, remembering, or making decisions?: Yes Patient able to express need for assistance with ADLs?: No Does the patient have difficulty dressing or bathing?: Yes Independently performs ADLs?: No Communication: Independent Dressing (OT): Needs assistance Is this a change from baseline?: Change from baseline, expected to last <3days Grooming: Needs assistance Is this a change from baseline?: Change from baseline, expected to last <3 days Feeding: Needs assistance Is this a change from baseline?: Change from baseline, expected to last <3 days Bathing: Needs assistance Is this a change from baseline?: Change from baseline, expected to last <3 days Toileting: Needs assistance Is this a change from baseline?: Change from baseline, expected to last <3 days In/Out Bed: Needs assistance Is this a change from baseline?: Change from baseline, expected to last <3 days Walks in Home: Independent Does the patient have difficulty walking or climbing stairs?: Yes Weakness of Legs: Both Weakness of Arms/Hands: Both  Permission Sought/Granted   Permission granted to share information with : Yes, Verbal Permission Granted     Permission granted to share info w AGENCY: area SNF        Emotional Assessment           Psych Involvement: No (comment)  Admission diagnosis:  Weakness [R53.1] Sepsis ([place]) [A41.9] Sepsis due to undetermined organism [facility]) [A41.9] Patient Active Problem List   Diagnosis Date Noted  .  Sepsis ([place]) 07/31/2019  . Weakness 07/31/2019  . Diarrhea 07/31/2019  . AKI (acute kidney injury) ([place]) 07/31/2019  . Elevated troponin 07/31/2019  . CKD (chronic kidney disease), stage III 07/31/2019  . Port-A-Cath in place 02/22/2019  . MDS (myelodysplastic syndrome), high grade ([place])  02/18/2019  . Postop check 06/15/2013  . Cholelithiases 05/11/2013   PCP:  Johnston Ebbs, MD Pharmacy:   [id number] - [place], [place]. AT [address] [place] [address] Phone: [phone] Fax: [phone]     Social Determinants of Health (SDOH) Interventions    Readmission Risk Interventions No flowsheet data found.

## 2019-08-02 NOTE — Significant Event (Signed)
Rapid Response Event Note  Overview: Time Called: 1000 Arrival Time: 1003 Event Type: Respiratory, Cardiac  Initial Focused Assessment:  RRT called due to lethargy, and decreased respiratory effort. Patient drowsy has received Zofran and Morphine. Pateint has eyes open but does not blink to physical threat. Vital signs stable, Heart rate 84, BP 132/74, respirations 14. Has been febrile ice packs on patient. Patient with shallow respirations initially but once stimulated has unlabored respirations. CBG spot checked as patient hasn't eaten. CBG 84. When calling her name she moans and when physically stimulated moans. MD paged. Heart and lung sounds diminished. Strong radial pulses. Has had 150 mL output overnight and anuric since 0700. Bladder scanner at bedside. MD responded to bedside. Son at bedside and discussing plan with MD. Son wishing to transfer patient to [facility]. RT obtaining ABG and patient yelling out. Patient hasn't tolerated PO medications and has been off of chronic  Steroids since admission. Plan to restart IV steroids to avoid adrenal crisis. Son agreeable to plan and understands lack of available beds at [facility]. Hemoglobin 7.2 plan to transfuse. Repeat labs two hours post transfusion. Patient now saying few words and moaning constantly during stimulation by her son. Son requesting Morphine as he feels his mother is in pain. Bedside RN to medicate at MD consent.  Interventions:  Plan to start IV steroids as patient is on chronic steroids and hasn't been tolerating oral meds Fluid Bolus ABG Bladder scan Requesting possible transfer to [facility] as her oncologist is there Plan to transfuse blood with tylenol as pre-med for fever  Event Summary: Name of Physician Notified: Bernadene Bell MD at 1015 Outcome: Stayed in room and stabalized  Event End Time: [place]

## 2019-08-02 NOTE — Progress Notes (Addendum)
PROGRESS NOTE    Heather Olson  [id number] DOB: Mar 17, 1933 DOA: 07/31/2019 PCP: Johnston Ebbs, MD    Brief Narrative:  Heather Olson is a 86 y.o. female with medical history significant of MDS, myositis. Presenting with weakness for 3 days. Hx per son. He reports that Thursday he noted his mom was fatigued and lethargic. He took her to her oncologist, who was concerned for infection. Was given abx. He noticed over the next two days that she had increased weakness to the point that she couldn't walk or use her rollator. He noticed that she was confused. He noticed that she had frequent diarrhea. He became concerned because she had fevers and brought her to [facility]. She was found to be septic. Started on vanc/cefepime. Found to have elevated troponin. Cardiology notified. Her Oncologist team was notified.   Assessment & Plan:   Active Problems:   MDS (myelodysplastic syndrome), high grade (HCC)   Sepsis (HCC)   Weakness   Diarrhea   AKI (acute kidney injury) ([place])   Elevated troponin   CKD (chronic kidney disease), stage III  Sepsis Diarrhea     - unknown cause, stool sample still not provided but patient had been on Augmentin, prescribed by her Oncologist a fews days prior to her presentation for the treatment of a sorethroat.      - fevers, tachy, elevated white count, AKI     - Follow up on UCx Bld Cx     - currently on vanc/cefepime; pharm to dose.     - she has a port; no signs of infection from this so far with blood cultures NGTD. May need her port removed with port tip cultured if continues to be febrile.       - COVID 19 negative.      - Will recheck CXR      AKI on CKD3a     - fluids, watch nephrotoxins. Continue IV fluids.      -Renal US with no acute changes or obstruction.      -Consider Nephrology consultation      -Monitor UOP closely  Elevated troponins     - cards onboard, appreciate assistance     - Trp flat     - 2D echo with nl EF, grd1 diastolic    Weakness     - likely d/t above     - PT/OT consult  Polymyositis Likely exacerbated with current dehydration, CK wnl Patient not tolerating po intake well, will start IV dexamethasone 6mg  BID.  She is on prednisone 5mg  po daily at home.  Cortisol level on presentation adequate.  Changed morphine IV prn ro low dose Dilaudid IV prn, monitor for pain control.  -Of note, patient with chronic low back pain and does not tolerate being flat on bed for long. -Requesting transfer to [facility] per her son, as he would like her to have inpatient Rheumatology services. Request for transfer was placed on 1/4 but they had no beds. Accepting physician is Dr. Wilmon Arms with the Oncology services.    MDS Thrombocytopenia, with critical lab of 10K. Improved to 34K after transfusion with 1 unit of platelets on 08/01/2019.  CT head STAT on 08/02/2019 with no evidence of brain bleed. -Oncology following.  - No signs of frank bleed - Hgb at 7.2, will transfuse 1 unit of pRBC per recommendation from Oncology.  - Oncology accepting physician is Dr. Wilmon Arms. She recommends to maintain Hgb above 8 and platelets above  10K.  Altered mental status/acute metabolic encephalopathy/?delirium Patient with moaning at times but this quickly improves at certain times, for example, when her son talks to her.  Patient verbal, denies pain. With non focal Neuro changes.  CT head STAT on 1/4 with no acute changes.  Patient is hard of hearing and question possible exacerbation of her symptoms due to her hearing impairment--she has one hearing aid.  -Continue redirection, continue monitoring.     DVT prophylaxis: SCDs  Code Status: DNR (confirmed with son)  Family Communication: With Son at the bedside  Disposition Plan: TBD  Consults called: Cardiology and Oncology  Admission status: Inpatient d/t need for IVF; IV abx, hemodynamic instability, ongoing w/u.   Subjective: Patient febrile overnight with  Tmax of 102.44F around 230 am with confusion. She had increase in confusion and was moaning this morning and rapid response was called. She was found to have no neurological symptoms. She started following commands and answering questions once her hearing aids were placed. She complained of back pain that improved once her bed was inclined. Vital signs were stable at that time.   Objective: Vitals:   08/02/19 1041 08/02/19 1250 08/02/19 1329 08/02/19 1349  BP: 131/67 109/64 (!) 121/53 (!) 108/52  Pulse: 92 90 88 85  Resp:  18 20 19   Temp: 98.4 F (36.9 C) 97.8 F (36.6 C) 98.5 F (36.9 C) 98.3 F (36.8 C)  TempSrc: Oral Oral Axillary Axillary  SpO2:  95% 95% 93%  Weight:      Height:        Intake/Output Summary (Last 24 hours) at 08/02/2019 1459 Last data filed at 08/02/2019 1333 Gross per 24 hour  Intake 3742.31 ml  Output --  Net 3742.31 ml   Filed Weights   07/31/19 0919  Weight: 87.8 kg    Examination:  General exam: Appears calm and comfortable  Respiratory system: Clear to auscultation. Respiratory effort normal. Cardiovascular system: S1 & S2 heard, RRR.  Gastrointestinal system: Abdomen is nondistended, soft and nontender.  Central nervous system: Alert and oriented to person. Hard of hearing. No facial droop. Able to move ext x 4 voluntarily.  Extremities: Symmetric 5 x 5 power. Normal tone.  Skin: No rashes, lesions or ulcers Psychiatry: Guarded affect.      Data Reviewed: I have personally reviewed following labs and imaging studies  CBC: Recent Labs  Lab 07/29/19 1149 07/31/19 0914 08/01/19 1244 08/02/19 0357  WBC 8.8 18.2* 19.5* 24.8*  NEUTROABS 5.1 10.3*  --  21.8*  HGB 9.7* 9.9* 7.5* 7.2*  HCT 28.2* 29.3* 23.7* 22.8*  MCV 84.4 87.2 90.5 91.6  PLT 20* 14* 10* 34*   Basic Metabolic Panel: Recent Labs  Lab 07/31/19 0914 08/01/19 1244 08/02/19 0357  NA 138 138 143  K 4.7 4.4 4.1  CL 105 110 117*  CO2 20* 18* 16*  GLUCOSE 134* 145* 108*   BUN 41* 41* 44*  CREATININE 2.78* 2.60* 2.51*  CALCIUM 9.0 7.9* 8.2*   GFR: Estimated Creatinine Clearance: 17.9 mL/min (A) (by C-G formula based on SCr of 2.51 mg/dL (H)). Liver Function Tests: Recent Labs  Lab 07/31/19 0914 08/02/19 0357  AST 35 36  ALT 24 20  ALKPHOS 71 73  BILITOT 0.8 0.7  PROT 6.5 5.6*  ALBUMIN 2.6* 2.1*   Recent Labs  Lab 07/31/19 0936  LIPASE 27   No results for input(s): AMMONIA in the last 168 hours. Coagulation Profile: Recent Labs  Lab 07/31/19 1000 08/01/19 0500  INR 1.6* 1.7*   Cardiac Enzymes: Recent Labs  Lab 08/01/19 1306  CKTOTAL 81   BNP (last 3 results) No results for input(s): PROBNP in the last 8760 hours. HbA1C: No results for input(s): HGBA1C in the last 72 hours. CBG: Recent Labs  Lab 07/31/19 1010 08/01/19 0413 08/02/19 1028  GLUCAP 81 96 88   Lipid Profile: No results for input(s): CHOL, HDL, LDLCALC, TRIG, CHOLHDL, LDLDIRECT in the last 72 hours. Thyroid Function Tests: No results for input(s): TSH, T4TOTAL, FREET4, T3FREE, THYROIDAB in the last 72 hours. Anemia Panel: No results for input(s): VITAMINB12, FOLATE, FERRITIN, TIBC, IRON, RETICCTPCT in the last 72 hours. Sepsis Labs: Recent Labs  Lab 07/31/19 0923 08/01/19 0500  PROCALCITON  --  18.80  LATICACIDVEN 1.6  --     Recent Results (from the past 240 hour(s))  Urine culture     Status: None   Collection Time: 07/31/19  2:36 PM   Specimen: In/Out Cath Urine  Result Value Ref Range Status   Specimen Description   Final    IN/OUT CATH URINE Performed at [facility], [place] [address], [place], [place] [address]    Special Requests   Final    NONE Performed at [facility], [place] [address], [place], [place] [address]    Culture   Final    NO GROWTH Performed at [facility], [place] [address] [place], [place] [address]    Report Status 08/01/2019 FINAL  Final  SARS CORONAVIRUS 2 (TAT 6-24 HRS)  Nasopharyngeal Nasopharyngeal Swab     Status: None   Collection Time: 07/31/19 10:25 PM   Specimen: Nasopharyngeal Swab  Result Value Ref Range Status   SARS Coronavirus 2 NEGATIVE NEGATIVE Final    Comment: (NOTE) SARS-CoV-2 target nucleic acids are NOT DETECTED. The SARS-CoV-2 RNA is generally detectable in upper and lower respiratory specimens during the acute phase of infection. Negative results do not preclude SARS-CoV-2 infection, do not rule out co-infections with other pathogens, and should not be used as the sole basis for treatment or other patient management decisions. Negative results must be combined with clinical observations, patient history, and epidemiological information. The expected result is Negative. Fact Sheet for Patients: SugarRoll.be Fact Sheet for Healthcare Providers: https://www.woods-mathews.com/ This test is not yet approved or cleared by the [place] FDA and  has been authorized for detection and/or diagnosis of SARS-CoV-2 by FDA under an Emergency Use Authorization (EUA). This EUA will remain  in effect (meaning this test can be used) for the duration of the COVID-19 declaration under Section 56 4(b)(1) of the Act, 21 U.S.C. section 360bbb-3(b)(1), unless the authorization is terminated or revoked sooner. Performed at [facility], [place] [address], [place], [place] [address]   Blood Culture (routine x 2)     Status: None (Preliminary result)   Collection Time: 08/01/19  6:08 AM   Specimen: BLOOD  Result Value Ref Range Status   Specimen Description   Final    BLOOD LEFT ANTECUBITAL Performed at [facility] [address] [place], [place] [address]    Special Requests   Final    BOTTLES DRAWN AEROBIC ONLY Blood Culture adequate volume Performed at [facility] [address], [place], [place] [address]    Culture   Final    NO GROWTH 1 DAY Performed  at [facility], [place] [address], [place], [place] [address]    Report Status PENDING  Incomplete  Blood Culture (routine x 2)     Status: None (Preliminary result)   Collection Time: 08/01/19  6:08 AM   Specimen: BLOOD  Result Value Ref Range Status   Specimen Description   Final    BLOOD BLOOD LEFT HAND Performed at [facility] [address] [place], [place] [address]    Special Requests   Final    BOTTLES DRAWN AEROBIC ONLY Blood Culture adequate volume Performed at [facility] [address], [place], [place] [address]    Culture   Final    NO GROWTH 1 DAY Performed at [facility], [place] [address], [place], [place] [address]    Report Status PENDING  Incomplete         Radiology Studies: CT HEAD WO CONTRAST  Result Date: 08/02/2019 CLINICAL DATA:  86 year old female with lethargy, decreased respiratory effort, delirium. EXAM: CT HEAD WITHOUT CONTRAST TECHNIQUE: Contiguous axial images were obtained from the base of the skull through the vertex without intravenous contrast. COMPARISON:  Head CT 10/24/2018 and earlier. FINDINGS: Brain: Streak artifact from right cochlear implant device again noted. Cerebral volume is stable since 2020 and within normal limits for age. No midline shift, ventriculomegaly, mass effect, evidence of mass lesion, intracranial hemorrhage or evidence of cortically based acute infarction. Questionable increased hypodensity in the left basal ganglia and internal capsule compared to 10/24/2018 (series 2, image 16) this is in the region of streak artifact. Elsewhere cerebral white matter hypodensity appears stable. Vascular: Calcified atherosclerosis at the skull base. No suspicious intracranial vascular hyperdensity. Skull: No acute osseous abnormality identified. Sinuses/Orbits: Previous right mastoidectomy with improved right tympanic cavity and mastoid defect pneumatization since 2020. Other Visualized  paranasal sinuses and mastoids are stable and well pneumatized. Other: Right side cochlear implant with generator device along the right posterior convexity and associated streak artifact. No acute orbit or scalp soft tissue finding. IMPRESSION: 1. Streak artifact related to chronic right cochlear implant with real versus artifactual new hypodensity in the left basal ganglia since 10/24/2018. If there is no right side weakness/deficits then artifact would be favored. 2. Otherwise stable noncontrast CT appearance of the brain. Electronically Signed   By: Genevie Ann M.D.   On: 08/02/2019 11:58   US RENAL  Result Date: 08/02/2019 CLINICAL DATA:  86 year old female with renal failure. EXAM: RENAL / URINARY TRACT ULTRASOUND COMPLETE COMPARISON:  Abdomen ultrasound 05/08/2013. FINDINGS: Right Kidney: Renal measurements: 9.5 x 3.9 x 4.7 centimeters = volume: 90 mL. No right hydronephrosis or solid renal mass. Small 2 centimeter right upper pole cyst was not apparent in 2014. the right renal cortex appears echogenic compared to the liver on image 5. Left Kidney: Renal measurements: 10.1 x 4.4 x 4.4 centimeters = volume: 101 mL. Echogenic left kidney similar to that on the right. No mass or hydronephrosis visualized. Bladder: Diminutive.  Appears normal for degree of bladder distention. Other: None. IMPRESSION: No acute renal findings. Evidence of some chronic medical renal disease. Electronically Signed   By: Genevie Ann M.D.   On: 08/02/2019 13:48   ECHOCARDIOGRAM COMPLETE  Result Date: 08/01/2019   ECHOCARDIOGRAM REPORT   Patient Name:   BERLYNN PADGET Date of Exam: 08/01/2019 Medical Rec #:  [id number]     Height:       64.0 in Accession #:    [id number]    Weight:       193.6 lb Date of Birth:  06-04-1933    BSA:  1.93 m Patient Age:    9 years      BP:           137/51 mmHg Patient Gender: F             HR:           101 bpm. Exam Location:  Inpatient Procedure: 2D Echo, Cardiac Doppler and Color Doppler  Indications:    Acute Cornonary Syndrome I24.9  History:        Patient has no prior history of Echocardiogram examinations.                 Risk Factors:Non-Smoker. Sepsis. Elevated troponin.  Sonographer:    Paulita Fujita RDCS Referring Phys: [id number] [facility]  Sonographer Comments: Technically difficult study due to poor echo windows. IMPRESSIONS  1. Left ventricular ejection fraction, by visual estimation, is 70 to 75%. The left ventricle has hyperdynamic function. There is mildly increased left ventricular hypertrophy. Turbulent LVOT flow without obvious gradient.  2. Left ventricular diastolic parameters are consistent with Grade I diastolic dysfunction (impaired relaxation).  3. The left ventricle has no regional wall motion abnormalities.  4. Global right ventricle has normal systolic function.The right ventricular size is normal. No increase in right ventricular wall thickness.  5. Left atrial size was normal.  6. Right atrial size was normal.  7. Mild mitral annular calcification.  8. The mitral valve is grossly normal. Trivial mitral valve regurgitation.  9. The tricuspid valve is grossly normal. 10. The aortic valve is tricuspid. Aortic valve regurgitation is not visualized. 11. The pulmonic valve was grossly normal. Pulmonic valve regurgitation is trivial. 12. The inferior vena cava is normal in size with greater than 50% respiratory variability, suggesting right atrial pressure of 3 mmHg. 13. TR signal is inadequate for assessing pulmonary artery systolic pressure. FINDINGS  Left Ventricle: Left ventricular ejection fraction, by visual estimation, is 70 to 75%. The left ventricle has hyperdynamic function. The left ventricle has no regional wall motion abnormalities. The left ventricular internal cavity size was the left ventricle is normal in size. There is mildly increased left ventricular hypertrophy. Left ventricular diastolic parameters are consistent with Grade I diastolic dysfunction  (impaired relaxation). Right Ventricle: The right ventricular size is normal. No increase in right ventricular wall thickness. Global RV systolic function is has normal systolic function. Left Atrium: Left atrial size was normal in size. Right Atrium: Right atrial size was normal in size Pericardium: There is no evidence of pericardial effusion. Mitral Valve: The mitral valve is grossly normal. Mild mitral annular calcification. Trivial mitral valve regurgitation. Tricuspid Valve: The tricuspid valve is grossly normal. Tricuspid valve regurgitation is trivial. Aortic Valve: The aortic valve is tricuspid. Aortic valve regurgitation is not visualized. Mild aortic valve annular calcification. Pulmonic Valve: The pulmonic valve was grossly normal. Pulmonic valve regurgitation is trivial. Pulmonic regurgitation is trivial. Aorta: The aortic root is normal in size and structure. Venous: The inferior vena cava is normal in size with greater than 50% respiratory variability, suggesting right atrial pressure of 3 mmHg. IAS/Shunts: No atrial level shunt detected by color flow Doppler.  LEFT VENTRICLE PLAX 2D LVOT diam:     1.70 cm  Diastology LVOT Area:     2.27 cm LV e' lateral:   5.87 cm/s                         LV E/e' lateral: 17.2  LV e' medial:    5.11 cm/s                         LV E/e' medial:  19.8  LEFT ATRIUM             Index LA Vol (A2C):   24.2 ml 12.54 ml/m LA Vol (A4C):   36.0 ml 18.66 ml/m LA Biplane Vol: 31.1 ml 16.12 ml/m  AORTIC VALVE LVOT Vmax:   105.00 cm/s LVOT Vmean:  79.900 cm/s LVOT VTI:    0.248 m MITRAL VALVE MV Area (PHT): 1.63 cm              SHUNTS MV PHT:        134.85 msec           Systemic VTI:  0.25 m MV Decel Time: 465 msec              Systemic Diam: 1.70 cm MV E velocity: 101.00 cm/s 103 cm/s MV A velocity: 160.00 cm/s 70.3 cm/s MV E/A ratio:  0.63        1.5  Rozann Lesches MD Electronically signed by Rozann Lesches MD Signature Date/Time:  08/01/2019/12:22:02 PM    Final         Scheduled Meds: . sodium chloride   Intravenous Once  . acyclovir  400 mg Oral BID  . ascorbic acid  500 mg Oral Daily  . Chlorhexidine Gluconate Cloth  6 each Topical Daily  . dexamethasone (DECADRON) injection  6 mg Intravenous Q12H  . docusate sodium  100 mg Oral BID  . lidocaine  2 patch Transdermal Q24H  . multivitamin with minerals  1 tablet Oral Daily  . pantoprazole  40 mg Oral Daily  . sodium chloride flush  10-40 mL Intracatheter Q12H   Continuous Infusions: . sodium chloride Stopped (08/02/19 1332)  . ceFEPime (MAXIPIME) IV 2 g (08/02/19 0908)  . vancomycin 500 mg (08/02/19 0514)     LOS: 2 days    Time spent: 37 minutes with more than 50% of this time on patient counseling and treatment plan discussion.     Blain Pais, MD [facility]   If 7PM-7AM, please contact night-coverage [email or web address] Password [facility] 08/02/2019, 2:59 PM

## 2019-08-02 NOTE — NC FL2 (Signed)
[place] LEVEL OF CARE SCREENING TOOL     IDENTIFICATION  Patient Name: Heather Olson Birthdate: [birth year] Sex: female Admission Date (Current Location): 07/31/2019  [facility] and [place] Number:  Herbalist and Address:  [facility],  [place] [address] [place]      Provider Number: [id number]  Attending Physician Name and Address:  Blain Pais, MD  Relative Name and Phone Number:       Current Level of Care: Hospital Recommended Level of Care: [place] Prior Approval Number:    Date Approved/Denied:   PASRR Number: [id number]  Discharge Plan: SNF    Current Diagnoses: Patient Active Problem List   Diagnosis Date Noted  . Sepsis ([place]) 07/31/2019  . Weakness 07/31/2019  . Diarrhea 07/31/2019  . AKI (acute kidney injury) ([place]) 07/31/2019  . Elevated troponin 07/31/2019  . CKD (chronic kidney disease), stage III 07/31/2019  . Port-A-Cath in place 02/22/2019  . MDS (myelodysplastic syndrome), high grade ([place]) 02/18/2019  . Postop check 06/15/2013  . Cholelithiases 05/11/2013    Orientation RESPIRATION BLADDER Height & Weight     Self  Normal Incontinent Weight: 87.8 kg Height:  5\' 4"  (162.6 cm)  BEHAVIORAL SYMPTOMS/MOOD NEUROLOGICAL BOWEL NUTRITION STATUS      Incontinent Diet  AMBULATORY STATUS COMMUNICATION OF NEEDS Skin   Total Care Verbally Normal                       Personal Care Assistance Level of Assistance  Bathing, Dressing, Total care, Feeding Bathing Assistance: Maximum assistance Feeding assistance: Maximum assistance Dressing Assistance: Maximum assistance Total Care Assistance: Maximum assistance   Functional Limitations Info             SPECIAL CARE FACTORS FREQUENCY  PT (By licensed PT), OT (By licensed OT)     PT Frequency: 5x weekly OT Frequency: 5x weekly            Contractures Contractures Info: Not present    Additional Factors Info  Code  Status, Allergies Code Status Info: full Allergies Info: Butalbital-apap-caffeine, Imuran, Lisinopril           Current Medications (08/02/2019):  This is the current hospital active medication list Current Facility-Administered Medications  Medication Dose Route Frequency Provider Last Rate Last Admin  . 0.9 %  sodium chloride infusion (Manually program via Guardrails IV Fluids)   Intravenous Once Adele Barthel D, MD      . 0.9 %  sodium chloride infusion   Intravenous Continuous Adele Barthel D, MD   Stopped at 08/02/19 1332  . acetaminophen (TYLENOL) tablet 650 mg  650 mg Oral Q6H PRN Marylyn Ishihara, Tyrone A, DO   650 mg at 08/01/19 1006   Or  . acetaminophen (TYLENOL) suppository 650 mg  650 mg Rectal Q6H PRN Marylyn Ishihara, Tyrone A, DO   650 mg at 08/02/19 0135  . acyclovir (ZOVIRAX) 200 MG capsule 400 mg  400 mg Oral BID Marylyn Ishihara, Tyrone A, DO   400 mg at 07/31/19 2315  . ascorbic acid (VITAMIN C) tablet 500 mg  500 mg Oral Daily Kyle, Tyrone A, DO      . ceFEPIme (MAXIPIME) 2 g in sodium chloride 0.9 % 100 mL IVPB  2 g Intravenous Q24H Leodis Sias T, RPH 200 mL/hr at 08/02/19 0908 2 g at 08/02/19 0908  . Chlorhexidine Gluconate Cloth 2 % PADS 6 each  6 each Topical Daily Jackelyn Knife, MD  6 each at 08/02/19 0910  . dexamethasone (DECADRON) injection 6 mg  6 mg Intravenous Q12H Adele Barthel D, MD   6 mg at 08/02/19 1053  . docusate sodium (COLACE) capsule 100 mg  100 mg Oral BID Marylyn Ishihara, Tyrone A, DO      . guaiFENesin-dextromethorphan (ROBITUSSIN DM) 100-10 MG/5ML syrup 5 mL  5 mL Oral Q4H PRN Jackelyn Knife, MD   5 mL at 08/01/19 0224  . HYDROmorphone (DILAUDID) injection 0.3 mg  0.3 mg Intravenous Q3H PRN Adele Barthel D, MD   0.3 mg at 08/02/19 1100  . ipratropium-albuterol (DUONEB) 0.5-2.5 (3) MG/3ML nebulizer solution 3 mL  3 mL Nebulization Q4H PRN [facility], [place], MD      . lidocaine (LIDODERM) 5 % 2 patch  2 patch Transdermal Q24H Skeet Latch, MD   2 patch  at 08/02/19 1312  . lip balm (CARMEX) ointment   Topical PRN Blain Pais, MD      . multivitamin with minerals tablet 1 tablet  1 tablet Oral Daily Kyle, Tyrone A, DO      . ondansetron (ZOFRAN) tablet 4 mg  4 mg Oral Q6H PRN Marylyn Ishihara, Tyrone A, DO       Or  . ondansetron (ZOFRAN) injection 4 mg  4 mg Intravenous Q6H PRN Marylyn Ishihara, Tyrone A, DO   4 mg at 08/02/19 0756  . pantoprazole (PROTONIX) EC tablet 40 mg  40 mg Oral Daily Kyle, Tyrone A, DO   40 mg at 08/01/19 1005  . sodium chloride flush (NS) 0.9 % injection 10-40 mL  10-40 mL Intracatheter Q12H Kyle, Tyrone A, DO      . sodium chloride flush (NS) 0.9 % injection 10-40 mL  10-40 mL Intracatheter PRN Marylyn Ishihara, Tyrone A, DO      . vancomycin (VANCOREADY) IVPB 500 mg/100 mL  500 mg Intravenous Q36H Eudelia Bunch, RPH 100 mL/hr at 08/02/19 0514 500 mg at 08/02/19 [id number]     Discharge Medications: Please see discharge summary for a list of discharge medications.  Relevant Imaging Results:  Relevant Lab Results:   Additional Information SSN [id number]  Joaquin Courts, RN

## 2019-08-02 NOTE — Progress Notes (Addendum)
HEMATOLOGY-ONCOLOGY PROGRESS NOTE  SUBJECTIVE: Ms. Axley presented to the emergency room with increased weakness, fatigue, and lethargy.  Family also reported increased confusion, frequent diarrhea, and fever.  She was found to be septic.  She was pancultured and started on IV antibiotics.  Stool for C. difficile and GI panel have been ordered and are pending.  Continues to have fevers.  Has received 1 unit of platelets since admission.  When seen today, the patient intermittently moans out.  She was really unable to speak with me today.  I was unable to obtain a review of systems.  No bleeding noted.  Oncology History  MDS (myelodysplastic syndrome), high grade ([place])  11/13/2018 Bone Marrow Biopsy   1. BM biopsy & aspirate 11/13/18: hypercellular marrow (40-50%) with markedly left shifted erythroid predominance and dysplasia. 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 Negative for increased blasts. Associated flow cytometry demonstrated no discrete population of atypical blasts. The findings were consistent with myelodysplastic syndrome with multi-lineage dysplasia (MDS-MLD). While the aspirate smear and IHC staining showed a marked increase in early erythroid precursors, the strict criteria for acute erythroleukemia were NOT met. However, close clinical follow up is required as further disease progression may occur. MDS FISH panel showed 35.5% of cells showed monosomy 5 by interphase FISH; 2-3.5% showed signal patterns consistent with additional copies of chromosomes 7, 8, and 20. The significance, if any, of this low level cell population is uncertain.    02/06/2019 -  Chemotherapy   Monhtly Decitibine as inpatient for 5 days starting on 02/06/19 at [facility]. Plan for 4-6 months.     02/18/2019 Initial Diagnosis   MDS (myelodysplastic syndrome), high grade ([place])      REVIEW OF SYSTEMS:   Unable to obtain secondary to patient condition.  I have reviewed the past medical history, past  surgical history, social history and family history with the patient and they are unchanged from previous note.   PHYSICAL EXAMINATION: ECOG PERFORMANCE STATUS: 3 - Symptomatic, >50% confined to bed  Vitals:   08/02/19 0837 08/02/19 0838  BP: (!) 112/49   Pulse: 92 (!) 59  Resp: 16   Temp: 98.2 F (36.8 C)   SpO2: (!) 84% 98%   Filed Weights   07/31/19 0919  Weight: 193 lb 9 oz (87.8 kg)    Intake/Output from previous day: 01/03 0701 - 01/04 0700 In: 890.8 [P.O.:120; I.V.:482.8; Blood:188; IV Piggyback:100] Out: -   GENERAL: Alert, moans out on occasion, does not answer questions SKIN: skin color, texture, turgor are normal, no rashes or significant lesions EYES: normal, Conjunctiva are pink and non-injected, sclera clear OROPHARYNX:no exudate, no erythema and lips, buccal mucosa, and tongue normal  NECK: supple, thyroid normal size, non-tender, without nodularity LYMPH:  no palpable lymphadenopathy in the cervical, axillary or inguinal LUNGS: clear to auscultation and percussion with normal breathing effort HEART: regular rate & rhythm and no murmurs and no lower extremity edema ABDOMEN:abdomen soft, non-tender and normal bowel sounds Musculoskeletal:no cyanosis of digits and no clubbing  NEURO: Alert  LABORATORY DATA:  I have reviewed the data as listed CMP Latest Ref Rng & Units 08/02/2019 08/01/2019 07/31/2019  Glucose 70 - 99 mg/dL 108(H) 145(H) 134(H)  BUN 8 - 23 mg/dL 44(H) 41(H) 41(H)  Creatinine 0.44 - 1.00 mg/dL 2.51(H) 2.60(H) 2.78(H)  Sodium 135 - 145 mmol/L 143 138 138  Potassium 3.5 - 5.1 mmol/L 4.1 4.4 4.7  Chloride 98 - 111 mmol/L 117(H) 110 105  CO2 22 - 32 mmol/L  16(L) 18(L) 20(L)  Calcium 8.9 - 10.3 mg/dL 8.2(L) 7.9(L) 9.0  Total Protein 6.5 - 8.1 g/dL 5.6(L) - 6.5  Total Bilirubin 0.3 - 1.2 mg/dL 0.7 - 0.8  Alkaline Phos 38 - 126 U/L 73 - 71  AST 15 - 41 U/L 36 - 35  ALT 0 - 44 U/L 20 - 24    Lab Results  Component Value Date   WBC 24.8 (H)  08/02/2019   HGB 7.2 (L) 08/02/2019   HCT 22.8 (L) 08/02/2019   MCV 91.6 08/02/2019   PLT 34 (L) 08/02/2019   NEUTROABS 21.8 (H) 08/02/2019    DG Chest 2 View  Result Date: 07/29/2019 CLINICAL DATA:  Cough for 1 week X EXAM: CHEST - 2 VIEW COMPARISON:  05/29/2019 FINDINGS: Right-sided chest port remains in place with distal tip terminating at the superior cavoatrial junction. The the heart size and mediastinal contours are within normal limits. Both lungs are clear. The visualized skeletal structures are unremarkable. IMPRESSION: No active cardiopulmonary disease. Electronically Signed   By: Davina Poke D.O.   On: 07/29/2019 13:54   DG Chest Portable 1 View  Result Date: 07/31/2019 CLINICAL DATA:  Altered mental status and weakness EXAM: PORTABLE CHEST 1 VIEW COMPARISON:  07/29/2019; 05/29/2019 FINDINGS: Grossly unchanged cardiac silhouette and mediastinal contours. Stable positioning of support apparatus. No focal airspace opacities. No pleural effusion or pneumothorax. No evidence of edema. No acute osseous abnormalities. IMPRESSION: No acute cardiopulmonary disease. Electronically Signed   By: Sandi Mariscal M.D.   On: 07/31/2019 10:38   ECHOCARDIOGRAM COMPLETE  Result Date: 08/01/2019   ECHOCARDIOGRAM REPORT   Patient Name:   KARIELLE DAVIDOW Date of Exam: 08/01/2019 Medical Rec #:  [id number]     Height:       64.0 in Accession #:    [id number]    Weight:       193.6 lb Date of Birth:  Nov 14, 1932    BSA:          1.93 m Patient Age:    86 years      BP:           137/51 mmHg Patient Gender: F             HR:           101 bpm. Exam Location:  Inpatient Procedure: 2D Echo, Cardiac Doppler and Color Doppler Indications:    Acute Cornonary Syndrome I24.9  History:        Patient has no prior history of Echocardiogram examinations.                 Risk Factors:Non-Smoker. Sepsis. Elevated troponin.  Sonographer:    Paulita Fujita RDCS Referring Phys: [id number] [facility] [place]  Sonographer Comments:  Technically difficult study due to poor echo windows. IMPRESSIONS  1. Left ventricular ejection fraction, by visual estimation, is 70 to 75%. The left ventricle has hyperdynamic function. There is mildly increased left ventricular hypertrophy. Turbulent LVOT flow without obvious gradient.  2. Left ventricular diastolic parameters are consistent with Grade I diastolic dysfunction (impaired relaxation).  3. The left ventricle has no regional wall motion abnormalities.  4. Global right ventricle has normal systolic function.The right ventricular size is normal. No increase in right ventricular wall thickness.  5. Left atrial size was normal.  6. Right atrial size was normal.  7. Mild mitral annular calcification.  8. The mitral valve is grossly normal. Trivial mitral valve regurgitation.  9. The tricuspid valve is grossly  normal. 10. The aortic valve is tricuspid. Aortic valve regurgitation is not visualized. 11. The pulmonic valve was grossly normal. Pulmonic valve regurgitation is trivial. 12. The inferior vena cava is normal in size with greater than 50% respiratory variability, suggesting right atrial pressure of 3 mmHg. 13. TR signal is inadequate for assessing pulmonary artery systolic pressure. FINDINGS  Left Ventricle: Left ventricular ejection fraction, by visual estimation, is 70 to 75%. The left ventricle has hyperdynamic function. The left ventricle has no regional wall motion abnormalities. The left ventricular internal cavity size was the left ventricle is normal in size. There is mildly increased left ventricular hypertrophy. Left ventricular diastolic parameters are consistent with Grade I diastolic dysfunction (impaired relaxation). Right Ventricle: The right ventricular size is normal. No increase in right ventricular wall thickness. Global RV systolic function is has normal systolic function. Left Atrium: Left atrial size was normal in size. Right Atrium: Right atrial size was normal in size  Pericardium: There is no evidence of pericardial effusion. Mitral Valve: The mitral valve is grossly normal. Mild mitral annular calcification. Trivial mitral valve regurgitation. Tricuspid Valve: The tricuspid valve is grossly normal. Tricuspid valve regurgitation is trivial. Aortic Valve: The aortic valve is tricuspid. Aortic valve regurgitation is not visualized. Mild aortic valve annular calcification. Pulmonic Valve: The pulmonic valve was grossly normal. Pulmonic valve regurgitation is trivial. Pulmonic regurgitation is trivial. Aorta: The aortic root is normal in size and structure. Venous: The inferior vena cava is normal in size with greater than 50% respiratory variability, suggesting right atrial pressure of 3 mmHg. IAS/Shunts: No atrial level shunt detected by color flow Doppler.  LEFT VENTRICLE PLAX 2D LVOT diam:     1.70 cm  Diastology LVOT Area:     2.27 cm LV e' lateral:   5.87 cm/s                         LV E/e' lateral: 17.2                         LV e' medial:    5.11 cm/s                         LV E/e' medial:  19.8  LEFT ATRIUM             Index LA Vol (A2C):   24.2 ml 12.54 ml/m LA Vol (A4C):   36.0 ml 18.66 ml/m LA Biplane Vol: 31.1 ml 16.12 ml/m  AORTIC VALVE LVOT Vmax:   105.00 cm/s LVOT Vmean:  79.900 cm/s LVOT VTI:    0.248 m MITRAL VALVE MV Area (PHT): 1.63 cm              SHUNTS MV PHT:        134.85 msec           Systemic VTI:  0.25 m MV Decel Time: 465 msec              Systemic Diam: 1.70 cm MV E velocity: 101.00 cm/s 103 cm/s MV A velocity: 160.00 cm/s 70.3 cm/s MV E/A ratio:  0.63        1.5  Rozann Lesches MD Electronically signed by Rozann Lesches MD Signature Date/Time: 08/01/2019/12:22:02 PM    Final     ASSESSMENT AND PLAN: 1.  MDS with pancytopenia, R-IPSS 4.0, intermediate risk 2.  Sepsis 3.  Diarrhea 4.  AKI on  CKD 5.  Thrombocytopenia 6.  Anemia 7.  Polymyositis 8. Metabolic encephalopathy   -The patient has been on oral decitabine for treatment of her  MDS.  She has had a declining performance status since starting this oral medication and the patient and her son want to switch back to IV decitabine.  Will reach out to Dr. Leretha Pol at [facility] regarding this. -Await blood cultures and C. difficile and GI cultures.  Antibiotics per hospitalist. -Continue fluids per hospitalist. -Platelet count 34,000 today.  No active bleeding noted.  Recommend platelet transfusion for platelet count less than 20,000 or active bleeding. -Hemoglobin is 7.2 today.  Recommend PRBC transfusion for hemoglobin less than 8. -Continue prednisone and morphine for polymyositis   LOS: 2 days   Mikey Bussing, DNP, AGPCNP-BC, AOCNP 08/02/19  Addendum  I have seen the patient, examined her. I agree with the assessment and and plan and have edited the notes.   [place] was lethargic and unresponsive when I saw her, her VS were WNL. I spoke with her nurse at bedside, she received second dose Dilaudid about 2 hrs ago and has been like this since then, she was awake, drowsy, moaning for pain, did not follow commands before the Dilaudid dose.  And she had a similar AMS this morning after first dose dilaudid.  I think her encephalopathy is a combination of sepsis and pain medication.  I called her son, and updated him about her CT brain, renal ultrasound, and culture results.  Patient is waiting for bed at [facility], she will be transferred to the rheumatology service as soon as bed is available.  I answered all her son's questions, and he appreciated the call.   I also called her primary oncologist Dr. Leretha Pol at the [facility] earlier this morning, waiting for his call back. Will definitely hold her chemo which is due this week, and continue blood transfusion and supportive care.  I will be out of office tomorrow, will f/u on Wednesday if she is still here.  Truitt Merle  08/02/2019

## 2019-08-03 ENCOUNTER — Inpatient Hospital Stay (HOSPITAL_COMMUNITY)
Admit: 2019-08-03 | Discharge: 2019-08-03 | Disposition: A | Discharge status: Home / Self Care | Payer: Medicare Other | Attending: Neurology | Admitting: Neurology

## 2019-08-03 ENCOUNTER — Encounter (HOSPITAL_COMMUNITY): Payer: Self-pay | Admitting: Internal Medicine

## 2019-08-03 ENCOUNTER — Other Ambulatory Visit (HOSPITAL_COMMUNITY): Payer: Medicare Other

## 2019-08-03 DIAGNOSIS — Z888 Allergy status to other drugs, medicaments and biological substances status: Secondary | ICD-10-CM

## 2019-08-03 DIAGNOSIS — R4182 Altered mental status, unspecified: Secondary | ICD-10-CM

## 2019-08-03 DIAGNOSIS — D469 Myelodysplastic syndrome, unspecified: Secondary | ICD-10-CM

## 2019-08-03 DIAGNOSIS — D72829 Elevated white blood cell count, unspecified: Secondary | ICD-10-CM

## 2019-08-03 DIAGNOSIS — G9341 Metabolic encephalopathy: Secondary | ICD-10-CM

## 2019-08-03 DIAGNOSIS — Z7952 Long term (current) use of systemic steroids: Secondary | ICD-10-CM

## 2019-08-03 DIAGNOSIS — G9340 Encephalopathy, unspecified: Secondary | ICD-10-CM

## 2019-08-03 LAB — TYPE AND SCREEN
ABO/RH(D): A POS
Antibody Screen: NEGATIVE
Unit division: 0

## 2019-08-03 LAB — COMPREHENSIVE METABOLIC PANEL
ALT: 21 U/L (ref 0–44)
AST: 34 U/L (ref 15–41)
Albumin: 2.1 g/dL — ABNORMAL LOW (ref 3.5–5.0)
Alkaline Phosphatase: 84 U/L (ref 38–126)
Anion gap: 11 (ref 5–15)
BUN: 57 mg/dL — ABNORMAL HIGH (ref 8–23)
CO2: 15 mmol/L — ABNORMAL LOW (ref 22–32)
Calcium: 8.2 mg/dL — ABNORMAL LOW (ref 8.9–10.3)
Chloride: 119 mmol/L — ABNORMAL HIGH (ref 98–111)
Creatinine, Ser: 2.72 mg/dL — ABNORMAL HIGH (ref 0.44–1.00)
GFR calc Af Amer: 18 mL/min — ABNORMAL LOW (ref >60)
GFR calc non Af Amer: 15 mL/min — ABNORMAL LOW (ref >60)
Glucose, Bld: 140 mg/dL — ABNORMAL HIGH (ref 70–99)
Potassium: 5.4 mmol/L — ABNORMAL HIGH (ref 3.5–5.1)
Sodium: 145 mmol/L (ref 135–145)
Total Bilirubin: 1 mg/dL (ref 0.3–1.2)
Total Protein: 5.8 g/dL — ABNORMAL LOW (ref 6.5–8.1)

## 2019-08-03 LAB — CBC WITH DIFFERENTIAL/PLATELET
Abs Immature Granulocytes: 0 10*3/uL (ref 0.00–0.07)
Band Neutrophils: 3 %
Basophils Absolute: 0 10*3/uL (ref 0.0–0.1)
Basophils Relative: 0 %
Blasts: 9 %
Eosinophils Absolute: 0 10*3/uL (ref 0.0–0.5)
Eosinophils Relative: 0 %
HCT: 26.3 % — ABNORMAL LOW (ref 36.0–46.0)
Hemoglobin: 8.4 g/dL — ABNORMAL LOW (ref 12.0–15.0)
Lymphocytes Relative: 7 %
Lymphs Abs: 2.5 10*3/uL (ref 0.7–4.0)
MCH: 28.9 pg (ref 26.0–34.0)
MCHC: 31.9 g/dL (ref 30.0–36.0)
MCV: 90.4 fL (ref 80.0–100.0)
Monocytes Absolute: 1.4 10*3/uL — ABNORMAL HIGH (ref 0.1–1.0)
Monocytes Relative: 4 %
Neutro Abs: 28.4 10*3/uL — ABNORMAL HIGH (ref 1.7–7.7)
Neutrophils Relative %: 77 %
Platelets: 57 10*3/uL — ABNORMAL LOW (ref 150–400)
RBC: 2.91 MIL/uL — ABNORMAL LOW (ref 3.87–5.11)
RDW: 17.4 % — ABNORMAL HIGH (ref 11.5–15.5)
WBC: 35.5 10*3/uL — ABNORMAL HIGH (ref 4.0–10.5)
nRBC: 0 % (ref 0.0–0.2)

## 2019-08-03 LAB — BASIC METABOLIC PANEL
Anion gap: 13 (ref 5–15)
BUN: 78 mg/dL — ABNORMAL HIGH (ref 8–23)
CO2: 14 mmol/L — ABNORMAL LOW (ref 22–32)
Calcium: 8.2 mg/dL — ABNORMAL LOW (ref 8.9–10.3)
Chloride: 121 mmol/L — ABNORMAL HIGH (ref 98–111)
Creatinine, Ser: 3.39 mg/dL — ABNORMAL HIGH (ref 0.44–1.00)
GFR calc Af Amer: 14 mL/min — ABNORMAL LOW (ref >60)
GFR calc non Af Amer: 12 mL/min — ABNORMAL LOW (ref >60)
Glucose, Bld: 163 mg/dL — ABNORMAL HIGH (ref 70–99)
Potassium: 5.3 mmol/L — ABNORMAL HIGH (ref 3.5–5.1)
Sodium: 148 mmol/L — ABNORMAL HIGH (ref 135–145)

## 2019-08-03 LAB — BPAM RBC
Blood Product Expiration Date: 202101222359
ISSUE DATE / TIME: 202101041330
Unit Type and Rh: 6200

## 2019-08-03 LAB — CBC
HCT: 25.9 % — ABNORMAL LOW (ref 36.0–46.0)
Hemoglobin: 8.4 g/dL — ABNORMAL LOW (ref 12.0–15.0)
MCH: 29.3 pg (ref 26.0–34.0)
MCHC: 32.4 g/dL (ref 30.0–36.0)
MCV: 90.2 fL (ref 80.0–100.0)
Platelets: 45 10*3/uL — ABNORMAL LOW (ref 150–400)
RBC: 2.87 MIL/uL — ABNORMAL LOW (ref 3.87–5.11)
RDW: 17.4 % — ABNORMAL HIGH (ref 11.5–15.5)
WBC: 38.5 10*3/uL — ABNORMAL HIGH (ref 4.0–10.5)
nRBC: 0 % (ref 0.0–0.2)

## 2019-08-03 LAB — VITAMIN B12: Vitamin B-12: 1386 pg/mL — ABNORMAL HIGH (ref 180–914)

## 2019-08-03 LAB — AMMONIA: Ammonia: 44 umol/L — ABNORMAL HIGH (ref 9–35)

## 2019-08-03 LAB — VALPROIC ACID LEVEL: Valproic Acid Lvl: <10 ug/mL — ABNORMAL LOW (ref 50.0–100.0)

## 2019-08-03 LAB — GLUCOSE, CAPILLARY: Glucose-Capillary: 117 mg/dL — ABNORMAL HIGH (ref 70–99)

## 2019-08-03 LAB — PATHOLOGIST SMEAR REVIEW

## 2019-08-03 MED ORDER — CHLORHEXIDINE GLUCONATE 0.12 % MT SOLN
15.0000 mL | Freq: Two times a day (BID) | OROMUCOSAL | Status: DC
  Administered 2019-08-03 – 2019-08-06 (×3): 15 mL via OROMUCOSAL
  Filled 2019-08-03 (×3): qty 15

## 2019-08-03 MED ORDER — DEXAMETHASONE SODIUM PHOSPHATE 10 MG/ML IJ SOLN
6.0000 mg | INTRAMUSCULAR | Status: DC
  Administered 2019-08-04 – 2019-08-05 (×2): 6 mg via INTRAVENOUS
  Filled 2019-08-03 (×2): qty 1

## 2019-08-03 MED ORDER — HALOPERIDOL LACTATE 5 MG/ML IJ SOLN: 1.0000 mg | Freq: Four times a day (QID) | INTRAMUSCULAR | Status: DC | PRN

## 2019-08-03 MED ORDER — ORAL CARE MOUTH RINSE
15.0000 mL | Freq: Two times a day (BID) | OROMUCOSAL | Status: DC
  Administered 2019-08-03 – 2019-08-05 (×4): 15 mL via OROMUCOSAL

## 2019-08-03 MED ORDER — HALOPERIDOL LACTATE 5 MG/ML IJ SOLN
5.0000 mg | Freq: Four times a day (QID) | INTRAMUSCULAR | Status: DC | PRN
  Administered 2019-08-03 – 2019-08-04 (×3): 5 mg via INTRAVENOUS
  Filled 2019-08-03 (×3): qty 1

## 2019-08-03 MED ORDER — HALOPERIDOL LACTATE 5 MG/ML IJ SOLN
5.0000 mg | Freq: Four times a day (QID) | INTRAMUSCULAR | Status: DC | PRN
  Administered 2019-08-03: 5 mg via INTRAMUSCULAR
  Filled 2019-08-03: qty 1

## 2019-08-03 MED ORDER — SODIUM BICARBONATE 8.4 % IV SOLN
INTRAVENOUS | Status: DC
  Filled 2019-08-03 (×6): qty 100

## 2019-08-03 MED ORDER — HYDROMORPHONE HCL 1 MG/ML IJ SOLN: 0.5000 mg | INTRAMUSCULAR | Status: DC | PRN

## 2019-08-03 MED ORDER — HYDROMORPHONE HCL 1 MG/ML IJ SOLN
0.5000 mg | INTRAMUSCULAR | Status: DC | PRN
  Administered 2019-08-03 – 2019-08-04 (×14): 0.5 mg via INTRAVENOUS
  Filled 2019-08-03 (×14): qty 0.5

## 2019-08-03 NOTE — Progress Notes (Addendum)
Progress Note    Heather Olson  PRF:[id number] DOB: 1932/08/25  DOA: 07/31/2019 PCP: Johnston Ebbs, MD      Brief Narrative:    Medical records reviewed and are as summarized below:  Heather Olson is an 86 y.o. female medical history significant ofMDS, myositis. Presenting with weakness for 3 days. Hx per son. He reports that Thursday he noted his mom was fatigued and lethargic. He took her to her oncologist, who was concerned for infection. Was given abx. He noticed over the next two days that she had increased weakness to the point that she couldn't walk or use her rollator. He noticed that she was confused. He noticed that she had frequent diarrhea. He became concerned because she had fevers and brought her to [facility]. She was found to be septic. Started on vanc/cefepime. Found to have elevated troponin. Cardiology notified. Her Oncologist team was notified.       Assessment/Plan:   Active Problems:   MDS (myelodysplastic syndrome), high grade (HCC)   Sepsis due to undetermined organism (HCC)   Weakness   Diarrhea   AKI (acute kidney injury) ([place])   Elevated troponin   CKD (chronic kidney disease), stage III   Body mass index is 33.23 kg/m.   Confusion/delirium/agitation: Etiology unclear but this is probably due to delirium.  Consulted neurologist, Dr. Lorraine Lax, for further evaluation.  Sepsis/recent fever/leukocytosis: Continue empiric IV antibiotics.  No growth on blood cultures thus far.  Continue IV fluids for hydration.  Consulted infectious disease, Dr. Talbot Grumbling  AKI on CKD stage III, hyperkalemia: Repeat BMP.  Elevated troponin: Elevated troponin likely due to demand ischemia.  2D echo shows normal EF and grade 1 diastolic dysfunction.  She has been seen by cardiologist and cardiology team has signed off.  Generalized weakness/polymyositis: Continue IV dexamethasone.  Patient will be transferred to rheumatology service at [facility] once a bed  becomes available.  Her son had requested transfer.  MDS with chronic anemia and thrombocytopenia: Status post transfusion with 1 unit of platelets and 1 unit of packed red blood cells.  The CBC  Severe leukocytosis: This may be partly due to steroids.  Continue to monitor.  Family Communication/Anticipated D/C date and plan/Code Status   DVT prophylaxis: SCD Code Status: DNR Family Communication: Discussed with her son at the bedside Disposition Plan: Awaiting transfer to [facility]:   Patient has had intermittent confusion and agitation and has been yelling intermittently.  She is unable to provide any history.  I met with her son at the bedside who requested that patient be sedated because he could not stand seen his mother in this state.    Objective:    Vitals:   08/03/19 0046 08/03/19 0110 08/03/19 0347 08/03/19 1223  BP: 133/64 (!) 117/58 122/69 (!) 139/48  Pulse: 100 91 98 (!) 101  Resp: '15 18 14 16  '$ Temp: 97.9 F (36.6 C) 97.6 F (36.4 C) 97.6 F (36.4 C) 98 F (36.7 C)  TempSrc: Axillary Axillary Axillary Oral  SpO2: 96% 98% 97% 95%  Weight:      Height:        Intake/Output Summary (Last 24 hours) at 08/03/2019 1623 Last data filed at 08/03/2019 1400 Gross per 24 hour  Intake 3659.64 ml  Output 325 ml  Net 3334.64 ml   Filed Weights   07/31/19 0919  Weight: 87.8 kg    Exam:  GEN: NAD SKIN: No rash  EYES: No pallor or icterus ENT: MMM CV: RRR PULM: CTA B ABD: soft, ND, NT, +BS CNS: Patient is confused with intermittent agitation and yelling EXT: No edema or tenderness   Data Reviewed:   I have personally reviewed following labs and imaging studies:  Labs: Labs show the following:   Basic Metabolic Panel: Recent Labs  Lab 07/31/19 0914 08/01/19 1244 08/02/19 0357 08/03/19 0344  NA 138 138 143 145  K 4.7 4.4 4.1 5.4*  CL 105 110 117* 119*  CO2 20* 18* 16* 15*  GLUCOSE 134* 145* 108* 140*  BUN 41* 41* 44* 57*    CREATININE 2.78* 2.60* 2.51* 2.72*  CALCIUM 9.0 7.9* 8.2* 8.2*   GFR Estimated Creatinine Clearance: 16.5 mL/min (A) (by C-G formula based on SCr of 2.72 mg/dL (H)). Liver Function Tests: Recent Labs  Lab 07/31/19 0914 08/02/19 0357 08/03/19 0344  AST 35 36 34  ALT '24 20 21  '$ ALKPHOS 71 73 84  BILITOT 0.8 0.7 1.0  PROT 6.5 5.6* 5.8*  ALBUMIN 2.6* 2.1* 2.1*   Recent Labs  Lab 07/31/19 0936  LIPASE 27   No results for input(s): AMMONIA in the last 168 hours. Coagulation profile Recent Labs  Lab 07/31/19 1000 08/01/19 0500  INR 1.6* 1.7*    CBC: Recent Labs  Lab 07/29/19 1149 07/29/19 1149 07/31/19 0914 08/01/19 1244 08/02/19 0357 08/02/19 1856 08/03/19 0344 08/03/19 0629  WBC 8.8   < > 18.2* 19.5* 24.8* 29.7* 35.5* 38.5*  NEUTROABS 5.1  --  10.3*  --  21.8*  --  28.4*  --   HGB 9.7*   < > 9.9* 7.5* 7.2* 8.1* 8.4* 8.4*  HCT 28.2*  --  29.3* 23.7* 22.8* 25.2* 26.3* 25.9*  MCV 84.4  --  87.2 90.5 91.6 90.3 90.4 90.2  PLT 20*   < > 14* 10* 34* 19* 57* 45*   < > = values in this interval not displayed.   Cardiac Enzymes: Recent Labs  Lab 08/01/19 1306  CKTOTAL 81   BNP (last 3 results) No results for input(s): PROBNP in the last 8760 hours. CBG: Recent Labs  Lab 07/31/19 1010 08/01/19 0413 08/02/19 1028 08/03/19 0102  GLUCAP 81 96 88 117*   D-Dimer: No results for input(s): DDIMER in the last 72 hours. Hgb A1c: No results for input(s): HGBA1C in the last 72 hours. Lipid Profile: No results for input(s): CHOL, HDL, LDLCALC, TRIG, CHOLHDL, LDLDIRECT in the last 72 hours. Thyroid function studies: No results for input(s): TSH, T4TOTAL, T3FREE, THYROIDAB in the last 72 hours.  Invalid input(s): FREET3 Anemia work up: No results for input(s): VITAMINB12, FOLATE, FERRITIN, TIBC, IRON, RETICCTPCT in the last 72 hours. Sepsis Labs: Recent Labs  Lab 07/31/19 0923 08/01/19 0500 08/02/19 0357 08/02/19 1856 08/03/19 0344 08/03/19 0629   PROCALCITON  --  18.80  --   --   --   --   WBC  --   --  24.8* 29.7* 35.5* 38.5*  LATICACIDVEN 1.6  --   --   --   --   --     Microbiology Recent Results (from the past 240 hour(s))  Urine culture     Status: None   Collection Time: 07/31/19  2:36 PM   Specimen: In/Out Cath Urine  Result Value Ref Range Status   Specimen Description   Final    IN/OUT CATH URINE Performed at [facility], [place] [address], [place], [place] [address]    Special Requests  Final    NONE Performed at [facility], [place] [address] [place], [place] [address]    Culture   Final    NO GROWTH Performed at [facility], [place] [address] [place], [place] [address]    Report Status 08/01/2019 FINAL  Final  SARS CORONAVIRUS 2 (TAT 6-24 HRS) Nasopharyngeal Nasopharyngeal Swab     Status: None   Collection Time: 07/31/19 10:25 PM   Specimen: Nasopharyngeal Swab  Result Value Ref Range Status   SARS Coronavirus 2 NEGATIVE NEGATIVE Final    Comment: (NOTE) SARS-CoV-2 target nucleic acids are NOT DETECTED. The SARS-CoV-2 RNA is generally detectable in upper and lower respiratory specimens during the acute phase of infection. Negative results do not preclude SARS-CoV-2 infection, do not rule out co-infections with other pathogens, and should not be used as the sole basis for treatment or other patient management decisions. Negative results must be combined with clinical observations, patient history, and epidemiological information. The expected result is Negative. Fact Sheet for Patients: SugarRoll.be Fact Sheet for Healthcare Providers: https://www.woods-mathews.com/ This test is not yet approved or cleared by the [place] FDA and  has been authorized for detection and/or diagnosis of SARS-CoV-2 by FDA under an Emergency Use Authorization (EUA). This EUA will remain  in effect (meaning this test can be used) for the  duration of the COVID-19 declaration under Section 56 4(b)(1) of the Act, 21 U.S.C. section 360bbb-3(b)(1), unless the authorization is terminated or revoked sooner. Performed at [facility], [place] [address] [place], [place] [address]   Blood Culture (routine x 2)     Status: None (Preliminary result)   Collection Time: 08/01/19  6:08 AM   Specimen: BLOOD  Result Value Ref Range Status   Specimen Description   Final    BLOOD LEFT ANTECUBITAL Performed at [facility] [address], [place], [place] [address]    Special Requests   Final    BOTTLES DRAWN AEROBIC ONLY Blood Culture adequate volume Performed at [facility] [address] [place], [place] [address]    Culture   Final    NO GROWTH 2 DAYS Performed at [facility] [address], [place], [place] [address]    Report Status PENDING  Incomplete  Blood Culture (routine x 2)     Status: None (Preliminary result)   Collection Time: 08/01/19  6:08 AM   Specimen: BLOOD  Result Value Ref Range Status   Specimen Description   Final    BLOOD BLOOD LEFT HAND Performed at [facility] [address], [place], [place] [address]    Special Requests   Final    BOTTLES DRAWN AEROBIC ONLY Blood Culture adequate volume Performed at [facility] [address] [place], [place] [address]    Culture   Final    NO GROWTH 2 DAYS Performed at [facility] [address], [place], [place] [address]    Report Status PENDING  Incomplete    Procedures and diagnostic studies:  CT HEAD WO CONTRAST  Result Date: 08/02/2019 CLINICAL DATA:  86 year old female with lethargy, decreased respiratory effort, delirium. EXAM: CT HEAD WITHOUT CONTRAST TECHNIQUE: Contiguous axial images were obtained from the base of the skull through the vertex without intravenous contrast. COMPARISON:  Head CT 10/24/2018 and earlier. FINDINGS: Brain: Streak  artifact from right cochlear implant device again noted. Cerebral volume is stable since 2020 and within normal limits  for age. No midline shift, ventriculomegaly, mass effect, evidence of mass lesion, intracranial hemorrhage or evidence of cortically based acute infarction. Questionable increased hypodensity in the left basal ganglia and internal capsule compared to 10/24/2018 (series 2, image 16) this is in the region of streak artifact. Elsewhere cerebral white matter hypodensity appears stable. Vascular: Calcified atherosclerosis at the skull base. No suspicious intracranial vascular hyperdensity. Skull: No acute osseous abnormality identified. Sinuses/Orbits: Previous right mastoidectomy with improved right tympanic cavity and mastoid defect pneumatization since 2020. Other Visualized paranasal sinuses and mastoids are stable and well pneumatized. Other: Right side cochlear implant with generator device along the right posterior convexity and associated streak artifact. No acute orbit or scalp soft tissue finding. IMPRESSION: 1. Streak artifact related to chronic right cochlear implant with real versus artifactual new hypodensity in the left basal ganglia since 10/24/2018. If there is no right side weakness/deficits then artifact would be favored. 2. Otherwise stable noncontrast CT appearance of the brain. Electronically Signed   By: Genevie Ann M.D.   On: 08/02/2019 11:58   US RENAL  Result Date: 08/02/2019 CLINICAL DATA:  86 year old female with renal failure. EXAM: RENAL / URINARY TRACT ULTRASOUND COMPLETE COMPARISON:  Abdomen ultrasound 05/08/2013. FINDINGS: Right Kidney: Renal measurements: 9.5 x 3.9 x 4.7 centimeters = volume: 90 mL. No right hydronephrosis or solid renal mass. Small 2 centimeter right upper pole cyst was not apparent in 2014. the right renal cortex appears echogenic compared to the liver on image 5. Left Kidney: Renal measurements: 10.1 x 4.4 x 4.4 centimeters = volume: 101 mL. Echogenic  left kidney similar to that on the right. No mass or hydronephrosis visualized. Bladder: Diminutive.  Appears normal for degree of bladder distention. Other: None. IMPRESSION: No acute renal findings. Evidence of some chronic medical renal disease. Electronically Signed   By: Genevie Ann M.D.   On: 08/02/2019 13:48   DG CHEST PORT 1 VIEW  Result Date: 08/02/2019 CLINICAL DATA:  Dyspnea EXAM: PORTABLE CHEST 1 VIEW COMPARISON:  07/31/2019 FINDINGS: Cardiomegaly. Right chest port catheter. Diffuse bilateral interstitial pulmonary opacity and new, small layering bilateral pleural effusions. The visualized skeletal structures are unremarkable. IMPRESSION: 1. New, small bilateral pleural effusions. 2. Diffuse bilateral interstitial pulmonary opacity, most consistent with pulmonary edema. 3. Cardiomegaly. Electronically Signed   By: Eddie Candle M.D.   On: 08/02/2019 16:27    Medications:   . sodium chloride   Intravenous Once  . acyclovir  400 mg Oral BID  . ascorbic acid  500 mg Oral Daily  . chlorhexidine  15 mL Mouth Rinse BID  . Chlorhexidine Gluconate Cloth  6 each Topical Daily  . [START ON 08/04/2019] dexamethasone (DECADRON) injection  6 mg Intravenous Q24H  . docusate sodium  100 mg Oral BID  . lidocaine  2 patch Transdermal Q24H  . mouth rinse  15 mL Mouth Rinse q12n4p  . multivitamin with minerals  1 tablet Oral Daily  . pantoprazole  40 mg Oral Daily  . sodium chloride flush  10-40 mL Intracatheter Q12H   Continuous Infusions: . sodium chloride 75 mL/hr at 08/03/19 1509  . ceFEPime (MAXIPIME) IV 2 g (08/03/19 0958)  . vancomycin 500 mg (08/02/19 0514)     LOS: 3 days   Demani Mcbrien  [facility]   *Please refer to [email or web address], password [email or web address] to get updated schedule on who will round on this patient, as hospitalists switch teams weekly. If 7PM-7AM, please contact night-coverage at [email or web address], password [email or web address] for any overnight needs.  08/03/2019, 4:23 PM

## 2019-08-03 NOTE — Care Management Important Message (Signed)
Important Message  Patient Details IM Letter given to Dessa Phi RN Case Manager to present to the Patient Name: Heather Olson MRN: [id number] Date of Birth: [birth year]   Medicare Important Message Given:  Yes     Kerin Salen 08/03/2019, 11:09 AM

## 2019-08-03 NOTE — Progress Notes (Signed)
Rapid Response Event Note  Overview: Time Called: [id number] Arrival Time: 0103 Event Type: Other (Comment), Neurologic(Nurse concerned about yelling out)  Initial Focused Assessment:  RRT called due to increased confusion and yelling out.  Patient moans out when touched  and staff is unable to redirect at this. Patient will open eyes; does not blink to physical threat, pupils are equal and sluggish. VSS, T 97.6. B/P 117/58, HR 97 Resp 18, SpO2 @ 98 % on 3 L N/C appears to be in pain,per pain scale PAINAD score is a 7.  Patient anuric since 1100 bladder scan at bedside. Paged provider regarding pain, anuric and bladder scan positive for 300 + urine.    Interventions: Bladder scan In and Out urinary catheter Redirect as needed.  Plan of Care (if not transferred):  Event Summary  Tylene Fantasia NP @ [phone]   Outcome: Stayed in room and stabalized Event end Time : 0140  [facility] MSN, RN-BC  [place]

## 2019-08-03 NOTE — Progress Notes (Signed)
OT Cancellation Note  Patient Details Name: Heather Olson MRN: [id number] DOB: 1933/07/23   Cancelled Treatment:    Reason Eval/Treat Not Completed: Other (comment).  Spoke to [employer]. Pt not really responding and awaiting bed at [facility].  If status changes and pt remains, please reorder OT.  Lynelle Weiler 08/03/2019, 9:09 AM  Karsten Ro, OTR/L Acute Rehabilitation Services 08/03/2019

## 2019-08-03 NOTE — Progress Notes (Addendum)
IM Haldol 5mg  given at 1109. Pt has continued to yell out since administering. Appears almost more uncomfortable/agitated than before. Gave PRN Dilaudid IV for pain. MD made aware that Haldol was not effective. Will continue to monitor.

## 2019-08-03 NOTE — Procedures (Signed)
Patient Name: Heather Olson  MRN: [id number]  Epilepsy Attending: Lora Havens  Referring Physician/Provider: Dr Remi Haggard Aroor Date: 08/03/2019 Duration: 24.53 mins  Patient history: 86yo f with ams.eeg to evaluate for seizure  Level of alertness: awake  AEDs during EEG study: None  Technical aspects: This EEG study was done with scalp electrodes positioned according to the 10-20 International system of electrode placement. Electrical activity was acquired at a sampling rate of 500Hz  and reviewed with a high frequency filter of 70Hz  and a low frequency filter of 1Hz . EEG data were recorded continuously and digitally stored.   DESCRIPTION: No clear posterior dominant rhythm was seen. EEG showed continuous generalized 3-5hz  theta-delta slowing. Triphasic waves at 0.5-1hz , generalized, maximal bifrontal were also noted. Hyperventilation and photic stimulation were not performed.  ABNORMALITY - Continuous slow, generalized - Triphasic waves, generalized  IMPRESSION: This study is suggestive of severe diffuse encephalopathy, non specific to etiology but could be secondary to toxic-metabolic causes, cefepime use. No seizures or definite epileptiform discharges were seen throughout the recording.  Margee Trentham Barbra Sarks

## 2019-08-03 NOTE — Progress Notes (Signed)
Pt has not voided since around 0200 this morning when night shift RN in and out catheterized patient. Bladder scan at 1000 showed 85 cc's. Bladder scan now shows 90 cc's in bladder. Dr. Mal Misty made aware.

## 2019-08-03 NOTE — Consult Note (Addendum)
[facility] for Infectious Disease       Reason for Consult: leukocytosis    Referring Physician: Dr. Mal Misty  Active Problems:   MDS (myelodysplastic syndrome), high grade ([place])   Sepsis due to undetermined organism ([place])   Weakness   Diarrhea   AKI (acute kidney injury) ([place])   Elevated troponin   CKD (chronic kidney disease), stage III   . sodium chloride   Intravenous Once  . acyclovir  400 mg Oral BID  . ascorbic acid  500 mg Oral Daily  . chlorhexidine  15 mL Mouth Rinse BID  . Chlorhexidine Gluconate Cloth  6 each Topical Daily  . [START ON 08/04/2019] dexamethasone (DECADRON) injection  6 mg Intravenous Q24H  . docusate sodium  100 mg Oral BID  . lidocaine  2 patch Transdermal Q24H  . mouth rinse  15 mL Mouth Rinse q12n4p  . multivitamin with minerals  1 tablet Oral Daily  . pantoprazole  40 mg Oral Daily  . sodium chloride flush  10-40 mL Intracatheter Q12H    Recommendations: Stop cefepime and vancomycin MRI of head when able, I doubt she will cooperate at this time  Assessment: She has leukocytosis and encephalopathy of unknown etiology.  Initially with fever as well but has been afebrile today.  Initially with diarrhea but none since admission.  Lactate wnl, COVID negative x 2.  No clear signs of infection.   DDx includes occult infection, reactive leukocytosis, medication induced encephalopathy (including cefepime and narcotics), uremia.  Leukocytosis also could be from MDS, differentiation syndrome, decitabine. Does not appear to have an infection.  Encephalitis possible.  As there is no obvious infection, I will stop cefepime with concern for cefepime-induced encephalopathy and will stop vancomycin with uremia and concern for increased BUN and creat.  Though C diff can lead to significant leukocytosis, she is not having any reported diarrhea making C diff less likely but stopping antibiotics also is indicated with this concern.   Antibiotics: Vancomycin  and cefepime  HPI: Heather Olson is a 86 y.o. female with a history of MDS, myositis, followed by oncology at [facility] and locally, on decitabine since July 2020 and came in with weakness.  Had a fever initially of 102.9, leukocytosis now up to 38.5.  Thought to be septic and started on empiric antibiotics.  Concern for C diff but no stool since admission.  Has been on chronic steroids and was increased initially.  History not available from the patient.    Review of Systems:  Unable to be assessed due to mental status   Past Medical History:  Diagnosis Date  . Arthritis   . GERD (gastroesophageal reflux disease)   . Headache(784.0)   . Hypertension   . Polymyositis ([place])   . Swelling of both ankles     Social History   Tobacco Use  . Smoking status: Never Smoker  . Smokeless tobacco: Never Used  Substance Use Topics  . Alcohol use: No  . Drug use: No    Family History  Problem Relation Age of Onset  . Diabetes Mother   . Cancer Daughter 32       ovarian cancer     Allergies  Allergen Reactions  . Butalbital-Apap-Caffeine Itching    Patient reports itching after taking x 1 tablet, so she stopped taking medication  . Imuran [Azathioprine] Other (See Comments)    Luekemia  . Lisinopril Cough    Physical Exam: Constitutional: no response, crying out Vitals:  08/03/19 0347 08/03/19 1223  BP: 122/69 (!) 139/48  Pulse: 98 (!) 101  Resp: 14 16  Temp: 97.6 F (36.4 C) 98 F (36.7 C)  SpO2: 97% 95%   EYES: anicteric, eyes open ENMT: no thrush Cardiovascular: Cor RRR Respiratory: CTAB [id number] Musculoskeletal: no edema Skin: negatives: no rash Neuro: eyes open but does not respond, cries out every few seconds, does not track with her eyes  Lab Results  Component Value Date   WBC 38.5 (H) 08/03/2019   HGB 8.4 (L) 08/03/2019   HCT 25.9 (L) 08/03/2019   MCV 90.2 08/03/2019   PLT 45 (L) 08/03/2019    Lab Results  Component Value Date   CREATININE 2.72 (H)  08/03/2019   BUN 57 (H) 08/03/2019   NA 145 08/03/2019   K 5.4 (H) 08/03/2019   CL 119 (H) 08/03/2019   CO2 15 (L) 08/03/2019    Lab Results  Component Value Date   ALT 21 08/03/2019   AST 34 08/03/2019   ALKPHOS 84 08/03/2019     Microbiology: Recent Results (from the past 240 hour(s))  Urine culture     Status: None   Collection Time: 07/31/19  2:36 PM   Specimen: In/Out Cath Urine  Result Value Ref Range Status   Specimen Description   Final    IN/OUT CATH URINE Performed at [facility], [place] [address], [place], [place] [address]    Special Requests   Final    NONE Performed at [facility], [place] [address], [place], [place] [address]    Culture   Final    NO GROWTH Performed at [facility], [place] [address] [place], [place] [address]    Report Status 08/01/2019 FINAL  Final  SARS CORONAVIRUS 2 (TAT 6-24 HRS) Nasopharyngeal Nasopharyngeal Swab     Status: None   Collection Time: 07/31/19 10:25 PM   Specimen: Nasopharyngeal Swab  Result Value Ref Range Status   SARS Coronavirus 2 NEGATIVE NEGATIVE Final    Comment: (NOTE) SARS-CoV-2 target nucleic acids are NOT DETECTED. The SARS-CoV-2 RNA is generally detectable in upper and lower respiratory specimens during the acute phase of infection. Negative results do not preclude SARS-CoV-2 infection, do not rule out co-infections with other pathogens, and should not be used as the sole basis for treatment or other patient management decisions. Negative results must be combined with clinical observations, patient history, and epidemiological information. The expected result is Negative. Fact Sheet for Patients: SugarRoll.be Fact Sheet for Healthcare Providers: https://www.woods-mathews.com/ This test is not yet approved or cleared by the [place] FDA and  has been authorized for detection and/or diagnosis of SARS-CoV-2 by FDA under  an Emergency Use Authorization (EUA). This EUA will remain  in effect (meaning this test can be used) for the duration of the COVID-19 declaration under Section 56 4(b)(1) of the Act, 21 U.S.C. section 360bbb-3(b)(1), unless the authorization is terminated or revoked sooner. Performed at [facility], [place] [address], [place], [place] [address]   Blood Culture (routine x 2)     Status: None (Preliminary result)   Collection Time: 08/01/19  6:08 AM   Specimen: BLOOD  Result Value Ref Range Status   Specimen Description   Final    BLOOD LEFT ANTECUBITAL Performed at [place] [address] [place], [place] [address]    Special Requests   Final    BOTTLES DRAWN AEROBIC ONLY Blood Culture adequate volume Performed at  [facility], [place] [address], [place], [place] [address]    Culture   Final    NO GROWTH 2 DAYS Performed at [place] [address], [place], [place] [address]    Report Status PENDING  Incomplete  Blood Culture (routine x 2)     Status: None (Preliminary result)   Collection Time: 08/01/19  6:08 AM   Specimen: BLOOD  Result Value Ref Range Status   Specimen Description   Final    BLOOD BLOOD LEFT HAND Performed at [place] [address], [place], [place] [address]    Special Requests   Final    BOTTLES DRAWN AEROBIC ONLY Blood Culture adequate volume Performed at [place] [address] [place], [place] [address]    Culture   Final    NO GROWTH 2 DAYS Performed at [place] [address], [place], [place] [address]    Report Status PENDING  Incomplete    Thayer Headings, [facility] for Infectious Disease [place] [email or web address] 08/03/2019, 5:38 PM

## 2019-08-03 NOTE — Consult Note (Signed)
Requesting Physician: Dr. Mal Misty    Chief Complaint: Encephalopathy, " screams every 30 seconds" and has been nonverbal.  History obtained from: Patient and Chart    HPI:                                                                                                                                       Heather Olson is a 86 y.o. female with known diagnosis of polymyositis, arthritis, hypertension, myelodysplastic syndrome admitted to [facility] long hospital due to sepsis  Per chart review, patient had 3 days of fatigue.  Son who took her to see her regular colleges was concerned for underlying infection none was given antibiotics.  Patient subsequently became confused, had developed fever and brought her to [facility] long emergency department on 1/2.  Patient was admitted for sepsis work-up:  Blood cultures have been negative so far as well as urine culture. Her stool sample has still not been collected for C. difficile infection.  She is being empirically treated with vancomycin and cefepime.  Patient on oral acyclovir as well. COVID-19 negative.  She has remained afebrile for about 24 hours.  She also received 1 unit of platelets given thrombocytopenia.  Patient also received IV Decadron  and IV Dilaudid for polymyositis.   Past Medical History:  Diagnosis Date  . Arthritis   . GERD (gastroesophageal reflux disease)   . Headache(784.0)   . Hypertension   . Polymyositis ([place])   . Swelling of both ankles     Past Surgical History:  Procedure Laterality Date  . ABDOMINAL HYSTERECTOMY    . APPENDECTOMY    . CHOLECYSTECTOMY N/A 05/28/2013   Procedure: LAPAROSCOPIC CHOLECYSTECTOMY ;  Surgeon: Gwenyth Ober, MD;  Location: [facility] OR;  Service: General;  Laterality: N/A;  . TONSILLECTOMY      Family History  Problem Relation Age of Onset  . Diabetes Mother   . Cancer Daughter 62       ovarian cancer    Social History:  reports that she has never smoked. She has never used smokeless tobacco.  She reports that she does not drink alcohol or use drugs.  Allergies:  Allergies  Allergen Reactions  . Butalbital-Apap-Caffeine Itching    Patient reports itching after taking x 1 tablet, so she stopped taking medication  . Imuran [Azathioprine] Other (See Comments)    Luekemia  . Lisinopril Cough    Medications:  I reviewed home medications   ROS:                                                                                                                                     Unable to review systems due to patient's mental status   Examination:                                                                                                      General: Appears well-developed and well-nourished.  Psych: Obtunded Eyes: No scleral injection HENT: No OP obstrucion Head: Normocephalic.  Cardiovascular: Normal rate and regular rhythm.  Respiratory: Effort normal and breath sounds normal to anterior ascultation GI: Soft.  No distension. There is no tenderness.  Skin: WDI    Neurological Examination Mental Status: Patient is obtunded, does not track examiner and moans to any stimulation.  Not following any commands and is nonverbal Cranial Nerves: II: Visual fields: Difficult to assess, impaired blink to threat bilaterally III,IV, VI: ptosis not present, doll's eye present pupils equal, round, pupils reactive to light and accommodation V,VII: Face appears symmetric, unable to test facial sensation IX,X: Patient does gag XII: midline tongue extension Motor: Withdraws to noxious stimulus in all 4 extremities Tone and bulk:normal tone throughout; no atrophy noted Sensory: Grimaces to painful stimulus bilaterally Deep Tendon Reflexes: Absent reflexes throughout Plantars: Mute bilaterally Cerebellar: Unable to assess due to mental status Gait: Unable to assess  due to mental status     Lab Results: Basic Metabolic Panel: Recent Labs  Lab 07/31/19 0914 08/01/19 1244 08/02/19 0357 08/03/19 0344  NA 138 138 143 145  K 4.7 4.4 4.1 5.4*  CL 105 110 117* 119*  CO2 20* 18* 16* 15*  GLUCOSE 134* 145* 108* 140*  BUN 41* 41* 44* 57*  CREATININE 2.78* 2.60* 2.51* 2.72*  CALCIUM 9.0 7.9* 8.2* 8.2*    CBC: Recent Labs  Lab 07/29/19 1149 07/29/19 1149 07/31/19 0914 08/01/19 1244 08/02/19 0357 08/02/19 1856 08/03/19 0344 08/03/19 0629  WBC 8.8   < > 18.2* 19.5* 24.8* 29.7* 35.5* 38.5*  NEUTROABS 5.1  --  10.3*  --  21.8*  --  28.4*  --   HGB 9.7*   < > 9.9* 7.5* 7.2* 8.1* 8.4* 8.4*  HCT 28.2*  --  29.3* 23.7* 22.8* 25.2* 26.3* 25.9*  MCV 84.4  --  87.2 90.5 91.6 90.3 90.4 90.2  PLT 20*   < > 14* 10* 34* 19* 57* 45*   < > =  values in this interval not displayed.    Coagulation Studies: Recent Labs    08/01/19 0500  LABPROT 20.3*  INR 1.7*    Imaging: CT HEAD WO CONTRAST  Result Date: 08/02/2019 CLINICAL DATA:  86 year old female with lethargy, decreased respiratory effort, delirium. EXAM: CT HEAD WITHOUT CONTRAST TECHNIQUE: Contiguous axial images were obtained from the base of the skull through the vertex without intravenous contrast. COMPARISON:  Head CT 10/24/2018 and earlier. FINDINGS: Brain: Streak artifact from right cochlear implant device again noted. Cerebral volume is stable since 2020 and within normal limits for age. No midline shift, ventriculomegaly, mass effect, evidence of mass lesion, intracranial hemorrhage or evidence of cortically based acute infarction. Questionable increased hypodensity in the left basal ganglia and internal capsule compared to 10/24/2018 (series 2, image 16) this is in the region of streak artifact. Elsewhere cerebral white matter hypodensity appears stable. Vascular: Calcified atherosclerosis at the skull base. No suspicious intracranial vascular hyperdensity. Skull: No acute osseous abnormality  identified. Sinuses/Orbits: Previous right mastoidectomy with improved right tympanic cavity and mastoid defect pneumatization since 2020. Other Visualized paranasal sinuses and mastoids are stable and well pneumatized. Other: Right side cochlear implant with generator device along the right posterior convexity and associated streak artifact. No acute orbit or scalp soft tissue finding. IMPRESSION: 1. Streak artifact related to chronic right cochlear implant with real versus artifactual new hypodensity in the left basal ganglia since 10/24/2018. If there is no right side weakness/deficits then artifact would be favored. 2. Otherwise stable noncontrast CT appearance of the brain. Electronically Signed   By: Genevie Ann M.D.   On: 08/02/2019 11:58   US RENAL  Result Date: 08/02/2019 CLINICAL DATA:  86 year old female with renal failure. EXAM: RENAL / URINARY TRACT ULTRASOUND COMPLETE COMPARISON:  Abdomen ultrasound 05/08/2013. FINDINGS: Right Kidney: Renal measurements: 9.5 x 3.9 x 4.7 centimeters = volume: 90 mL. No right hydronephrosis or solid renal mass. Small 2 centimeter right upper pole cyst was not apparent in 2014. the right renal cortex appears echogenic compared to the liver on image 5. Left Kidney: Renal measurements: 10.1 x 4.4 x 4.4 centimeters = volume: 101 mL. Echogenic left kidney similar to that on the right. No mass or hydronephrosis visualized. Bladder: Diminutive.  Appears normal for degree of bladder distention. Other: None. IMPRESSION: No acute renal findings. Evidence of some chronic medical renal disease. Electronically Signed   By: Genevie Ann M.D.   On: 08/02/2019 13:48   DG CHEST PORT 1 VIEW  Result Date: 08/02/2019 CLINICAL DATA:  Dyspnea EXAM: PORTABLE CHEST 1 VIEW COMPARISON:  07/31/2019 FINDINGS: Cardiomegaly. Right chest port catheter. Diffuse bilateral interstitial pulmonary opacity and new, small layering bilateral pleural effusions. The visualized skeletal structures are unremarkable.  IMPRESSION: 1. New, small bilateral pleural effusions. 2. Diffuse bilateral interstitial pulmonary opacity, most consistent with pulmonary edema. 3. Cardiomegaly. Electronically Signed   By: Eddie Candle M.D.   On: 08/02/2019 16:27     I have reviewed the above imaging : head CT performed on 08/02/2019 unremarkable for acute findings   ASSESSMENT AND PLAN  86 y.o. female with known diagnosis of polymyositis, arthritis, hypertension, myelodysplastic syndrome admitted to [facility] long hospital due to sepsis.  Patient's mental status has progressively declined and neurology was consulted.     Acute metabolic encephalopathy likely multifactorial  2/2 1. IV Dilaudid 2. Steroids 3. Cefepime induced encephalopathy 4. Sepsis, C.diff still needs to be ruled out with leukocytosis increasing to 35.5 from 29.7 5. Delirium 6. Uremia  and acute kidney injury  ( BUN now 57)    Recommendations Check Ammonia level, VPA level   We will obtain stat EEG to rule out nonconvulsive status although low suspicion Stop IV Dilaudid/pain medications as patient is obtunded Agree with decreasing steroids, consider repeating CK in 1 to 2 days to see if it trends upwards on decreasing dose, currently on 81.  Recommend infectious disease consult as patient's leukocytosis appears to be getting worse and no clear source identified yet Low suspicion for primary CNS infection as I do not appreciate nuchal rigidity, however if recommended by ID can obtain LP under fluroscopy. Please hold enoxaparin. MRI brain when stable Close monitoring for airway, however it appears patient is DNR   Karena Addison Aroor [facility] Pager Number [phone]

## 2019-08-03 NOTE — Progress Notes (Signed)
Pt repeatedly yelling out and jerking her right arm. PRN pain medication attempted several times with minimal relief. RR RN called to assess pt due to continued confusion. NP on call notified of no documented urine output since this morning. Bladder scan shows 209. Order for in and out cath placed with 325 mL urine returned. PRN pain medication adjusted, see orders. Pt readjusted in the bed several times throughout the night and hearing aid put in place to try and redirect. Attempts unsuccessful. Will continue with redirection and readjustment techniques.

## 2019-08-03 NOTE — Progress Notes (Signed)
Pt bladder scanned again this afternoon. 120 cc's found in bladder. MD is aware of pt's decreased urine output. Will continue to monitor.

## 2019-08-03 NOTE — Progress Notes (Signed)
Pharmacy Antibiotic Note  Heather Olson is a 86 y.o. female admitted on 07/31/2019 with sepsis.  Pharmacy has been consulted for vancomycin & cefepime dosing. CXR negative. Phlebotomy unable to get blood cultures in the ED.  UA: rare bacteria, 11-20 non-squamous epithelial cells.  WBC 18.2, SCr 2.78, T max 102.9. Recently placed on Augmentin for URI by Oncology.   Plan: Cefepime 2 gm IV q24 Vanc 1 gm given in ED, then additional 750 mg for 1750 mg loading dose Vancomycin 500 mg IV Q 36 hrs,  AUC: 500, SCr 2.78, Vd 0.5 w/ BMI 33  Afeb, WBC increased - likely due to high dose steroids Hx of Polymositis on Prednisone 5mg  daily PTA incr to 40mg  daily (not charted) then changed to Dexamethasone 6mg  IV bid, ~ equivalent to 80mg  Prednisone daily (query to MD what is desired steroid dose)  Height: 5\' 4"  (162.6 cm) Weight: 193 lb 9 oz (87.8 kg) IBW/kg (Calculated) : 54.7  Temp (24hrs), Avg:97.8 F (36.6 C), Min:97.6 F (36.4 C), Max:98 F (36.7 C)  Recent Labs  Lab 07/31/19 0914 07/31/19 0923 08/01/19 1244 08/02/19 0357 08/02/19 1856 08/03/19 0344 08/03/19 0629  WBC 18.2*  --  19.5* 24.8* 29.7* 35.5* 38.5*  CREATININE 2.78*  --  2.60* 2.51*  --  2.72*  --   LATICACIDVEN  --  1.6  --   --   --   --   --     Estimated Creatinine Clearance: 16.5 mL/min (A) (by C-G formula based on SCr of 2.72 mg/dL (H)).    Allergies  Allergen Reactions  . Butalbital-Apap-Caffeine Itching    Patient reports itching after taking x 1 tablet, so she stopped taking medication  . Imuran [Azathioprine] Other (See Comments)    Luekemia  . Lisinopril Cough    Antimicrobials this admission: 1/2 Flagyl x1 1/2 Cefepime> 1/2 Vanc>> Acyclovir as PTA 400 po bid>>  Dose adjustments this admission:  Microbiology results: 1/2 Covid: neg 1/2 UCx: ng-final 1/3 BCx: sent Cdiff: ordered-need collection, was on Augmentin PTA  Thank you for allowing pharmacy to be a part of this patient's care.  Minda Ditto PharmD 08/03/2019, 3:14 PM

## 2019-08-03 NOTE — Progress Notes (Signed)
Call received from [facility] for an update on patient's condition. RN given all of the desired information and all questions answered. Verbalized understanding. Per RN, [facility] still does not have a bed available for Heather Olson, but she is hopeful that one may become available tomorrow.

## 2019-08-03 NOTE — Progress Notes (Signed)
EEG complete - results pending 

## 2019-08-03 NOTE — Plan of Care (Signed)
  Problem: Education: Goal: Knowledge of General Education information will improve Description: Including pain rating scale, medication(s)/side effects and non-pharmacologic comfort measures Outcome: Not Progressing   Problem: Health Behavior/Discharge Planning: Goal: Ability to manage health-related needs will improve Outcome: Not Progressing   Problem: Clinical Measurements: Goal: Ability to maintain clinical measurements within normal limits will improve Outcome: Not Progressing Goal: Will remain free from infection Outcome: Progressing Goal: Diagnostic test results will improve Outcome: Progressing Goal: Respiratory complications will improve Outcome: Progressing Goal: Cardiovascular complication will be avoided Outcome: Progressing   Problem: Activity: Goal: Risk for activity intolerance will decrease Outcome: Not Progressing   Problem: Nutrition: Goal: Adequate nutrition will be maintained Outcome: Not Progressing   Problem: Coping: Goal: Level of anxiety will decrease Outcome: Not Progressing   Problem: Elimination: Goal: Will not experience complications related to bowel motility Outcome: Progressing Goal: Will not experience complications related to urinary retention Outcome: Not Progressing   Problem: Pain Managment: Goal: General experience of comfort will improve Outcome: Not Progressing   Problem: Safety: Goal: Ability to remain free from injury will improve Outcome: Not Progressing   Problem: Skin Integrity: Goal: Risk for impaired skin integrity will decrease Outcome: Progressing

## 2019-08-04 ENCOUNTER — Encounter (HOSPITAL_COMMUNITY): Payer: Self-pay | Admitting: Radiology

## 2019-08-04 ENCOUNTER — Telehealth: Payer: Self-pay

## 2019-08-04 ENCOUNTER — Inpatient Hospital Stay (HOSPITAL_COMMUNITY): Payer: Medicare Other

## 2019-08-04 LAB — COMPREHENSIVE METABOLIC PANEL
ALT: 22 U/L (ref 0–44)
AST: 32 U/L (ref 15–41)
Albumin: 2 g/dL — ABNORMAL LOW (ref 3.5–5.0)
Alkaline Phosphatase: 87 U/L (ref 38–126)
Anion gap: 13 (ref 5–15)
BUN: 84 mg/dL — ABNORMAL HIGH (ref 8–23)
CO2: 15 mmol/L — ABNORMAL LOW (ref 22–32)
Calcium: 8.2 mg/dL — ABNORMAL LOW (ref 8.9–10.3)
Chloride: 118 mmol/L — ABNORMAL HIGH (ref 98–111)
Creatinine, Ser: 3.61 mg/dL — ABNORMAL HIGH (ref 0.44–1.00)
GFR calc Af Amer: 13 mL/min — ABNORMAL LOW (ref >60)
GFR calc non Af Amer: 11 mL/min — ABNORMAL LOW (ref >60)
Glucose, Bld: 209 mg/dL — ABNORMAL HIGH (ref 70–99)
Potassium: 5.1 mmol/L (ref 3.5–5.1)
Sodium: 146 mmol/L — ABNORMAL HIGH (ref 135–145)
Total Bilirubin: 0.7 mg/dL (ref 0.3–1.2)
Total Protein: 5.7 g/dL — ABNORMAL LOW (ref 6.5–8.1)

## 2019-08-04 LAB — PREPARE PLATELET PHERESIS: Unit division: 0

## 2019-08-04 LAB — CBC WITH DIFFERENTIAL/PLATELET
Abs Immature Granulocytes: 0 10*3/uL (ref 0.00–0.07)
Band Neutrophils: 10 %
Basophils Absolute: 0 10*3/uL (ref 0.0–0.1)
Basophils Relative: 0 %
Blasts: 12 %
Eosinophils Absolute: 0 10*3/uL (ref 0.0–0.5)
Eosinophils Relative: 0 %
HCT: 23.5 % — ABNORMAL LOW (ref 36.0–46.0)
Hemoglobin: 7.8 g/dL — ABNORMAL LOW (ref 12.0–15.0)
Lymphocytes Relative: 5 %
Lymphs Abs: 2.3 10*3/uL (ref 0.7–4.0)
MCH: 29.8 pg (ref 26.0–34.0)
MCHC: 33.2 g/dL (ref 30.0–36.0)
MCV: 89.7 fL (ref 80.0–100.0)
Monocytes Absolute: 1.4 10*3/uL — ABNORMAL HIGH (ref 0.1–1.0)
Monocytes Relative: 3 %
Neutro Abs: 36.3 10*3/uL — ABNORMAL HIGH (ref 1.7–7.7)
Neutrophils Relative %: 70 %
Platelets: 29 10*3/uL — CL (ref 150–400)
RBC: 2.62 MIL/uL — ABNORMAL LOW (ref 3.87–5.11)
RDW: 18.2 % — ABNORMAL HIGH (ref 11.5–15.5)
WBC: 45.4 10*3/uL — ABNORMAL HIGH (ref 4.0–10.5)
nRBC: 0 % (ref 0.0–0.2)

## 2019-08-04 LAB — BPAM PLATELET PHERESIS
Blood Product Expiration Date: 202101062359
ISSUE DATE / TIME: 202101050046
Unit Type and Rh: 6200

## 2019-08-04 LAB — PREPARE RBC (CROSSMATCH)

## 2019-08-04 MED ORDER — LORAZEPAM 2 MG/ML IJ SOLN
1.0000 mg | INTRAMUSCULAR | Status: AC
  Administered 2019-08-04: 1 mg via INTRAVENOUS
  Filled 2019-08-04: qty 1

## 2019-08-04 MED ORDER — POLYVINYL ALCOHOL 1.4 % OP SOLN
1.0000 [drp] | OPHTHALMIC | Status: DC | PRN
  Administered 2019-08-04 – 2019-08-05 (×2): 1 [drp] via OPHTHALMIC
  Filled 2019-08-04: qty 15

## 2019-08-04 MED ORDER — SODIUM CHLORIDE 0.9% IV SOLUTION: Freq: Once | INTRAVENOUS | Status: AC

## 2019-08-04 MED ORDER — HYDROMORPHONE HCL 1 MG/ML IJ SOLN
1.0000 mg | INTRAMUSCULAR | Status: DC | PRN
  Administered 2019-08-05 (×2): 1 mg via INTRAVENOUS
  Filled 2019-08-04 (×2): qty 1

## 2019-08-04 NOTE — Progress Notes (Signed)
Pt has not voided since AM in&out catheterization. Bladder scan shows 84 cc in bladder. Dr. [place] paged and made aware.

## 2019-08-04 NOTE — Progress Notes (Signed)
Upon AM assessment, pt had x1 incontinent episode. No documented voids/urine output since yesterday morning around 0400. In and out cath performed, 200 cc's yellow urine returned. Dr. [place] made aware. Will continue to monitor.

## 2019-08-04 NOTE — Progress Notes (Signed)
CRITICAL VALUE ALERT  Critical Value: Platelets 29   Date & Time Notied:  08/04/19 @0640   Provider Notified: Tylene Fantasia   Orders Received/Actions taken: none

## 2019-08-04 NOTE — Progress Notes (Signed)
PROGRESS NOTE    Heather Olson  [id number] DOB: 12-23-1932 DOA: 07/31/2019 PCP: Johnston Ebbs, MD    Brief Narrative:   Heather Olson is a 86 y.o. female medical history significant ofMDS, myositis who presented with weakness for 3 days. Hx per son. He reports that Thursday he noted his mom was fatigued and lethargic. He took her to her oncologist, who was concerned for infection. Was given abx. He noticed over the next two days that she had increased weakness to the point that she couldn't walk or use her rollator. He noticed that she was confused. He noticed that she had frequent diarrhea. He became concerned because she had fevers and brought her to [facility].  She was found tobe septic. Started on vanc/cefepime. Found to have elevated troponin. Cardiology notified. HerOncologist teamwas notified   Assessment & Plan:   Active Problems:   MDS (myelodysplastic syndrome), high grade (HCC)   Sepsis due to undetermined organism (HCC)   Weakness   Diarrhea   AKI (acute kidney injury) ([place])   Elevated troponin   CKD (chronic kidney disease), stage III   Acute metabolic encephalopathy Patient presenting to ED with worsening confusion and weakness.  Seen by outpatient oncology and prescribed oral antibiotic; without significant improvement.  Initially thought patient was septic with leukocytosis and fever of 102.9 on admission; started on empiric antibiotics with vancomycin and cefepime which has subsequently been discontinued by ID on 08/03/2019.  Covid-19 test was negative.  Evaluated by neurology on 08/03/2019; etiology likely multifactorial with IV Dilaudid, steroids, cefepime induced encephalopathy, delirium, uremia from AKI versus worsening MDS.  Valproic acid level less than 10.  Ammonia level 44.  EEG for severe diffuse encephalopathy, nonspecific, no seizures or epileptiform discharges. --Continues to be afebrile, antibiotics now discontinued as of 08/03/2019 by ID --Repeat CK in the  morning to see if steroids can be discontinued for underlying polymyositis --Attempt MR brain --Supportive care  Leukocytosis Hx MDS Patient presenting from home with progressive fatigue, weakness and decline.  Initially had fever of 102.9 with elevated white blood cell count.  Patient had been on IV decitabine; but recently transitioned to oral.  Follows with [facility], Dr. Leretha Pol.  Was treated with outpatient antibiotics without improvement.  Was initially started on empiric antibiotics with vancomycin and cefepime.  Procalcitonin was elevated at 18.80, although this in the setting of history of underlying MDS.  Also 1.6, within normal limits.  Was evaluated by infectious disease, Dr. Linus Salmons on 08/03/2019.  Differential includes infectious etiology, reactive leukocytosis from steroids, versus progression of her underlying MDS.  Antibiotics were discontinued by infectious disease on 08/03/2019. --Peripheral blood smear with leukocytosis and increased blasts; which is concerning for progressive MDS --WBC 38.5-->45.4 --Oncology following, appreciate assistance --Awaiting transfer to [facility] for further evaluation  Generalized weakness Polymyositis --Continue dexamethasone 6 mg IV daily --Repeat CK level in the a.m.  Thrombocytopenia Status post transfusion platelets on 1/3 and 08/02/2019.  No signs of active bleeding. --Platelets down to 29K today --Transfuse for platelet count less than 20K or active bleeding per oncology --monitor CBC daily  Anemia Status post 1 unit PRBC on 08/02/2019. --Hgb 7.8 today; will transfuse 1 unit PRBC as oncology recommends maintain hemoglobin greater than 8.0 --Follow CBC daily  Elevated troponin Etiology likely secondary to demand ischemia.  TTE with normal EF and grade 1 diastolic dysfunction.  Was initially seen by cardiology during the hospitalization, now signed off.   DVT prophylaxis: SCDs, chemical DVT prophylaxis contraindicated  with  thrombocytopenia Code Status: DNR Family Communication: Discussed plan of care with patient's son present at bedside Disposition Plan: Continue inpatient, pending transfer to [facility]   Consultants:   Neurology  Infectious disease  Medical oncology  Procedures:   EEG  Antimicrobials:   Vancomycin 1/2 - 1/5  Cefepime 1/2 - 1/5  Acyclovir 1/2>>   Subjective: Patient seen and examined at bedside, moaning.  Otherwise unable to communicate.  Son present at bedside, he feels that his mother is slightly improved today in terms of her "restlessness".  No other questions or concerns at this time other than patient's son wants her to be comfortable.  Unable to obtain any further ROS from patient given her current mental status.  No acute concerns overnight per nursing staff.  Objective: Vitals:   08/03/19 1223 08/03/19 2109 08/04/19 0500 08/04/19 1250  BP: (!) 139/48 125/69 (!) 109/55 (!) 104/48  Pulse: (!) 101 90 91 78  Resp: 16 18 20 15   Temp: 98 F (36.7 C) 98.7 F (37.1 C) 98.8 F (37.1 C) 98.6 F (37 C)  TempSrc: Oral Oral    SpO2: 95% 96% 100% 98%  Weight:      Height:        Intake/Output Summary (Last 24 hours) at 08/04/2019 1546 Last data filed at 08/04/2019 1400 Gross per 24 hour  Intake 2484.22 ml  Output 200 ml  Net 2284.22 ml   Filed Weights   07/31/19 0919  Weight: 87.8 kg    Examination:  General exam: No acute distress, moaning Respiratory system: Breath sounds slightly decreased bilateral bases, no wheezing/crackles, normal respiratory effort, on 2 L nasal cannula oxygen 100% Cardiovascular system: S1 & S2 heard, RRR. No JVD, murmurs, rubs, gallops or clicks. No pedal edema. Gastrointestinal system: Abdomen is nondistended, soft and nontender. No organomegaly or masses felt. Normal bowel sounds heard. Central nervous system: Alert, confused, intermittent agitation with moaning Extremities: Symmetric 5 x 5 power. Skin: No rashes, lesions  or ulcers Psychiatry: He is with intermittent agitation, moaning    Data Reviewed: I have personally reviewed following labs and imaging studies  CBC: Recent Labs  Lab 07/29/19 1149 07/29/19 1149 07/31/19 0914 08/02/19 0357 08/02/19 1856 08/03/19 0344 08/03/19 0629 08/04/19 0325  WBC 8.8   < > 18.2* 24.8* 29.7* 35.5* 38.5* 45.4*  NEUTROABS 5.1  --  10.3* 21.8*  --  28.4*  --  36.3*  HGB 9.7*   < > 9.9* 7.2* 8.1* 8.4* 8.4* 7.8*  HCT 28.2*  --  29.3* 22.8* 25.2* 26.3* 25.9* 23.5*  MCV 84.4  --  87.2 91.6 90.3 90.4 90.2 89.7  PLT 20*   < > 14* 34* 19* 57* 45* 29*   < > = values in this interval not displayed.   Basic Metabolic Panel: Recent Labs  Lab 08/01/19 1244 08/02/19 0357 08/03/19 0344 08/03/19 1953 08/04/19 0325  NA 138 143 145 148* 146*  K 4.4 4.1 5.4* 5.3* 5.1  CL 110 117* 119* 121* 118*  CO2 18* 16* 15* 14* 15*  GLUCOSE 145* 108* 140* 163* 209*  BUN 41* 44* 57* 78* 84*  CREATININE 2.60* 2.51* 2.72* 3.39* 3.61*  CALCIUM 7.9* 8.2* 8.2* 8.2* 8.2*   GFR: Estimated Creatinine Clearance: 12.4 mL/min (A) (by C-G formula based on SCr of 3.61 mg/dL (H)). Liver Function Tests: Recent Labs  Lab 07/31/19 0914 08/02/19 0357 08/03/19 0344 08/04/19 0325  AST 35 36 34 32  ALT 24 20 21 22   ALKPHOS  71 73 84 87  BILITOT 0.8 0.7 1.0 0.7  PROT 6.5 5.6* 5.8* 5.7*  ALBUMIN 2.6* 2.1* 2.1* 2.0*   Recent Labs  Lab 07/31/19 0936  LIPASE 27   Recent Labs  Lab 08/03/19 2028  AMMONIA 44*   Coagulation Profile: Recent Labs  Lab 07/31/19 1000 08/01/19 0500  INR 1.6* 1.7*   Cardiac Enzymes: Recent Labs  Lab 08/01/19 1306  CKTOTAL 81   BNP (last 3 results) No results for input(s): PROBNP in the last 8760 hours. HbA1C: No results for input(s): HGBA1C in the last 72 hours. CBG: Recent Labs  Lab 07/31/19 1010 08/01/19 0413 08/02/19 1028 08/03/19 0102  GLUCAP 81 96 88 117*   Lipid Profile: No results for input(s): CHOL, HDL, LDLCALC, TRIG, CHOLHDL,  LDLDIRECT in the last 72 hours. Thyroid Function Tests: No results for input(s): TSH, T4TOTAL, FREET4, T3FREE, THYROIDAB in the last 72 hours. Anemia Panel: Recent Labs    08/03/19 1953  VITAMINB12 1,386*   Sepsis Labs: Recent Labs  Lab 07/31/19 0923 08/01/19 0500  PROCALCITON  --  18.80  LATICACIDVEN 1.6  --     Recent Results (from the past 240 hour(s))  Urine culture     Status: None   Collection Time: 07/31/19  2:36 PM   Specimen: In/Out Cath Urine  Result Value Ref Range Status   Specimen Description   Final    IN/OUT CATH URINE Performed at [facility], [place] [address], [place], [place] [address]    Special Requests   Final    NONE Performed at [facility], [place] [address] [place], [place] [address]    Culture   Final    NO GROWTH Performed at [facility], [place] [address] [place], [place] [address]    Report Status 08/01/2019 FINAL  Final  SARS CORONAVIRUS 2 (TAT 6-24 HRS) Nasopharyngeal Nasopharyngeal Swab     Status: None   Collection Time: 07/31/19 10:25 PM   Specimen: Nasopharyngeal Swab  Result Value Ref Range Status   SARS Coronavirus 2 NEGATIVE NEGATIVE Final    Comment: (NOTE) SARS-CoV-2 target nucleic acids are NOT DETECTED. The SARS-CoV-2 RNA is generally detectable in upper and lower respiratory specimens during the acute phase of infection. Negative results do not preclude SARS-CoV-2 infection, do not rule out co-infections with other pathogens, and should not be used as the sole basis for treatment or other patient management decisions. Negative results must be combined with clinical observations, patient history, and epidemiological information. The expected result is Negative. Fact Sheet for Patients: SugarRoll.be Fact Sheet for Healthcare Providers: https://www.woods-mathews.com/ This test is not yet approved or cleared by the [place] FDA and  has  been authorized for detection and/or diagnosis of SARS-CoV-2 by FDA under an Emergency Use Authorization (EUA). This EUA will remain  in effect (meaning this test can be used) for the duration of the COVID-19 declaration under Section 56 4(b)(1) of the Act, 21 U.S.C. section 360bbb-3(b)(1), unless the authorization is terminated or revoked sooner. Performed at [facility], [place] [address], [place], [place] [address]   Blood Culture (routine x 2)     Status: None (Preliminary result)   Collection Time: 08/01/19  6:08 AM   Specimen: BLOOD  Result Value Ref Range Status   Specimen Description   Final    BLOOD LEFT ANTECUBITAL Performed at [place] [address], [place], [place] [address]    Special Requests   Final  BOTTLES DRAWN AEROBIC ONLY Blood Culture adequate volume Performed at [place] [address], [place], [place] [address]    Culture   Final    NO GROWTH 3 DAYS Performed at [facility], [place] [address], [place], [place] [address]    Report Status PENDING  Incomplete  Blood Culture (routine x 2)     Status: None (Preliminary result)   Collection Time: 08/01/19  6:08 AM   Specimen: BLOOD  Result Value Ref Range Status   Specimen Description   Final    BLOOD BLOOD LEFT HAND Performed at [place] [address], [place], [place] [address]    Special Requests   Final    BOTTLES DRAWN AEROBIC ONLY Blood Culture adequate volume Performed at [facility] [address], [place], [place] [address]    Culture   Final    NO GROWTH 3 DAYS Performed at [facility], [place] [address] [place], [place] [address]    Report Status PENDING  Incomplete         Radiology Studies: DG CHEST PORT 1 VIEW  Result Date: 08/02/2019 CLINICAL DATA:  Dyspnea EXAM: PORTABLE CHEST 1 VIEW COMPARISON:  07/31/2019 FINDINGS: Cardiomegaly. Right chest port catheter. Diffuse  bilateral interstitial pulmonary opacity and new, small layering bilateral pleural effusions. The visualized skeletal structures are unremarkable. IMPRESSION: 1. New, small bilateral pleural effusions. 2. Diffuse bilateral interstitial pulmonary opacity, most consistent with pulmonary edema. 3. Cardiomegaly. Electronically Signed   By: Eddie Candle M.D.   On: 08/02/2019 16:27   EEG adult  Result Date: 08/03/2019 Lora Havens, MD     08/03/2019  6:12 PM Patient Name: Heather Olson MRN: [id number] Epilepsy Attending: Lora Havens Referring Physician/Provider: Dr Remi Haggard A[place]or Date: 08/03/2019 Duration: 24.53 mins Patient history: 86yo f with ams.eeg to evaluate for seizure Level of alertness: awake AEDs during EEG study: None Technical aspects: This EEG study was done with scalp electrodes positioned according to the 10-20 International system of electrode placement. Electrical activity was acquired at a sampling rate of 500Hz  and reviewed with a high frequency filter of 70Hz  and a low frequency filter of 1Hz . EEG data were recorded continuously and digitally stored. DESCRIPTION: No clear posterior dominant rhythm was seen. EEG showed continuous generalized 3-5hz  theta-delta slowing. Triphasic waves at 0.5-1hz , generalized, maximal bifrontal were also noted. Hyperventilation and photic stimulation were not performed. ABNORMALITY - Continuous slow, generalized - Triphasic waves, generalized IMPRESSION: This study is suggestive of severe diffuse encephalopathy, non specific to etiology but could be secondary to toxic-metabolic causes, cefepime use. No seizures or definite epileptiform discharges were seen throughout the recording. Priyanka Barbra Sarks        Scheduled Meds: . sodium chloride   Intravenous Once  . acyclovir  400 mg Oral BID  . ascorbic acid  500 mg Oral Daily  . chlorhexidine  15 mL Mouth Rinse BID  . Chlorhexidine Gluconate Cloth  6 each Topical Daily  . dexamethasone (DECADRON)  injection  6 mg Intravenous Q24H  . docusate sodium  100 mg Oral BID  . lidocaine  2 patch Transdermal Q24H  . mouth rinse  15 mL Mouth Rinse q12n4p  . multivitamin with minerals  1 tablet Oral Daily  . pantoprazole  40 mg Oral Daily  . sodium chloride flush  10-40 mL Intracatheter Q12H   Continuous Infusions: .  sodium bicarbonate  infusion 1000 mL 125 mL/hr at 08/04/19 1304  LOS: 4 days    Time spent: 39 minutes spent on chart review, discussion with nursing staff, consultants, updating family and interview/physical exam; more than 50% of that time was spent in counseling and/or coordination of care.    Tnia Anglada J [place] DO [facility] 08/04/2019, 3:46 PM

## 2019-08-04 NOTE — Progress Notes (Addendum)
HEMATOLOGY-ONCOLOGY PROGRESS NOTE  SUBJECTIVE: [name] continues to moan mentally.  Eyes are open but she does not respond to me.  No bruising or bleeding noted.  Oncology History  MDS (myelodysplastic syndrome), high grade ([place])  11/13/2018 Bone Marrow Biopsy   1. BM biopsy & aspirate 11/13/18: hypercellular marrow (40-50%) with markedly left shifted erythroid predominance and dysplasia. 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 Negative for increased blasts. Associated flow cytometry demonstrated no discrete population of atypical blasts. The findings were consistent with myelodysplastic syndrome with multi-lineage dysplasia (MDS-MLD). While the aspirate smear and IHC staining showed a marked increase in early erythroid precursors, the strict criteria for acute erythroleukemia were NOT met. However, close clinical follow up is required as further disease progression may occur. MDS FISH panel showed 35.5% of cells showed monosomy 5 by interphase FISH; 2-3.5% showed signal patterns consistent with additional copies of chromosomes 7, 8, and 20. The significance, if any, of this low level cell population is uncertain.    02/06/2019 -  Chemotherapy   Monhtly Decitibine as inpatient for 5 days starting on 02/06/19 at [facility]. Plan for 4-6 months.     02/18/2019 Initial Diagnosis   MDS (myelodysplastic syndrome), high grade ([place])      REVIEW OF SYSTEMS:   Unable to obtain secondary to patient condition.  I have reviewed the past medical history, past surgical history, social history and family history with the patient and they are unchanged from previous note.   PHYSICAL EXAMINATION: ECOG PERFORMANCE STATUS: 3 - Symptomatic, >50% confined to bed  Vitals:   08/03/19 2109 08/04/19 0500  BP: 125/69 (!) 109/55  Pulse: 90 91  Resp: 18 20  Temp: 98.7 F (37.1 C) 98.8 F (37.1 C)  SpO2: 96% 100%   Filed Weights   07/31/19 0919  Weight: 193 lb 9 oz (87.8 kg)    Intake/Output  from previous day: 01/05 0701 - 01/06 0700 In: 1520.5 [I.V.:1420.3; IV Piggyback:100.1] Out: -   GENERAL: Alert, moans out on occasion, does not respond to me LUNGS: clear to auscultation and percussion with normal breathing effort HEART: regular rate & rhythm and no murmurs and no lower extremity edema ABDOMEN:abdomen soft, non-tender and normal bowel sounds Musculoskeletal:no cyanosis of digits and no clubbing  NEURO: Alert and does not respond to my voice  LABORATORY DATA:  I have reviewed the data as listed CMP Latest Ref Rng & Units 08/04/2019 08/03/2019 08/03/2019  Glucose 70 - 99 mg/dL 209(H) 163(H) 140(H)  BUN 8 - 23 mg/dL 84(H) 78(H) 57(H)  Creatinine 0.44 - 1.00 mg/dL 3.61(H) 3.39(H) 2.72(H)  Sodium 135 - 145 mmol/L 146(H) 148(H) 145  Potassium 3.5 - 5.1 mmol/L 5.1 5.3(H) 5.4(H)  Chloride 98 - 111 mmol/L 118(H) 121(H) 119(H)  CO2 22 - 32 mmol/L 15(L) 14(L) 15(L)  Calcium 8.9 - 10.3 mg/dL 8.2(L) 8.2(L) 8.2(L)  Total Protein 6.5 - 8.1 g/dL 5.7(L) - 5.8(L)  Total Bilirubin 0.3 - 1.2 mg/dL 0.7 - 1.0  Alkaline Phos 38 - 126 U/L 87 - 84  AST 15 - 41 U/L 32 - 34  ALT 0 - 44 U/L 22 - 21    Lab Results  Component Value Date   WBC 45.4 (H) 08/04/2019   HGB 7.8 (L) 08/04/2019   HCT 23.5 (L) 08/04/2019   MCV 89.7 08/04/2019   PLT 29 (LL) 08/04/2019   NEUTROABS 36.3 (H) 08/04/2019    DG Chest 2 View  Result Date: 07/29/2019 CLINICAL DATA:  Cough for 1 week  X EXAM: CHEST - 2 VIEW COMPARISON:  05/29/2019 FINDINGS: Right-sided chest port remains in place with distal tip terminating at the superior cavoatrial junction. The the heart size and mediastinal contours are within normal limits. Both lungs are clear. The visualized skeletal structures are unremarkable. IMPRESSION: No active cardiopulmonary disease. Electronically Signed   By: Davina Poke D.O.   On: 07/29/2019 13:54   CT HEAD WO CONTRAST  Result Date: 08/02/2019 CLINICAL DATA:  86 year old female with lethargy, decreased  respiratory effort, delirium. EXAM: CT HEAD WITHOUT CONTRAST TECHNIQUE: Contiguous axial images were obtained from the base of the skull through the vertex without intravenous contrast. COMPARISON:  Head CT 10/24/2018 and earlier. FINDINGS: Brain: Streak artifact from right cochlear implant device again noted. Cerebral volume is stable since 2020 and within normal limits for age. No midline shift, ventriculomegaly, mass effect, evidence of mass lesion, intracranial hemorrhage or evidence of cortically based acute infarction. Questionable increased hypodensity in the left basal ganglia and internal capsule compared to 10/24/2018 (series 2, image 16) this is in the region of streak artifact. Elsewhere cerebral white matter hypodensity appears stable. Vascular: Calcified atherosclerosis at the skull base. No suspicious intracranial vascular hyperdensity. Skull: No acute osseous abnormality identified. Sinuses/Orbits: Previous right mastoidectomy with improved right tympanic cavity and mastoid defect pneumatization since 2020. Other Visualized paranasal sinuses and mastoids are stable and well pneumatized. Other: Right side cochlear implant with generator device along the right posterior convexity and associated streak artifact. No acute orbit or scalp soft tissue finding. IMPRESSION: 1. Streak artifact related to chronic right cochlear implant with real versus artifactual new hypodensity in the left basal ganglia since 10/24/2018. If there is no right side weakness/deficits then artifact would be favored. 2. Otherwise stable noncontrast CT appearance of the brain. Electronically Signed   By: Genevie Ann M.D.   On: 08/02/2019 11:58   US RENAL  Result Date: 08/02/2019 CLINICAL DATA:  86 year old female with renal failure. EXAM: RENAL / URINARY TRACT ULTRASOUND COMPLETE COMPARISON:  Abdomen ultrasound 05/08/2013. FINDINGS: Right Kidney: Renal measurements: 9.5 x 3.9 x 4.7 centimeters = volume: 90 mL. No right  hydronephrosis or solid renal mass. Small 2 centimeter right upper pole cyst was not apparent in 2014. the right renal cortex appears echogenic compared to the liver on image 5. Left Kidney: Renal measurements: 10.1 x 4.4 x 4.4 centimeters = volume: 101 mL. Echogenic left kidney similar to that on the right. No mass or hydronephrosis visualized. Bladder: Diminutive.  Appears normal for degree of bladder distention. Other: None. IMPRESSION: No acute renal findings. Evidence of some chronic medical renal disease. Electronically Signed   By: Genevie Ann M.D.   On: 08/02/2019 13:48   DG CHEST PORT 1 VIEW  Result Date: 08/02/2019 CLINICAL DATA:  Dyspnea EXAM: PORTABLE CHEST 1 VIEW COMPARISON:  07/31/2019 FINDINGS: Cardiomegaly. Right chest port catheter. Diffuse bilateral interstitial pulmonary opacity and new, small layering bilateral pleural effusions. The visualized skeletal structures are unremarkable. IMPRESSION: 1. New, small bilateral pleural effusions. 2. Diffuse bilateral interstitial pulmonary opacity, most consistent with pulmonary edema. 3. Cardiomegaly. Electronically Signed   By: Eddie Candle M.D.   On: 08/02/2019 16:27   DG Chest Portable 1 View  Result Date: 07/31/2019 CLINICAL DATA:  Altered mental status and weakness EXAM: PORTABLE CHEST 1 VIEW COMPARISON:  07/29/2019; 05/29/2019 FINDINGS: Grossly unchanged cardiac silhouette and mediastinal contours. Stable positioning of support apparatus. No focal airspace opacities. No pleural effusion or pneumothorax. No evidence of edema. No acute osseous abnormalities.  IMPRESSION: No acute cardiopulmonary disease. Electronically Signed   By: Sandi Mariscal M.D.   On: 07/31/2019 10:38   EEG adult  Result Date: 08/03/2019 Lora Havens, MD     08/03/2019  6:12 PM Patient Name: Leonela Kivi MRN: [id number] Epilepsy Attending: Lora Havens Referring Physician/Provider: Dr Remi Haggard Aroor Date: 08/03/2019 Duration: 24.53 mins Patient history: 86yo f with ams.eeg  to evaluate for seizure Level of alertness: awake AEDs during EEG study: None Technical aspects: This EEG study was done with scalp electrodes positioned according to the 10-20 International system of electrode placement. Electrical activity was acquired at a sampling rate of '500Hz'$  and reviewed with a high frequency filter of '70Hz'$  and a low frequency filter of '1Hz'$ . EEG data were recorded continuously and digitally stored. DESCRIPTION: No clear posterior dominant rhythm was seen. EEG showed continuous generalized 3-'5hz'$  theta-delta slowing. Triphasic waves at 0.5-'1hz'$ , generalized, maximal bifrontal were also noted. Hyperventilation and photic stimulation were not performed. ABNORMALITY - Continuous slow, generalized - Triphasic waves, generalized IMPRESSION: This study is suggestive of severe diffuse encephalopathy, non specific to etiology but could be secondary to toxic-metabolic causes, cefepime use. No seizures or definite epileptiform discharges were seen throughout the recording. Lora Havens   ECHOCARDIOGRAM COMPLETE  Result Date: 08/01/2019   ECHOCARDIOGRAM REPORT   Patient Name:   ARACELIS ULREY Date of Exam: 08/01/2019 Medical Rec #:  [id number]     Height:       64.0 in Accession #:    [id number]    Weight:       193.6 lb Date of Birth:  1933/05/28    BSA:          1.93 m Patient Age:    89 years      BP:           137/51 mmHg Patient Gender: F             HR:           101 bpm. Exam Location:  Inpatient Procedure: 2D Echo, Cardiac Doppler and Color Doppler Indications:    Acute Cornonary Syndrome I24.9  History:        Patient has no prior history of Echocardiogram examinations.                 Risk Factors:Non-Smoker. Sepsis. Elevated troponin.  Sonographer:    Paulita Fujita RDCS Referring Phys: [id number] [facility] [place]  Sonographer Comments: Technically difficult study due to poor echo windows. IMPRESSIONS  1. Left ventricular ejection fraction, by visual estimation, is 70 to 75%. The left  ventricle has hyperdynamic function. There is mildly increased left ventricular hypertrophy. Turbulent LVOT flow without obvious gradient.  2. Left ventricular diastolic parameters are consistent with Grade I diastolic dysfunction (impaired relaxation).  3. The left ventricle has no regional wall motion abnormalities.  4. Global right ventricle has normal systolic function.The right ventricular size is normal. No increase in right ventricular wall thickness.  5. Left atrial size was normal.  6. Right atrial size was normal.  7. Mild mitral annular calcification.  8. The mitral valve is grossly normal. Trivial mitral valve regurgitation.  9. The tricuspid valve is grossly normal. 10. The aortic valve is tricuspid. Aortic valve regurgitation is not visualized. 11. The pulmonic valve was grossly normal. Pulmonic valve regurgitation is trivial. 12. The inferior vena cava is normal in size with greater than 50% respiratory variability, suggesting right atrial pressure of 3 mmHg. 13. TR signal is inadequate for assessing  pulmonary artery systolic pressure. FINDINGS  Left Ventricle: Left ventricular ejection fraction, by visual estimation, is 70 to 75%. The left ventricle has hyperdynamic function. The left ventricle has no regional wall motion abnormalities. The left ventricular internal cavity size was the left ventricle is normal in size. There is mildly increased left ventricular hypertrophy. Left ventricular diastolic parameters are consistent with Grade I diastolic dysfunction (impaired relaxation). Right Ventricle: The right ventricular size is normal. No increase in right ventricular wall thickness. Global RV systolic function is has normal systolic function. Left Atrium: Left atrial size was normal in size. Right Atrium: Right atrial size was normal in size Pericardium: There is no evidence of pericardial effusion. Mitral Valve: The mitral valve is grossly normal. Mild mitral annular calcification. Trivial mitral  valve regurgitation. Tricuspid Valve: The tricuspid valve is grossly normal. Tricuspid valve regurgitation is trivial. Aortic Valve: The aortic valve is tricuspid. Aortic valve regurgitation is not visualized. Mild aortic valve annular calcification. Pulmonic Valve: The pulmonic valve was grossly normal. Pulmonic valve regurgitation is trivial. Pulmonic regurgitation is trivial. Aorta: The aortic root is normal in size and structure. Venous: The inferior vena cava is normal in size with greater than 50% respiratory variability, suggesting right atrial pressure of 3 mmHg. IAS/Shunts: No atrial level shunt detected by color flow Doppler.  LEFT VENTRICLE PLAX 2D LVOT diam:     1.70 cm  Diastology LVOT Area:     2.27 cm LV e' lateral:   5.87 cm/s                         LV E/e' lateral: 17.2                         LV e' medial:    5.11 cm/s                         LV E/e' medial:  19.8  LEFT ATRIUM             Index LA Vol (A2C):   24.2 ml 12.54 ml/m LA Vol (A4C):   36.0 ml 18.66 ml/m LA Biplane Vol: 31.1 ml 16.12 ml/m  AORTIC VALVE LVOT Vmax:   105.00 cm/s LVOT Vmean:  79.900 cm/s LVOT VTI:    0.248 m MITRAL VALVE MV Area (PHT): 1.63 cm              SHUNTS MV PHT:        134.85 msec           Systemic VTI:  0.25 m MV Decel Time: 465 msec              Systemic Diam: 1.70 cm MV E velocity: 101.00 cm/s 103 cm/s MV A velocity: 160.00 cm/s 70.3 cm/s MV E/A ratio:  0.63        1.5  Rozann Lesches MD Electronically signed by Rozann Lesches MD Signature Date/Time: 08/01/2019/12:22:02 PM    Final     ASSESSMENT AND PLAN: 1.  MDS with pancytopenia, R-IPSS 4.0, intermediate risk 2.  Sepsis 3.  Diarrhea 4.  AKI on CKD 5.  Thrombocytopenia 6.  Anemia 7.  Polymyositis 8. Metabolic encephalopathy   -The patient has been on oral decitabine for treatment of her MDS.  She has had a declining performance status since starting this oral medication and the patient and her son want to switch back to IV decitabine.  We  have reached out to Dr. Leretha Pol at [facility] regarding this. -Platelet count 29,000 today.  No active bleeding noted.  Recommend platelet transfusion for platelet count less than 20,000 or active bleeding. -Hemoglobin is 7.8 today.  Recommend PRBC transfusion for hemoglobin less than 8. -Continue prednisone and morphine for polymyositis -Neurology is following for acute metabolic encephalopathy recommends MRI of the brain when stable   LOS: 4 days   Mikey Bussing, DNP, AGPCNP-BC, AOCNP 08/04/19  Addendum  I have seen the patient, examined her. I agree with the assessment and and plan and have edited the notes.   Pt remains to be mentally altered, does not respond or follow commands, moans intermittently. Son at bed side when I saw her. I have asked our team pathologist Dr. Tresa Moore to review her peripheral smear from this morning, which showed 14 to 15% blasts.  She has a worsening leukocytosis, with proximal dominant neutrophil, her increased neutrophils is likely related to steroids.  I have high suspicion that she has evolved to acute leukemia based on her peripheral smear (although the diagnosis requires 20% blast in blood or marrow), and her acute encephalopathy is likely related to leukemia, given her negative ID work-up. Her renal failure is also getting worse.   I disused her case with Dr. Leretha Pol at the [facility], he feels it is reasonable to consider palliative care and hospice at this point.  I had a long conversation with her son at the bedside today, given the overall very poor prognosis, multiorgan failure, I do not think she will survive.  I think she will likely die in the next few days to a few weeks. I recommend comfort care and hospice at this point. Her son voiced good understanding and will consider it.   I spoke with Dr. [place] after I talked to son. He agrees with the plan, and will discuss with her son again tomorrow. If she remains to be hemodynamically stable, we can call  hospice service, and transfer her to [facility].  I will f/u tomorrow again.   Truitt Merle  08/04/2019

## 2019-08-04 NOTE — Progress Notes (Signed)
Pt reported to this nurse by central telemetry to have had bigeminy pvcs but in NSR now. MD made aware

## 2019-08-04 NOTE — Progress Notes (Signed)
PT Cancellation Note  Patient Details Name: Heather Olson MRN: [id number] DOB: 09/18/1932   Cancelled Treatment:    Reason Eval/Treat Not Completed: Patient not medically ready-currently unable to participate meaningfully with PT. Also,possible xfer to [employer]. Will hold PT for now.    Doreatha Massed, PT Acute Rehabilitation

## 2019-08-04 NOTE — Progress Notes (Signed)
[facility] for Infectious Disease   Reason for visit: Follow up on leukocytosis  Interval History: no change.  WBC increasing to 45, blasts present.  Patient does not respond.  No fever.    Physical Exam: Constitutional:  Vitals:   08/04/19 0500 08/04/19 1250  BP: (!) 109/55 (!) 104/48  Pulse: 91 78  Resp: 20 15  Temp: 98.8 F (37.1 C) 98.6 F (37 C)  SpO2: 100% 98%   patient appears in NAD Respiratory: Normal respiratory effort; CTA B Cardiovascular: RRR GI: soft, nt, nd Neuro: eyes open, no response and yells out periodically.    Review of Systems: Unable to be assessed due to mental status  Lab Results  Component Value Date   WBC 45.4 (H) 08/04/2019   HGB 7.8 (L) 08/04/2019   HCT 23.5 (L) 08/04/2019   MCV 89.7 08/04/2019   PLT 29 (LL) 08/04/2019    Lab Results  Component Value Date   CREATININE 3.61 (H) 08/04/2019   BUN 84 (H) 08/04/2019   NA 146 (H) 08/04/2019   K 5.1 08/04/2019   CL 118 (H) 08/04/2019   CO2 15 (L) 08/04/2019    Lab Results  Component Value Date   ALT 22 08/04/2019   AST 32 08/04/2019   ALKPHOS 87 08/04/2019     Microbiology: Recent Results (from the past 240 hour(s))  Urine culture     Status: None   Collection Time: 07/31/19  2:36 PM   Specimen: In/Out Cath Urine  Result Value Ref Range Status   Specimen Description   Final    IN/OUT CATH URINE Performed at [facility], [place] [address], [place], [place] [address]    Special Requests   Final    NONE Performed at [facility], [place] [address], [place], [place] [address]    Culture   Final    NO GROWTH Performed at [facility], [place] [address] [place], [place] [address]    Report Status 08/01/2019 FINAL  Final  SARS CORONAVIRUS 2 (TAT 6-24 HRS) Nasopharyngeal Nasopharyngeal Swab     Status: None   Collection Time: 07/31/19 10:25 PM   Specimen: Nasopharyngeal Swab  Result Value Ref Range Status   SARS Coronavirus 2  NEGATIVE NEGATIVE Final    Comment: (NOTE) SARS-CoV-2 target nucleic acids are NOT DETECTED. The SARS-CoV-2 RNA is generally detectable in upper and lower respiratory specimens during the acute phase of infection. Negative results do not preclude SARS-CoV-2 infection, do not rule out co-infections with other pathogens, and should not be used as the sole basis for treatment or other patient management decisions. Negative results must be combined with clinical observations, patient history, and epidemiological information. The expected result is Negative. Fact Sheet for Patients: SugarRoll.be Fact Sheet for Healthcare Providers: https://www.woods-mathews.com/ This test is not yet approved or cleared by the [place] FDA and  has been authorized for detection and/or diagnosis of SARS-CoV-2 by FDA under an Emergency Use Authorization (EUA). This EUA will remain  in effect (meaning this test can be used) for the duration of the COVID-19 declaration under Section 56 4(b)(1) of the Act, 21 U.S.C. section 360bbb-3(b)(1), unless the authorization is terminated or revoked sooner. Performed at [facility], [place] [address] [place], [place] [address]   Blood Culture (routine x 2)     Status: None (Preliminary result)   Collection Time: 08/01/19  6:08 AM   Specimen: BLOOD  Result Value Ref Range Status  Specimen Description   Final    BLOOD LEFT ANTECUBITAL Performed at [place] [address] [place], [place] [address]    Special Requests   Final    BOTTLES DRAWN AEROBIC ONLY Blood Culture adequate volume Performed at [place] [address] [place], [place] [address]    Culture   Final    NO GROWTH 3 DAYS Performed at [facility], [place] [address] [place], [place] [address]    Report Status PENDING  Incomplete  Blood Culture (routine x 2)     Status: None (Preliminary result)    Collection Time: 08/01/19  6:08 AM   Specimen: BLOOD  Result Value Ref Range Status   Specimen Description   Final    BLOOD BLOOD LEFT HAND Performed at [place] [address], [place], [place] [address]    Special Requests   Final    BOTTLES DRAWN AEROBIC ONLY Blood Culture adequate volume Performed at [place] [address], [place], [place] [address]    Culture   Final    NO GROWTH 3 DAYS Performed at [facility], [place] [address] [place], [place] [address]    Report Status PENDING  Incomplete    Impression/Plan:  1. Leukocytosis - elevated and more c/w MDS.  No signs of any infection.    2. Encephalopathy - I have stopped cefepime with possibility of cefepime induced encephalopathy, though etiology of AMS remains unclear.  No signs of infection.    Continue to observe off of antibiotics, I will sign off.

## 2019-08-04 NOTE — Progress Notes (Signed)
Pt is restless and agitated. Given dilaudid but is not calm enough for a good bladder scan reading. One reading of 128cc obtained. Other scans unsuccessful. Will wait to see if she will calm down for another try to scan bladder.

## 2019-08-04 NOTE — Telephone Encounter (Signed)
I spoke with Heather Olson at Dr. Fredderick Phenix office requesting He call Dr. Burr Medico on her cell phone, number provided.  Myles Rosenthal stated she would sent the message to the "team"

## 2019-08-05 DIAGNOSIS — C9500 Acute leukemia of unspecified cell type not having achieved remission: Secondary | ICD-10-CM | POA: Diagnosis present

## 2019-08-05 LAB — CBC WITH DIFFERENTIAL/PLATELET
Abs Immature Granulocytes: 0 10*3/uL (ref 0.00–0.07)
Band Neutrophils: 0 %
Basophils Absolute: 0 10*3/uL (ref 0.0–0.1)
Basophils Relative: 0 %
Blasts: 22 %
Eosinophils Absolute: 0 10*3/uL (ref 0.0–0.5)
Eosinophils Relative: 0 %
HCT: 26.3 % — ABNORMAL LOW (ref 36.0–46.0)
Hemoglobin: 8.9 g/dL — ABNORMAL LOW (ref 12.0–15.0)
Lymphocytes Relative: 5 %
Lymphs Abs: 1.9 10*3/uL (ref 0.7–4.0)
MCH: 29.1 pg (ref 26.0–34.0)
MCHC: 33.8 g/dL (ref 30.0–36.0)
MCV: 85.9 fL (ref 80.0–100.0)
Monocytes Absolute: 0.7 10*3/uL (ref 0.1–1.0)
Monocytes Relative: 2 %
Myelocytes: 0 %
Neutro Abs: 26.3 10*3/uL — ABNORMAL HIGH (ref 1.7–7.7)
Neutrophils Relative %: 71 %
Platelets: 12 10*3/uL — CL (ref 150–400)
RBC: 3.06 MIL/uL — ABNORMAL LOW (ref 3.87–5.11)
RDW: 17.7 % — ABNORMAL HIGH (ref 11.5–15.5)
WBC: 37 10*3/uL — ABNORMAL HIGH (ref 4.0–10.5)
nRBC: 0 % (ref 0.0–0.2)

## 2019-08-05 LAB — PREPARE PLATELET PHERESIS: Unit division: 0

## 2019-08-05 LAB — BPAM PLATELET PHERESIS
Blood Product Expiration Date: 202101072359
Unit Type and Rh: 7300

## 2019-08-05 LAB — COMPREHENSIVE METABOLIC PANEL
ALT: 21 U/L (ref 0–44)
AST: 27 U/L (ref 15–41)
Albumin: 2.1 g/dL — ABNORMAL LOW (ref 3.5–5.0)
Alkaline Phosphatase: 79 U/L (ref 38–126)
Anion gap: 14 (ref 5–15)
BUN: 100 mg/dL — ABNORMAL HIGH (ref 8–23)
CO2: 21 mmol/L — ABNORMAL LOW (ref 22–32)
Calcium: 8.1 mg/dL — ABNORMAL LOW (ref 8.9–10.3)
Chloride: 109 mmol/L (ref 98–111)
Creatinine, Ser: 3.99 mg/dL — ABNORMAL HIGH (ref 0.44–1.00)
GFR calc Af Amer: 11 mL/min — ABNORMAL LOW (ref >60)
GFR calc non Af Amer: 10 mL/min — ABNORMAL LOW (ref >60)
Glucose, Bld: 238 mg/dL — ABNORMAL HIGH (ref 70–99)
Potassium: 4.8 mmol/L (ref 3.5–5.1)
Sodium: 144 mmol/L (ref 135–145)
Total Bilirubin: 0.9 mg/dL (ref 0.3–1.2)
Total Protein: 5.5 g/dL — ABNORMAL LOW (ref 6.5–8.1)

## 2019-08-05 LAB — TYPE AND SCREEN
ABO/RH(D): A POS
Antibody Screen: NEGATIVE
Unit division: 0

## 2019-08-05 LAB — BPAM RBC
Blood Product Expiration Date: 202101262359
ISSUE DATE / TIME: 202101062139
Unit Type and Rh: 6200

## 2019-08-05 LAB — MAGNESIUM: Magnesium: 2.3 mg/dL (ref 1.7–2.4)

## 2019-08-05 LAB — AMMONIA: Ammonia: 35 umol/L (ref 9–35)

## 2019-08-05 LAB — CK: Total CK: 28 U/L — ABNORMAL LOW (ref 38–234)

## 2019-08-05 MED ORDER — FUROSEMIDE 10 MG/ML IJ SOLN
INTRAMUSCULAR | Status: AC
  Filled 2019-08-05: qty 2

## 2019-08-05 MED ORDER — HALOPERIDOL LACTATE 2 MG/ML PO CONC
0.5000 mg | ORAL | Status: DC | PRN
  Filled 2019-08-05: qty 0.3

## 2019-08-05 MED ORDER — HALOPERIDOL 0.5 MG PO TABS
0.5000 mg | ORAL_TABLET | ORAL | Status: DC | PRN
  Filled 2019-08-05: qty 1

## 2019-08-05 MED ORDER — SODIUM CHLORIDE 0.9 % IV SOLN: 1.0000 mg/h | INTRAVENOUS | 0 refills | Status: AC

## 2019-08-05 MED ORDER — POLYVINYL ALCOHOL 1.4 % OP SOLN: 1.0000 [drp] | Freq: Four times a day (QID) | OPHTHALMIC | Status: DC | PRN

## 2019-08-05 MED ORDER — HEPARIN SOD (PORK) LOCK FLUSH 100 UNIT/ML IV SOLN
500.0000 [IU] | INTRAVENOUS | Status: AC | PRN
  Administered 2019-08-05: 500 [IU]
  Filled 2019-08-05: qty 5

## 2019-08-05 MED ORDER — SODIUM CHLORIDE 0.9% IV SOLUTION: Freq: Once | INTRAVENOUS | Status: DC

## 2019-08-05 MED ORDER — BIOTENE DRY MOUTH MT LIQD: 15.0000 mL | OROMUCOSAL | Status: DC | PRN

## 2019-08-05 MED ORDER — ACETAMINOPHEN 650 MG RE SUPP: 650.0000 mg | Freq: Four times a day (QID) | RECTAL | Status: DC | PRN

## 2019-08-05 MED ORDER — HALOPERIDOL LACTATE 5 MG/ML IJ SOLN: 0.5000 mg | INTRAMUSCULAR | Status: DC | PRN

## 2019-08-05 MED ORDER — SODIUM CHLORIDE 0.9 % IV SOLN
1.0000 mg/h | INTRAVENOUS | Status: DC
  Administered 2019-08-05: 1 mg/h via INTRAVENOUS
  Filled 2019-08-05: qty 5

## 2019-08-05 MED ORDER — LORAZEPAM 2 MG/ML PO CONC: 1.0000 mg | ORAL | Status: DC | PRN

## 2019-08-05 MED ORDER — GLYCOPYRROLATE 1 MG PO TABS
1.0000 mg | ORAL_TABLET | ORAL | Status: DC | PRN
  Filled 2019-08-05: qty 1

## 2019-08-05 MED ORDER — HYDROMORPHONE BOLUS VIA INFUSION: 0.5000 mg | INTRAVENOUS | 0 refills | Status: AC | PRN

## 2019-08-05 MED ORDER — GLYCOPYRROLATE 0.2 MG/ML IJ SOLN
0.2000 mg | INTRAMUSCULAR | Status: DC | PRN
  Filled 2019-08-05: qty 1

## 2019-08-05 MED ORDER — LORAZEPAM 2 MG/ML IJ SOLN: 1.0000 mg | INTRAMUSCULAR | 0 refills | Status: AC | PRN

## 2019-08-05 MED ORDER — LORAZEPAM 1 MG PO TABS: 1.0000 mg | ORAL_TABLET | ORAL | Status: DC | PRN

## 2019-08-05 MED ORDER — ONDANSETRON HCL 4 MG/2ML IJ SOLN: 4.0000 mg | Freq: Four times a day (QID) | INTRAMUSCULAR | Status: DC | PRN

## 2019-08-05 MED ORDER — HALOPERIDOL LACTATE 5 MG/ML IJ SOLN: 0.5000 mg | INTRAMUSCULAR | 0 refills | Status: AC | PRN

## 2019-08-05 MED ORDER — FUROSEMIDE 10 MG/ML IJ SOLN
20.0000 mg | Freq: Once | INTRAMUSCULAR | Status: AC
  Administered 2019-08-05: 20 mg via INTRAVENOUS

## 2019-08-05 MED ORDER — LORAZEPAM 2 MG/ML IJ SOLN: 1.0000 mg | INTRAMUSCULAR | Status: DC | PRN

## 2019-08-05 MED ORDER — ONDANSETRON 4 MG PO TBDP: 4.0000 mg | ORAL_TABLET | Freq: Four times a day (QID) | ORAL | Status: DC | PRN

## 2019-08-05 MED ORDER — GLYCOPYRROLATE 0.2 MG/ML IJ SOLN: 0.2000 mg | INTRAMUSCULAR | 0 refills | Status: AC | PRN

## 2019-08-05 MED ORDER — ACETAMINOPHEN 325 MG PO TABS: 650.0000 mg | ORAL_TABLET | Freq: Four times a day (QID) | ORAL | Status: DC | PRN

## 2019-08-05 MED ORDER — HYDROMORPHONE BOLUS VIA INFUSION
0.5000 mg | INTRAVENOUS | Status: DC | PRN
  Administered 2019-08-05 (×3): 0.5 mg via INTRAVENOUS
  Filled 2019-08-05: qty 1

## 2019-08-05 NOTE — Progress Notes (Signed)
PT Cancellation Note  Patient Details Name: Natalea Krengel MRN: [id number] DOB: December 30, 1932   Cancelled Treatment:     Pt is now Comfort Care and plans to D/C to [facility].     Rica Koyanagi  PTA Acute  Rehabilitation Services Pager      [phone] Office      [phone]

## 2019-08-05 NOTE — TOC Progression Note (Signed)
Transition of Care [facility]) - Progression Note    Patient Details  Name: Heather Olson MRN: [id number] Date of Birth: April 04, 1933  Transition [facility]) CM/SW Contact  Tiye Huwe, Juliann Pulse, RN Phone Number: 08/05/2019, 1:38 PM  Clinical Narrative: Referral for residential hospice-spoke to son Willie-chose [facility] rep aware-awaiting if able to accept.      Expected Discharge Plan: Hospice Medical Facility Barriers to Discharge: Other (comment)(await rapid covid results)  Expected Discharge Plan and Services Expected Discharge Plan: [place]   Discharge Planning Services: CM Consult Post Acute Care Choice: [place] Living arrangements for the past 2 months: Single Family Home                 DME Arranged: N/A DME Agency: NA       HH Arranged: NA HH Agency: NA         Social Determinants of Health (SDOH) Interventions    Readmission Risk Interventions No flowsheet data found.

## 2019-08-05 NOTE — Progress Notes (Signed)
Patient is wheezing and coughing after blood administration. Paged Tylene Fantasia

## 2019-08-05 NOTE — Plan of Care (Signed)
  Problem: Education: Goal: Knowledge of General Education information will improve Description: Including pain rating scale, medication(s)/side effects and non-pharmacologic comfort measures Outcome: Completed/Met   Problem: Health Behavior/Discharge Planning: Goal: Ability to manage health-related needs will improve 08/05/2019 1635 by Annie Sable, RN Outcome: Adequate for Discharge 08/05/2019 0845 by Annie Sable, RN Outcome: Not Progressing   Problem: Clinical Measurements: Goal: Ability to maintain clinical measurements within normal limits will improve 08/05/2019 1635 by Annie Sable, RN Outcome: Adequate for Discharge 08/05/2019 0845 by Annie Sable, RN Outcome: Not Progressing Goal: Will remain free from infection Outcome: Adequate for Discharge Goal: Diagnostic test results will improve 08/05/2019 1635 by Annie Sable, RN Outcome: Adequate for Discharge 08/05/2019 0845 by Annie Sable, RN Outcome: Not Progressing Goal: Cardiovascular complication will be avoided Outcome: Completed/Met   Problem: Activity: Goal: Risk for activity intolerance will decrease 08/05/2019 1635 by Annie Sable, RN Outcome: Adequate for Discharge 08/05/2019 0845 by Annie Sable, RN Outcome: Not Progressing   Problem: Nutrition: Goal: Adequate nutrition will be maintained 08/05/2019 1635 by Annie Sable, RN Outcome: Adequate for Discharge 08/05/2019 0845 by Annie Sable, RN Outcome: Not Progressing   Problem: Coping: Goal: Level of anxiety will decrease Outcome: Completed/Met   Problem: Elimination: Goal: Will not experience complications related to bowel motility 08/05/2019 1635 by Annie Sable, RN Outcome: Adequate for Discharge 08/05/2019 0845 by Annie Sable, RN Outcome: Not Progressing Goal: Will not experience complications related to urinary retention 08/05/2019 1635 by Annie Sable, RN Outcome: Adequate for Discharge 08/05/2019 0845 by Annie Sable,  RN Outcome: Progressing   Problem: Pain Managment: Goal: General experience of comfort will improve 08/05/2019 1635 by Annie Sable, RN Outcome: Adequate for Discharge 08/05/2019 0845 by Annie Sable, RN Outcome: Not Progressing   Problem: Safety: Goal: Ability to remain free from injury will improve Outcome: Completed/Met   Problem: Skin Integrity: Goal: Risk for impaired skin integrity will decrease Outcome: Completed/Met

## 2019-08-05 NOTE — TOC Transition Note (Signed)
Transition of Care [facility]) - CM/SW Discharge Note   Patient Details  Name: Heather Olson MRN: [id number] Date of Birth: November 12, 1932  [facility]) CM/SW Contact:  Dessa Phi, RN Phone Number: 08/05/2019, 3:24 PM   Clinical Narrative:   D/c to [facility] accepted-Nurse call report [phone]. PTAR called.    Final next level of care: [place] Barriers to Discharge: No Barriers Identified   Patient Goals and CMS Choice Patient states their goals for this hospitalization and ongoing recovery are:: to get better CMS Medicare.gov Compare Post Acute Care list provided to:: Patient Represenative (must comment) Choice offered to / list presented to : Adult Children  Discharge Placement                       Discharge Plan and Services   Discharge Planning Services: CM Consult Post Acute Care Choice: [place]          DME Arranged: N/A DME Agency: NA       HH Arranged: NA HH Agency: NA        Social Determinants of Health (SDOH) Interventions     Readmission Risk Interventions No flowsheet data found.

## 2019-08-05 NOTE — Progress Notes (Signed)
Per Baltazar Najjar, administer NEB treatment.

## 2019-08-05 NOTE — Plan of Care (Signed)
  Problem: Education: Goal: Knowledge of General Education information will improve Description: Including pain rating scale, medication(s)/side effects and non-pharmacologic comfort measures Outcome: Completed/Met   Problem: Health Behavior/Discharge Planning: Goal: Ability to manage health-related needs will improve Outcome: Not Progressing   Problem: Clinical Measurements: Goal: Ability to maintain clinical measurements within normal limits will improve Outcome: Not Progressing Goal: Diagnostic test results will improve Outcome: Not Progressing Goal: Cardiovascular complication will be avoided Outcome: Completed/Met   Problem: Activity: Goal: Risk for activity intolerance will decrease Outcome: Not Progressing   Problem: Nutrition: Goal: Adequate nutrition will be maintained Outcome: Not Progressing   Problem: Coping: Goal: Level of anxiety will decrease Outcome: Completed/Met   Problem: Elimination: Goal: Will not experience complications related to bowel motility Outcome: Not Progressing Goal: Will not experience complications related to urinary retention Outcome: Progressing   Problem: Pain Managment: Goal: General experience of comfort will improve Outcome: Not Progressing   Problem: Safety: Goal: Ability to remain free from injury will improve Outcome: Completed/Met   Problem: Skin Integrity: Goal: Risk for impaired skin integrity will decrease Outcome: Completed/Met

## 2019-08-05 NOTE — TOC Transition Note (Signed)
Transition of Care [facility]) - CM/SW Discharge Note   Patient Details  Name: Heather Olson MRN: [id number] Date of Birth: [birth year]  Transition of Care [facility]) CM/SW Contact:  Dessa Phi, RN Phone Number: 08/05/2019, 3:57 PM   Clinical Narrative:Nsg to call PTAR.       Final next level of care: [place] Barriers to Discharge: No Barriers Identified   Patient Goals and CMS Choice Patient states their goals for this hospitalization and ongoing recovery are:: to get better CMS Medicare.gov Compare Post Acute Care list provided to:: Patient Represenative (must comment) Choice offered to / list presented to : Adult Children  Discharge Placement                       Discharge Plan and Services   Discharge Planning Services: CM Consult Post Acute Care Choice: [place]          DME Arranged: N/A DME Agency: NA       HH Arranged: NA HH Agency: NA        Social Determinants of Health (SDOH) Interventions     Readmission Risk Interventions No flowsheet data found.

## 2019-08-05 NOTE — Discharge Summary (Signed)
Physician Discharge Summary  Landi Neuville [id number] DOB: [birth year] DOA: 07/31/2019  PCP: Johnston Ebbs, MD  Admit date: 07/31/2019 Discharge date: 08/05/2019  Admitted From: Home Disposition: [facility]   Discharge Condition: Hospice CODE STATUS: DNR Diet recommendation: Comfort feeds if tolerates  History of present illness:  Naveah Neveu is a 86 y.o.femalemedical history significant ofMDS, myositis who presented with weakness for 3 days. Hx per son. He reports that Thursday he noted his mom was fatigued and lethargic. He took her to her oncologist, who was concerned for infection. Was given abx. He noticed over the next two days that she had increased weakness to the point that she couldn't walk or use her rollator. He noticed that she was confused. He noticed that she had frequent diarrhea. He became concerned because she had fevers and brought her to [facility].  She was found tobe septic. Started on vanc/cefepime. Found to have elevated troponin. Cardiology notified. HerOncologist teamwas notified  Hospital course:  Acute metabolic encephalopathy Patient presenting to ED with worsening confusion and weakness.  Seen by outpatient oncology and prescribed oral antibiotic; without significant improvement.  Initially thought patient was septic with leukocytosis and fever of 102.9 on admission; started on empiric antibiotics with vancomycin and cefepime which has subsequently been discontinued by ID on 08/03/2019.  Covid-19 test was negative. Evaluated by neurology on 08/03/2019; etiology likely multifactorial with IV Dilaudid, steroids, cefepime induced encephalopathy, delirium, uremia from AKI versus worsening MDS.  Valproic acid level less than 10.  Ammonia level 44.  EEG for severe diffuse encephalopathy, nonspecific, no seizures or epileptiform discharges. Continues to be afebrile, antibiotics now discontinued as of 08/03/2019 by ID.  MR brain nondiagnostic  secondary to cochlear implant distortion.  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  Case was discussed with oncology, Dr. Burr Medico and patient's primary oncologist at [facility], who did not have any further treatments to offer and recommended palliative/comfort measures.  Discussed with patient's son at bedside on 08/05/2019, he wishes his mother not to suffer any further and would like to transition care to comfort measures at this time.  Continue Dilaudid infusion with bolus as needed for pain control, shortness of breath.  Ativan IV, Haldol IV, glycopyrrolate as needed.  Discharging to [facility] for end-of-life care.  Leukocytosis Hx MDS with acute transformation to AML Patient presenting from home with progressive fatigue, weakness and decline.  Initially had fever of 102.9 with elevated white blood cell count.  Patient had been on IV decitabine; but recently transitioned to oral.  Follows with [place] oncology, Dr. Leretha Pol.  Was treated with outpatient antibiotics without improvement.  Was initially started on empiric antibiotics with vancomycin and cefepime.  Procalcitonin was elevated at 18.80, although this in the setting of history of underlying MDS.  Also 1.6, within normal limits.  Was evaluated by infectious disease, Dr. Linus Salmons on 08/03/2019.  Differential includes infectious etiology, reactive leukocytosis from steroids, versus progression of her underlying MDS.  Antibiotics were discontinued by infectious disease on 08/03/2019. 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  Now transitioning to comfort measures as above and discharging to residential hospice  Generalized weakness Polymyositis Discontinue IV steroids  Thrombocytopenia Status post transfusion platelets on 1/3 and  08/02/2019.  No signs of active bleeding.  Will discontinue any further lab draws  and infusions now converting to comfort measures.  Anemia Status post 1 unit PRBC on 08/02/2019 and 08/04/2019.  No further transfusions or lab draws secondary to comfort measures.  Elevated troponin Etiology likely secondary to demand ischemia.  TTE with normal EF and grade 1 diastolic dysfunction.  Was initially seen by cardiology during the hospitalization, now signed off.  Discharge Diagnoses:  Principal Problem:   Acute leukemia ([place]) Active Problems:   MDS (myelodysplastic syndrome), high grade (HCC)   Sepsis due to undetermined organism (HCC)   Weakness   Diarrhea   AKI (acute kidney injury) ([place])   Elevated troponin   CKD (chronic kidney disease), stage III    Discharge Instructions  Discharge Instructions    Diet - low sodium heart healthy   Complete by: As directed    Increase activity slowly   Complete by: As directed      Allergies as of 08/05/2019      Reactions   Butalbital-apap-caffeine Itching   Patient reports itching after taking x 1 tablet, so she stopped taking medication   Imuran [azathioprine] Other (See Comments)   Luekemia   Lisinopril Cough      Medication List    STOP taking these medications   acetaminophen 650 MG CR tablet Commonly known as: TYLENOL   acyclovir 200 MG capsule Commonly known as: ZOVIRAX   amoxicillin-clavulanate 875-125 MG tablet Commonly known as: AUGMENTIN   ascorbic acid 500 MG tablet Commonly known as: VITAMIN C   divalproex 250 MG DR tablet Commonly known as: DEPAKOTE   losartan 25 MG tablet Commonly known as: COZAAR   multivitamin with minerals Tabs tablet   omeprazole 40 MG capsule Commonly known as: PRILOSEC   predniSONE 5 MG tablet Commonly known as: DELTASONE   traMADol 50 MG tablet Commonly known as: ULTRAM     TAKE these medications   glycopyrrolate 0.2 MG/ML injection Commonly known as: ROBINUL Inject 1 mL (0.2  mg total) into the vein every 4 (four) hours as needed (excessive secretions).   haloperidol lactate 5 MG/ML injection Commonly known as: HALDOL Inject 0.1 mLs (0.5 mg total) into the vein every 4 (four) hours as needed (aggitation or delirium).   HYDROmorphone 2 mg/mL Soln Commonly known as: DILAUDID Inject 0.5 mg into the vein every 15 (fifteen) minutes as needed (uncontrolled pain, distress or if respiratory rate is greater than 25).   HYDROmorphone 50 mg in sodium chloride 0.9 % 95 mL Inject 1 mg/hr into the vein continuous.   LORazepam 2 MG/ML injection Commonly known as: ATIVAN Inject 0.5 mLs (1 mg total) into the vein every 4 (four) hours as needed for anxiety.       Allergies  Allergen Reactions  . Butalbital-Apap-Caffeine Itching    Patient reports itching after taking x 1 tablet, so she stopped taking medication  . Imuran [Azathioprine] Other (See Comments)    Luekemia  . Lisinopril Cough    Consultations:  Neurology  Infectious disease  Medical oncology   Procedures/Studies: DG Chest 2 View  Result Date: 07/29/2019 CLINICAL DATA:  Cough for 1 week X EXAM: CHEST - 2 VIEW COMPARISON:  05/29/2019 FINDINGS: Right-sided chest port remains in place with distal tip terminating at the superior cavoatrial junction. The the heart size and mediastinal contours are within normal limits. Both lungs are clear. The visualized skeletal structures are unremarkable. IMPRESSION: No active cardiopulmonary disease. Electronically Signed   By: Davina Poke D.O.   On: 07/29/2019 13:54   CT HEAD WO  CONTRAST  Result Date: 08/02/2019 CLINICAL DATA:  86 year old female with lethargy, decreased respiratory effort, delirium. EXAM: CT HEAD WITHOUT CONTRAST TECHNIQUE: Contiguous axial images were obtained from the base of the skull through the vertex without intravenous contrast. COMPARISON:  Head CT 10/24/2018 and earlier. FINDINGS: Brain: Streak artifact from right cochlear implant  device again noted. Cerebral volume is stable since 2020 and within normal limits for age. No midline shift, ventriculomegaly, mass effect, evidence of mass lesion, intracranial hemorrhage or evidence of cortically based acute infarction. Questionable increased hypodensity in the left basal ganglia and internal capsule compared to 10/24/2018 (series 2, image 16) this is in the region of streak artifact. Elsewhere cerebral white matter hypodensity appears stable. Vascular: Calcified atherosclerosis at the skull base. No suspicious intracranial vascular hyperdensity. Skull: No acute osseous abnormality identified. Sinuses/Orbits: Previous right mastoidectomy with improved right tympanic cavity and mastoid defect pneumatization since 2020. Other Visualized paranasal sinuses and mastoids are stable and well pneumatized. Other: Right side cochlear implant with generator device along the right posterior convexity and associated streak artifact. No acute orbit or scalp soft tissue finding. IMPRESSION: 1. Streak artifact related to chronic right cochlear implant with real versus artifactual new hypodensity in the left basal ganglia since 10/24/2018. If there is no right side weakness/deficits then artifact would be favored. 2. Otherwise stable noncontrast CT appearance of the brain. Electronically Signed   By: Genevie Ann M.D.   On: 08/02/2019 11:58   MR BRAIN WO CONTRAST  Result Date: 08/04/2019 CLINICAL DATA:  Encephalopathy. Additional history provided: Progressive fatigue, weakness and decline. History of MDS, myositis presenting with weakness for 3 days. EXAM: MRI HEAD WITHOUT CONTRAST TECHNIQUE: Multiplanar, multiecho pulse sequences of the brain and surrounding structures were obtained without intravenous contrast. COMPARISON:  Head CT 08/02/2019 FINDINGS: Brain: Marked susceptibility artifact arising from a right-sided cochlear implant obscures large portions of the intracranial contents on multiple sequences and  severely limits evaluation. The diffusion-weighted and axial FLAIR imaging is essentially nondiagnostic. This precludes adequate evaluation for acute infarct. No intracranial mass is identified. Apart from T2/FLAIR hyperintensity along the margins of the lateral ventricles, which is not uncommonly seen in patients of this age, no definite focal parenchymal signal abnormality is identified. No extra-axial collection is identified. No midline shift. A susceptibility weighted sequence was not performed. Mild generalized parenchymal atrophy. Vascular: Flow voids maintained within the proximal large arterial vessels. Skull and upper cervical spine: Within described limitations, no focal marrow lesion identified. Sinuses/Orbits: Trace scattered paranasal sinus mucosal thickening. Bilateral mastoid effusions. IMPRESSION: Prominent susceptibility artifact arising from a right-sided cochlear implant obscures large portions of the intracranial contents on multiple sequences and severely limits evaluation. The diffusion-weighted and T2 FLAIR sequences are essentially nondiagnostic, which precludes adequate evaluation for acute infarct. Within the significant limitations described, no definite acute intracranial abnormality is identified. Bilateral mastoid effusions. Electronically Signed   By: Kellie Simmering DO   On: 08/04/2019 21:41   US RENAL  Result Date: 08/02/2019 CLINICAL DATA:  86 year old female with renal failure. EXAM: RENAL / URINARY TRACT ULTRASOUND COMPLETE COMPARISON:  Abdomen ultrasound 05/08/2013. FINDINGS: Right Kidney: Renal measurements: 9.5 x 3.9 x 4.7 centimeters = volume: 90 mL. No right hydronephrosis or solid renal mass. Small 2 centimeter right upper pole cyst was not apparent in 2014. the right renal cortex appears echogenic compared to the liver on image 5. Left Kidney: Renal measurements: 10.1 x 4.4 x 4.4 centimeters = volume: 101 mL. Echogenic left kidney similar to that on  the right. No mass or  hydronephrosis visualized. Bladder: Diminutive.  Appears normal for degree of bladder distention. Other: None. IMPRESSION: No acute renal findings. Evidence of some chronic medical renal disease. Electronically Signed   By: Genevie Ann M.D.   On: 08/02/2019 13:48   DG CHEST PORT 1 VIEW  Result Date: 08/02/2019 CLINICAL DATA:  Dyspnea EXAM: PORTABLE CHEST 1 VIEW COMPARISON:  07/31/2019 FINDINGS: Cardiomegaly. Right chest port catheter. Diffuse bilateral interstitial pulmonary opacity and new, small layering bilateral pleural effusions. The visualized skeletal structures are unremarkable. IMPRESSION: 1. New, small bilateral pleural effusions. 2. Diffuse bilateral interstitial pulmonary opacity, most consistent with pulmonary edema. 3. Cardiomegaly. Electronically Signed   By: Eddie Candle M.D.   On: 08/02/2019 16:27   DG Chest Portable 1 View  Result Date: 07/31/2019 CLINICAL DATA:  Altered mental status and weakness EXAM: PORTABLE CHEST 1 VIEW COMPARISON:  07/29/2019; 05/29/2019 FINDINGS: Grossly unchanged cardiac silhouette and mediastinal contours. Stable positioning of support apparatus. No focal airspace opacities. No pleural effusion or pneumothorax. No evidence of edema. No acute osseous abnormalities. IMPRESSION: No acute cardiopulmonary disease. Electronically Signed   By: Sandi Mariscal M.D.   On: 07/31/2019 10:38   EEG adult  Result Date: 08/03/2019 Lora Havens, MD     08/03/2019  6:12 PM Patient Name: Liba Sibert MRN: [id number] Epilepsy Attending: Lora Havens Referring Physician/Provider: Dr Remi Haggard Aroor Date: 08/03/2019 Duration: 24.53 mins Patient history: 86yo f with ams.eeg to evaluate for seizure Level of alertness: awake AEDs during EEG study: None Technical aspects: This EEG study was done with scalp electrodes positioned according to the 10-20 International system of electrode placement. Electrical activity was acquired at a sampling rate of 500Hz  and reviewed with a high frequency  filter of 70Hz  and a low frequency filter of 1Hz . EEG data were recorded continuously and digitally stored. DESCRIPTION: No clear posterior dominant rhythm was seen. EEG showed continuous generalized 3-5hz  theta-delta slowing. Triphasic waves at 0.5-1hz , generalized, maximal bifrontal were also noted. Hyperventilation and photic stimulation were not performed. ABNORMALITY - Continuous slow, generalized - Triphasic waves, generalized IMPRESSION: This study is suggestive of severe diffuse encephalopathy, non specific to etiology but could be secondary to toxic-metabolic causes, cefepime use. No seizures or definite epileptiform discharges were seen throughout the recording. Lora Havens   ECHOCARDIOGRAM COMPLETE  Result Date: 08/01/2019   ECHOCARDIOGRAM REPORT   Patient Name:   DIAMONIQUE MOLLARD Date of Exam: 08/01/2019 Medical Rec #:  [id number]     Height:       64.0 in Accession #:    [id number]    Weight:       193.6 lb Date of Birth:  January 16, 1933    BSA:          1.93 m Patient Age:    58 years      BP:           137/51 mmHg Patient Gender: F             HR:           101 bpm. Exam Location:  Inpatient Procedure: 2D Echo, Cardiac Doppler and Color Doppler Indications:    Acute Cornonary Syndrome I24.9  History:        Patient has no prior history of Echocardiogram examinations.                 Risk Factors:Non-Smoker. Sepsis. Elevated troponin.  Sonographer:    Paulita Fujita RDCS Referring Phys: [id number] [facility]  Sonographer Comments: Technically difficult study due to poor echo windows. IMPRESSIONS  1. Left ventricular ejection fraction, by visual estimation, is 70 to 75%. The left ventricle has hyperdynamic function. There is mildly increased left ventricular hypertrophy. Turbulent LVOT flow without obvious gradient.  2. Left ventricular diastolic parameters are consistent with Grade I diastolic dysfunction (impaired relaxation).  3. The left ventricle has no regional wall motion abnormalities.  4.  Global right ventricle has normal systolic function.The right ventricular size is normal. No increase in right ventricular wall thickness.  5. Left atrial size was normal.  6. Right atrial size was normal.  7. Mild mitral annular calcification.  8. The mitral valve is grossly normal. Trivial mitral valve regurgitation.  9. The tricuspid valve is grossly normal. 10. The aortic valve is tricuspid. Aortic valve regurgitation is not visualized. 11. The pulmonic valve was grossly normal. Pulmonic valve regurgitation is trivial. 12. The inferior vena cava is normal in size with greater than 50% respiratory variability, suggesting right atrial pressure of 3 mmHg. 13. TR signal is inadequate for assessing pulmonary artery systolic pressure. FINDINGS  Left Ventricle: Left ventricular ejection fraction, by visual estimation, is 70 to 75%. The left ventricle has hyperdynamic function. The left ventricle has no regional wall motion abnormalities. The left ventricular internal cavity size was the left ventricle is normal in size. There is mildly increased left ventricular hypertrophy. Left ventricular diastolic parameters are consistent with Grade I diastolic dysfunction (impaired relaxation). Right Ventricle: The right ventricular size is normal. No increase in right ventricular wall thickness. Global RV systolic function is has normal systolic function. Left Atrium: Left atrial size was normal in size. Right Atrium: Right atrial size was normal in size Pericardium: There is no evidence of pericardial effusion. Mitral Valve: The mitral valve is grossly normal. Mild mitral annular calcification. Trivial mitral valve regurgitation. Tricuspid Valve: The tricuspid valve is grossly normal. Tricuspid valve regurgitation is trivial. Aortic Valve: The aortic valve is tricuspid. Aortic valve regurgitation is not visualized. Mild aortic valve annular calcification. Pulmonic Valve: The pulmonic valve was grossly normal. Pulmonic valve  regurgitation is trivial. Pulmonic regurgitation is trivial. Aorta: The aortic root is normal in size and structure. Venous: The inferior vena cava is normal in size with greater than 50% respiratory variability, suggesting right atrial pressure of 3 mmHg. IAS/Shunts: No atrial level shunt detected by color flow Doppler.  LEFT VENTRICLE PLAX 2D LVOT diam:     1.70 cm  Diastology LVOT Area:     2.27 cm LV e' lateral:   5.87 cm/s                         LV E/e' lateral: 17.2                         LV e' medial:    5.11 cm/s                         LV E/e' medial:  19.8  LEFT ATRIUM             Index LA Vol (A2C):   24.2 ml 12.54 ml/m LA Vol (A4C):   36.0 ml 18.66 ml/m LA Biplane Vol: 31.1 ml 16.12 ml/m  AORTIC VALVE LVOT Vmax:   105.00 cm/s LVOT Vmean:  79.900 cm/s LVOT VTI:    0.248 m MITRAL VALVE MV Area (PHT): 1.63 cm  SHUNTS MV PHT:        134.85 msec           Systemic VTI:  0.25 m MV Decel Time: 465 msec              Systemic Diam: 1.70 cm MV E velocity: 101.00 cm/s 103 cm/s MV A velocity: 160.00 cm/s 70.3 cm/s MV E/A ratio:  0.63        1.5  Rozann Lesches MD Electronically signed by Rozann Lesches MD Signature Date/Time: 08/01/2019/12:22:02 PM    Final       Subjective: Patient seen and examined at bedside, moaning.  Otherwise unable to communicate.  Son present at bedside, he has been updated by medical oncology, Dr. Burr Medico regarding the poor prognosis.  Dr. Burr Medico also touch base with patient's primary oncologist at [facility], and that they would have no other treatment options to offer at this time with recommendations of palliative care.  Patient's son does not want her mom to be uncomfortable anymore, and request transitioning of her care to comfort measures at this time.  Discharging to residential hospice this afternoon.  Discharge Exam: Vitals:   08/05/19 0153 08/05/19 0559  BP:  (!) 154/83  Pulse:  87  Resp:  16  Temp:  97.8 F (36.6 C)  SpO2: 100% 100%   Vitals:   08/04/19  2220 08/05/19 0052 08/05/19 0153 08/05/19 0559  BP: (!) 114/48 139/86  (!) 154/83  Pulse: 69 80  87  Resp: 15 16  16   Temp: 97.9 F (36.6 C) 97.6 F (36.4 C)  97.8 F (36.6 C)  TempSrc: Axillary Temporal    SpO2: 99% 100% 100% 100%  Weight:      Height:        General exam: No acute distress, moaning Respiratory system: Breath sounds slightly decreased bilateral bases, no wheezing/crackles, normal respiratory effort, on 2 L nasal cannula with SPO2 100% Cardiovascular system: S1 & S2 heard, RRR. No JVD, murmurs, rubs, gallops or clicks. No pedal edema. Gastrointestinal system: Abdomen is nondistended, soft and nontender. No organomegaly or masses felt. Normal bowel sounds heard. Central nervous system: Obtunded, intermittent agitation with moaning Extremities: Symmetric 5 x 5 power. Skin: No rashes, lesions or ulcers Psychiatry: He is with intermittent agitation, moaning    The results of significant diagnostics from this hospitalization (including imaging, microbiology, ancillary and laboratory) are listed below for reference.     Microbiology: Recent Results (from the past 240 hour(s))  Urine culture     Status: None   Collection Time: 07/31/19  2:36 PM   Specimen: In/Out Cath Urine  Result Value Ref Range Status   Specimen Description   Final    IN/OUT CATH URINE Performed at [facility], [place] [address], [place], [place] [address]    Special Requests   Final    NONE Performed at [facility], [place] [address] [place], [place] [address]    Culture   Final    NO GROWTH Performed at [facility], [place] [address], [place], [place] [address]    Report Status 08/01/2019 FINAL  Final  SARS CORONAVIRUS 2 (TAT 6-24 HRS) Nasopharyngeal Nasopharyngeal Swab     Status: None   Collection Time: 07/31/19 10:25 PM   Specimen: Nasopharyngeal Swab  Result Value Ref Range Status   SARS Coronavirus 2 NEGATIVE NEGATIVE Final    Comment:  (NOTE) SARS-CoV-2 target nucleic acids are NOT DETECTED. The SARS-CoV-2 RNA is generally detectable  in upper and lower respiratory specimens during the acute phase of infection. Negative results do not preclude SARS-CoV-2 infection, do not rule out co-infections with other pathogens, and should not be used as the sole basis for treatment or other patient management decisions. Negative results must be combined with clinical observations, patient history, and epidemiological information. The expected result is Negative. Fact Sheet for Patients: SugarRoll.be Fact Sheet for Healthcare Providers: https://www.woods-mathews.com/ This test is not yet approved or cleared by the [place] FDA and  has been authorized for detection and/or diagnosis of SARS-CoV-2 by FDA under an Emergency Use Authorization (EUA). This EUA will remain  in effect (meaning this test can be used) for the duration of the COVID-19 declaration under Section 56 4(b)(1) of the Act, 21 U.S.C. section 360bbb-3(b)(1), unless the authorization is terminated or revoked sooner. Performed at [facility], [place] [address], [place], [place] [address]   Blood Culture (routine x 2)     Status: None (Preliminary result)   Collection Time: 08/01/19  6:08 AM   Specimen: BLOOD  Result Value Ref Range Status   Specimen Description   Final    BLOOD LEFT ANTECUBITAL Performed at [place] [address] [place], [place] [address]    Special Requests   Final    BOTTLES DRAWN AEROBIC ONLY Blood Culture adequate volume Performed at [place] [address], [place], [place] [address]    Culture   Final    NO GROWTH 4 DAYS Performed at [facility], [place] [address], [place], [place] [address]    Report Status PENDING  Incomplete  Blood Culture (routine x 2)     Status: None (Preliminary result)   Collection Time: 08/01/19  6:08 AM    Specimen: BLOOD LEFT HAND  Result Value Ref Range Status   Specimen Description   Final    BLOOD LEFT HAND Performed at [facility], [place] [address] [place], [place] [address]    Special Requests   Final    BOTTLES DRAWN AEROBIC ONLY Blood Culture adequate volume Performed at [place] [address], [place], [place] [address]    Culture   Final    NO GROWTH 4 DAYS Performed at [facility], [place] [address], [place], [place] [address]    Report Status PENDING  Incomplete     Labs: BNP (last 3 results) Recent Labs    10/24/18 1130  BNP [id number]   Basic Metabolic Panel: Recent Labs  Lab 08/02/19 0357 08/03/19 0344 08/03/19 1953 08/04/19 0325 08/05/19 0441  NA 143 145 148* 146* 144  K 4.1 5.4* 5.3* 5.1 4.8  CL 117* 119* 121* 118* 109  CO2 16* 15* 14* 15* 21*  GLUCOSE 108* 140* 163* 209* 238*  BUN 44* 57* 78* 84* 100*  CREATININE 2.51* 2.72* 3.39* 3.61* 3.99*  CALCIUM 8.2* 8.2* 8.2* 8.2* 8.1*  MG  --   --   --   --  2.3   Liver Function Tests: Recent Labs  Lab 07/31/19 0914 08/02/19 0357 08/03/19 0344 08/04/19 0325 08/05/19 0441  AST 35 36 34 32 27  ALT 24 20 21 22 21   ALKPHOS 71 73 84 87 79  BILITOT 0.8 0.7 1.0 0.7 0.9  PROT 6.5 5.6* 5.8* 5.7* 5.5*  ALBUMIN 2.6* 2.1* 2.1* 2.0* 2.1*   Recent Labs  Lab 07/31/19 0936  LIPASE 27   Recent Labs  Lab 08/03/19 2028 08/05/19  0441  AMMONIA 44* 35   CBC: Recent Labs  Lab 07/31/19 0914 08/02/19 0357 08/02/19 1856 08/03/19 0344 08/03/19 0629 08/04/19 0325 08/05/19 0441  WBC 18.2* 24.8* 29.7* 35.5* 38.5* 45.4* 37.0*  NEUTROABS 10.3* 21.8*  --  28.4*  --  36.3* 26.3*  HGB 9.9* 7.2* 8.1* 8.4* 8.4* 7.8* 8.9*  HCT 29.3* 22.8* 25.2* 26.3* 25.9* 23.5* 26.3*  MCV 87.2 91.6 90.3 90.4 90.2 89.7 85.9  PLT 14* 34* 19* 57* 45* 29* 12*   Cardiac Enzymes: Recent Labs  Lab 08/01/19 1306 08/05/19 0441  CKTOTAL 81 28*   BNP: Invalid input(s): POCBNP CBG: Recent Labs  Lab  07/31/19 1010 08/01/19 0413 08/02/19 1028 08/03/19 0102  GLUCAP 81 96 88 117*   D-Dimer No results for input(s): DDIMER in the last 72 hours. Hgb A1c No results for input(s): HGBA1C in the last 72 hours. Lipid Profile No results for input(s): CHOL, HDL, LDLCALC, TRIG, CHOLHDL, LDLDIRECT in the last 72 hours. Thyroid function studies No results for input(s): TSH, T4TOTAL, T3FREE, THYROIDAB in the last 72 hours.  Invalid input(s): FREET3 Anemia work up Recent Labs    08/03/19 1953  VITAMINB12 1,386*   Urinalysis    Component Value Date/Time   COLORURINE YELLOW 07/31/2019 1436   APPEARANCEUR HAZY (A) 07/31/2019 1436   LABSPEC 1.018 07/31/2019 1436   PHURINE 5.0 07/31/2019 1436   GLUCOSEU NEGATIVE 07/31/2019 1436   HGBUR NEGATIVE 07/31/2019 1436   BILIRUBINUR NEGATIVE 07/31/2019 1436   KETONESUR 5 (A) 07/31/2019 1436   PROTEINUR 30 (A) 07/31/2019 1436   UROBILINOGEN 0.2 08/13/2014 2159   NITRITE NEGATIVE 07/31/2019 1436   LEUKOCYTESUR NEGATIVE 07/31/2019 1436   Sepsis Labs Invalid input(s): PROCALCITONIN,  WBC,  LACTICIDVEN Microbiology Recent Results (from the past 240 hour(s))  Urine culture     Status: None   Collection Time: 07/31/19  2:36 PM   Specimen: In/Out Cath Urine  Result Value Ref Range Status   Specimen Description   Final    IN/OUT CATH URINE Performed at [facility], [place] [address] [place], [place] [address]    Special Requests   Final    NONE Performed at [facility], [place] [address], [place], [place] [address]    Culture   Final    NO GROWTH Performed at [facility], [place] [address] [place], [place] [address]    Report Status 08/01/2019 FINAL  Final  SARS CORONAVIRUS 2 (TAT 6-24 HRS) Nasopharyngeal Nasopharyngeal Swab     Status: None   Collection Time: 07/31/19 10:25 PM   Specimen: Nasopharyngeal Swab  Result Value Ref Range Status   SARS Coronavirus 2 NEGATIVE NEGATIVE Final    Comment:  (NOTE) SARS-CoV-2 target nucleic acids are NOT DETECTED. The SARS-CoV-2 RNA is generally detectable in upper and lower respiratory specimens during the acute phase of infection. Negative results do not preclude SARS-CoV-2 infection, do not rule out co-infections with other pathogens, and should not be used as the sole basis for treatment or other patient management decisions. Negative results must be combined with clinical observations, patient history, and epidemiological information. The expected result is Negative. Fact Sheet for Patients: SugarRoll.be Fact Sheet for Healthcare Providers: https://www.woods-mathews.com/ This test is not yet approved or cleared by the [place] FDA and  has been authorized for detection and/or diagnosis of SARS-CoV-2 by FDA under an Emergency Use Authorization (EUA). This EUA will remain  in effect (meaning this test can be used) for the duration of  the COVID-19 declaration under Section 56 4(b)(1) of the Act, 21 U.S.C. section 360bbb-3(b)(1), unless the authorization is terminated or revoked sooner. Performed at [facility], [place] [address] [place], [place] [address]   Blood Culture (routine x 2)     Status: None (Preliminary result)   Collection Time: 08/01/19  6:08 AM   Specimen: BLOOD  Result Value Ref Range Status   Specimen Description   Final    BLOOD LEFT ANTECUBITAL Performed at [place] [address], [place], [place] [address]    Special Requests   Final    BOTTLES DRAWN AEROBIC ONLY Blood Culture adequate volume Performed at [place] [address] [place], [place] [address]    Culture   Final    NO GROWTH 4 DAYS Performed at [facility], [place] [address], [place], [place] [address]    Report Status PENDING  Incomplete  Blood Culture (routine x 2)     Status: None (Preliminary result)   Collection Time: 08/01/19  6:08 AM    Specimen: BLOOD LEFT HAND  Result Value Ref Range Status   Specimen Description   Final    BLOOD LEFT HAND Performed at [facility], [place] [address] [place], [place] [address]    Special Requests   Final    BOTTLES DRAWN AEROBIC ONLY Blood Culture adequate volume Performed at [place] [address], [place], [place] [address]    Culture   Final    NO GROWTH 4 DAYS Performed at [facility], [place] [address], [place], [place] [address]    Report Status PENDING  Incomplete     Time coordinating discharge: Over 30 minutes  SIGNED:   Harden Bramer J [place] DO  [facility] 08/05/2019, 4:04 PM

## 2019-08-05 NOTE — Progress Notes (Signed)
PROGRESS NOTE    Heather Olson  [id number] DOB: September 21, 1932 DOA: 07/31/2019 PCP: Johnston Ebbs, MD    Brief Narrative:   Heather Olson is a 86 y.o. female medical history significant ofMDS, myositis who presented with weakness for 3 days. Hx per son. He reports that Thursday he noted his mom was fatigued and lethargic. He took her to her oncologist, who was concerned for infection. Was given abx. He noticed over the next two days that she had increased weakness to the point that she couldn't walk or use her rollator. He noticed that she was confused. He noticed that she had frequent diarrhea. He became concerned because she had fevers and brought her to [facility].  She was found tobe septic. Started on vanc/cefepime. Found to have elevated troponin. Cardiology notified. HerOncologist teamwas notified   Assessment & Plan:   Active Problems:   MDS (myelodysplastic syndrome), high grade (HCC)   Sepsis due to undetermined organism (HCC)   Weakness   Diarrhea   AKI (acute kidney injury) ([place])   Elevated troponin   CKD (chronic kidney disease), stage III   Acute metabolic encephalopathy Patient presenting to ED with worsening confusion and weakness.  Seen by outpatient oncology and prescribed oral antibiotic; without significant improvement.  Initially thought patient was septic with leukocytosis and fever of 102.9 on admission; started on empiric antibiotics with vancomycin and cefepime which has subsequently been discontinued by ID on 08/03/2019.  Covid-19 test was negative.  Evaluated by neurology on 08/03/2019; etiology likely multifactorial with IV Dilaudid, steroids, cefepime induced encephalopathy, delirium, uremia from AKI versus worsening MDS.  Valproic acid level less than 10.  Ammonia level 44.  EEG for severe diffuse encephalopathy, nonspecific, no seizures or epileptiform discharges. Continues to be afebrile, antibiotics now discontinued as of 08/03/2019 by ID.  MR brain  nondiagnostic secondary to cochlear implant distortion.  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  Case was discussed with oncology, Dr. Burr Medico and patient's primary oncologist at [facility], who did not have any further treatments to offer and recommended palliative/comfort measures.  Discussed with patient's son at bedside on 08/05/2019, he wishes his mother not to suffer any further and would like to transition care to comfort measures at this time. 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  Initially had fever of 102.9 with elevated white blood cell count.  Patient had been on IV decitabine; but recently transitioned to oral.  Follows with [facility] oncology, Dr. Leretha Pol.  Was treated with outpatient antibiotics without improvement.  Was initially started on empiric antibiotics with vancomycin and cefepime.  Procalcitonin was elevated at 18.80, although this in the setting of history of underlying MDS.  Also 1.6, within normal limits.  Was evaluated by infectious disease, Dr. Linus Salmons on 08/03/2019.  Differential includes infectious etiology, reactive leukocytosis from steroids, versus progression of her underlying MDS.  Antibiotics were discontinued by infectious disease on 08/03/2019. 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  Now transitioning to comfort measures as above  Generalized weakness Polymyositis Discontinue IV steroids  Thrombocytopenia Status post transfusion platelets on 1/3 and 08/02/2019.  No signs of  active bleeding.   Will discontinue any further lab draws and infusions now converting to comfort measures.  Anemia Status post 1 unit PRBC on 08/02/2019 and 08/04/2019.  No further transfusions or lab draws secondary to comfort measures.  Elevated troponin Etiology likely secondary to demand ischemia.  TTE with normal EF and grade 1 diastolic dysfunction.  Was initially seen by cardiology during the hospitalization, now signed off.   DVT prophylaxis: Hospice Code Status: DNR Family Communication: Discussed plan of care with patient's son present at bedside who wishes to transition to comfort measures. Disposition Plan: Transition to comfort measures, pending evaluation by residential hospice, anticipate in patient death versus transfer to residential hospice when bed available   Consultants:   Neurology  Infectious disease  Medical oncology  Procedures:   EEG  Antimicrobials:   Vancomycin 1/2 - 1/5  Cefepime 1/2 - 1/5  Acyclovir 1/2 - 1/7   Subjective: Patient seen and examined at bedside, moaning.  Otherwise unable to communicate.  Son present at bedside, he has been updated by medical oncology, Dr. Burr Medico regarding the poor prognosis.  Dr. Burr Medico also touch base with patient's primary oncologist at [facility], and that they would have no other treatment options to offer at this time with recommendations of palliative care.  Patient's son does not want her mom to be uncomfortable anymore, and request transitioning of her care to comfort measures at this time. No acute concerns overnight per nursing staff.  Objective: Vitals:   08/04/19 2220 08/05/19 0052 08/05/19 0153 08/05/19 0559  BP: (!) 114/48 139/86  (!) 154/83  Pulse: 69 80  87  Resp: 15 16  16   Temp: 97.9 F (36.6 C) 97.6 F (36.4 C)  97.8 F (36.6 C)  TempSrc: Axillary Temporal    SpO2: 99% 100% 100% 100%  Weight:      Height:        Intake/Output Summary (Last 24 hours) at 08/05/2019 1358 Last data filed at 08/05/2019 0600 Gross per 24  hour  Intake 1315 ml  Output --  Net 1315 ml   Filed Weights   07/31/19 0919  Weight: 87.8 kg    Examination:  General exam: No acute distress, moaning Respiratory system: Breath sounds slightly decreased bilateral bases, no wheezing/crackles, normal respiratory effort, on 2 L nasal cannula with SPO2 100% Cardiovascular system: S1 & S2 heard, RRR. No JVD, murmurs, rubs, gallops or clicks. No pedal edema. Gastrointestinal system: Abdomen is nondistended, soft and nontender. No organomegaly or masses felt. Normal bowel sounds heard. Central nervous system: Obtunded, intermittent agitation with moaning Extremities: Symmetric 5 x 5 power. Skin: No rashes, lesions or ulcers Psychiatry: He is with intermittent agitation, moaning    Data Reviewed: I have personally reviewed following labs and imaging studies  CBC: Recent Labs  Lab 07/31/19 0914 08/02/19 0357 08/02/19 1856 08/03/19 0344 08/03/19 0629 08/04/19 0325 08/05/19 0441  WBC 18.2* 24.8* 29.7* 35.5* 38.5* 45.4* 37.0*  NEUTROABS 10.3* 21.8*  --  28.4*  --  36.3* 26.3*  HGB 9.9* 7.2* 8.1* 8.4* 8.4* 7.8* 8.9*  HCT 29.3* 22.8* 25.2* 26.3* 25.9* 23.5* 26.3*  MCV 87.2 91.6 90.3 90.4 90.2 89.7 85.9  PLT 14* 34* 19* 57* 45* 29* 12*   Basic Metabolic Panel: Recent Labs  Lab 08/02/19 0357 08/03/19 0344 08/03/19 1953 08/04/19 0325 08/05/19 0441  NA 143 145 148* 146* 144  K 4.1 5.4* 5.3* 5.1 4.8  CL 117* 119* 121* 118* 109  CO2 16* 15* 14* 15* 21*  GLUCOSE 108* 140* 163* 209* 238*  BUN 44* 57* 78* 84* 100*  CREATININE 2.51* 2.72* 3.39* 3.61* 3.99*  CALCIUM 8.2* 8.2* 8.2* 8.2* 8.1*  MG  --   --   --   --  2.3   GFR: Estimated Creatinine Clearance: 11.3 mL/min (A) (by C-G formula based on SCr of 3.99 mg/dL (H)). Liver Function Tests: Recent Labs  Lab 07/31/19 0914 08/02/19 0357 08/03/19 0344 08/04/19 0325 08/05/19 0441  AST 35 36 34 32 27  ALT 24 20 21 22 21   ALKPHOS 71 73 84 87 79  BILITOT 0.8 0.7 1.0 0.7  0.9  PROT 6.5 5.6* 5.8* 5.7* 5.5*  ALBUMIN 2.6* 2.1* 2.1* 2.0* 2.1*   Recent Labs  Lab 07/31/19 0936  LIPASE 27   Recent Labs  Lab 08/03/19 2028 08/05/19 0441  AMMONIA 44* 35   Coagulation Profile: Recent Labs  Lab 07/31/19 1000 08/01/19 0500  INR 1.6* 1.7*   Cardiac Enzymes: Recent Labs  Lab 08/01/19 1306 08/05/19 0441  CKTOTAL 81 28*   BNP (last 3 results) No results for input(s): PROBNP in the last 8760 hours. HbA1C: No results for input(s): HGBA1C in the last 72 hours. CBG: Recent Labs  Lab 07/31/19 1010 08/01/19 0413 08/02/19 1028 08/03/19 0102  GLUCAP 81 96 88 117*   Lipid Profile: No results for input(s): CHOL, HDL, LDLCALC, TRIG, CHOLHDL, LDLDIRECT in the last 72 hours. Thyroid Function Tests: No results for input(s): TSH, T4TOTAL, FREET4, T3FREE, THYROIDAB in the last 72 hours. Anemia Panel: Recent Labs    08/03/19 1953  VITAMINB12 1,386*   Sepsis Labs: Recent Labs  Lab 07/31/19 0923 08/01/19 0500  PROCALCITON  --  18.80  LATICACIDVEN 1.6  --     Recent Results (from the past 240 hour(s))  Urine culture     Status: None   Collection Time: 07/31/19  2:36 PM   Specimen: In/Out Cath Urine  Result Value Ref Range Status   Specimen Description   Final    IN/OUT CATH URINE Performed at [facility], [place] [address] [place], [place] [address]    Special Requests   Final    NONE Performed at [facility], [place] [address], [place], [place] [address]    Culture   Final    NO GROWTH Performed at [facility], [place] [address], [place], [place] [address]    Report Status 08/01/2019 FINAL  Final  SARS CORONAVIRUS 2 (TAT 6-24 HRS) Nasopharyngeal Nasopharyngeal Swab     Status: None   Collection Time: 07/31/19 10:25 PM   Specimen: Nasopharyngeal Swab  Result Value Ref Range Status   SARS Coronavirus 2 NEGATIVE NEGATIVE Final    Comment: (NOTE) SARS-CoV-2 target nucleic acids are NOT DETECTED. The  SARS-CoV-2 RNA is generally detectable in upper and lower respiratory specimens during the acute phase of infection. Negative results do not preclude SARS-CoV-2 infection, do not rule out co-infections with other pathogens, and should not be used as the sole basis for treatment or other patient management decisions. Negative results must be combined with clinical observations, patient history, and epidemiological information. The expected result is Negative. Fact Sheet for Patients: SugarRoll.be Fact Sheet for Healthcare Providers: https://www.woods-mathews.com/ This test is not yet approved or cleared by the [place] FDA and  has been authorized for detection and/or diagnosis of SARS-CoV-2 by FDA under an Emergency Use Authorization (EUA). This EUA will remain  in effect (meaning this test can be used) for the  duration of the COVID-19 declaration under Section 56 4(b)(1) of the Act, 21 U.S.C. section 360bbb-3(b)(1), unless the authorization is terminated or revoked sooner. Performed at [facility], [place] [address], [place], [place] [address]   Blood Culture (routine x 2)     Status: None (Preliminary result)   Collection Time: 08/01/19  6:08 AM   Specimen: BLOOD  Result Value Ref Range Status   Specimen Description   Final    BLOOD LEFT ANTECUBITAL Performed at [place] [address], [place], [place] [address]    Special Requests   Final    BOTTLES DRAWN AEROBIC ONLY Blood Culture adequate volume Performed at [place] [address], [place], [place] [address]    Culture   Final    NO GROWTH 4 DAYS Performed at [facility], [place] [address] [place], [place] [address]    Report Status PENDING  Incomplete  Blood Culture (routine x 2)     Status: None (Preliminary result)   Collection Time: 08/01/19  6:08 AM   Specimen: BLOOD LEFT HAND  Result Value Ref Range Status    Specimen Description   Final    BLOOD LEFT HAND Performed at [facility], [place] [address] [place], [place] [address]    Special Requests   Final    BOTTLES DRAWN AEROBIC ONLY Blood Culture adequate volume Performed at [place] [address], [place], [place] [address]    Culture   Final    NO GROWTH 4 DAYS Performed at [facility], [place] [address], [place], [place] [address]    Report Status PENDING  Incomplete         Radiology Studies: MR BRAIN WO CONTRAST  Result Date: 08/04/2019 CLINICAL DATA:  Encephalopathy. Additional history provided: Progressive fatigue, weakness and decline. History of MDS, myositis presenting with weakness for 3 days. EXAM: MRI HEAD WITHOUT CONTRAST TECHNIQUE: Multiplanar, multiecho pulse sequences of the brain and surrounding structures were obtained without intravenous contrast. COMPARISON:  Head CT 08/02/2019 FINDINGS: Brain: Marked susceptibility artifact arising from a right-sided cochlear implant obscures large portions of the intracranial contents on multiple sequences and severely limits evaluation. The diffusion-weighted and axial FLAIR imaging is essentially nondiagnostic. This precludes adequate evaluation for acute infarct. No intracranial mass is identified. Apart from T2/FLAIR hyperintensity along the margins of the lateral ventricles, which is not uncommonly seen in patients of this age, no definite focal parenchymal signal abnormality is identified. No extra-axial collection is identified. No midline shift. A susceptibility weighted sequence was not performed. Mild generalized parenchymal atrophy. Vascular: Flow voids maintained within the proximal large arterial vessels. Skull and upper cervical spine: Within described limitations, no focal marrow lesion identified. Sinuses/Orbits: Trace scattered paranasal sinus mucosal thickening. Bilateral mastoid effusions. IMPRESSION: Prominent susceptibility artifact arising  from a right-sided cochlear implant obscures large portions of the intracranial contents on multiple sequences and severely limits evaluation. The diffusion-weighted and T2 FLAIR sequences are essentially nondiagnostic, which precludes adequate evaluation for acute infarct. Within the significant limitations described, no definite acute intracranial abnormality is identified. Bilateral mastoid effusions. Electronically Signed   By: Kellie Simmering DO   On: 08/04/2019 21:41   EEG adult  Result Date: 08/03/2019 Lora Havens, MD     08/03/2019  6:12 PM Patient Name: Lilliani Kojima MRN: [id number] Epilepsy Attending: Lora Havens Referring Physician/Provider: Dr Remi Haggard [place] Date: 08/03/2019 Duration: 24.53 mins Patient history: 86yo f with  ams.eeg to evaluate for seizure Level of alertness: awake AEDs during EEG study: None Technical aspects: This EEG study was done with scalp electrodes positioned according to the 10-20 International system of electrode placement. Electrical activity was acquired at a sampling rate of 500Hz  and reviewed with a high frequency filter of 70Hz  and a low frequency filter of 1Hz . EEG data were recorded continuously and digitally stored. DESCRIPTION: No clear posterior dominant rhythm was seen. EEG showed continuous generalized 3-5hz  theta-delta slowing. Triphasic waves at 0.5-1hz , generalized, maximal bifrontal were also noted. Hyperventilation and photic stimulation were not performed. ABNORMALITY - Continuous slow, generalized - Triphasic waves, generalized IMPRESSION: This study is suggestive of severe diffuse encephalopathy, non specific to etiology but could be secondary to toxic-metabolic causes, cefepime use. No seizures or definite epileptiform discharges were seen throughout the recording. Priyanka Barbra Sarks        Scheduled Meds:  sodium chloride   Intravenous Once   sodium chloride   Intravenous Once   chlorhexidine  15 mL Mouth Rinse BID   Chlorhexidine  Gluconate Cloth  6 each Topical Daily   furosemide       lidocaine  2 patch Transdermal Q24H   mouth rinse  15 mL Mouth Rinse q12n4p   sodium chloride flush  10-40 mL Intracatheter Q12H   Continuous Infusions:  HYDROmorphone 1 mg/hr (08/05/19 1133)     LOS: 5 days    Time spent: 39 minutes spent on chart review, discussion with nursing staff, consultants, updating family and interview/physical exam; more than 50% of that time was spent in counseling and/or coordination of care.    Aleeah Greeno J [place] DO [facility] 08/05/2019, 1:58 PM

## 2019-08-05 NOTE — Progress Notes (Signed)
Oncology follow up  Pt was transitioned to comfort care this morning, on Dilaudid drip, appears to be comfortable, unresponsive.  Nurse at bedside when I saw her around noon.  I spoke with pathologist this morning, her peripheral blood smear showed 22% blasts, unfortunately her MDS has transformed to AML now.  I called her son and discussed the above.  I reassured him that both her doctors and he have done everything we can to treat her, and no treatment is available for her AML. He is in complete agreement for hospice. He has spoken with [facility] and pt was accepted. He very much appreciate our care.   Truitt Merle  08/05/2019

## 2019-08-05 NOTE — Progress Notes (Signed)
[facility] room available for patient today.   Paperwork completed with her son Izell [place].  Please send discharge summary to [phone] prior to patient leaving the unit.   RN please call report to [phone] prior to patient leaving the unit.   Thank you,  Erling Conte, LCSW [phone]

## 2019-08-05 NOTE — Progress Notes (Signed)
Interim Note  MDS has transformed into AML, explaining leukocytosis rather than sepsis BUN also >100, patient going into acute renal failure. It appears after discussion with son, patient  transitioned to hospice.  Neurology will sign off.

## 2019-08-05 NOTE — Progress Notes (Signed)
Pt discharging to [facility] today per Dr. [place] Pt's PIV d/c'd and WDL. Right chest port-a-cath flushed and site WDL. VSS. Report called to [place], Therapist, sports. Verbalized understanding. Pt currently awaiting [facility] arrival for transport.

## 2019-08-05 NOTE — Progress Notes (Signed)
Per Respiratory Therapist, the patient sounds wet/crackles and NEB treatment was ineffective. Paged floor coverage.

## 2019-08-06 LAB — CULTURE, BLOOD (ROUTINE X 2)
Culture: NO GROWTH
Culture: NO GROWTH
Special Requests: ADEQUATE
Special Requests: ADEQUATE

## 2019-08-06 LAB — VITAMIN B1: Vitamin B1 (Thiamine): 177.5 nmol/L (ref 66.5–200.0)

## 2019-08-06 NOTE — Progress Notes (Signed)
Misc waste remainder of dilaudid drip into stericycle,  Witness [place] representative

## 2019-08-16 ENCOUNTER — Other Ambulatory Visit: Payer: Medicare Other

## 2019-08-16 ENCOUNTER — Ambulatory Visit: Payer: Medicare Other | Admitting: Hematology

## 2019-08-30 DEATH — deceased

## 2020-03-27 IMAGING — CT CT HEAD W/O CM
3 series · 15 of 47 positions shown, 18 images · non-contrast
Comparison: Head CT 10/24/2018 and earlier.

CLINICAL DATA: 86-year-old female with lethargy, decreased
respiratory effort, delirium.

EXAM:
CT HEAD WITHOUT CONTRAST
TECHNIQUE: Contiguous axial images were obtained from the base of the skull
through the vertex without intravenous contrast.

[Series 2: head (person_name) (person_name) · axial · 0.42mm/px · z∈[-116,+19]mm · 9 of 33 slices shown, 12 images]
[im 3/33  brain]
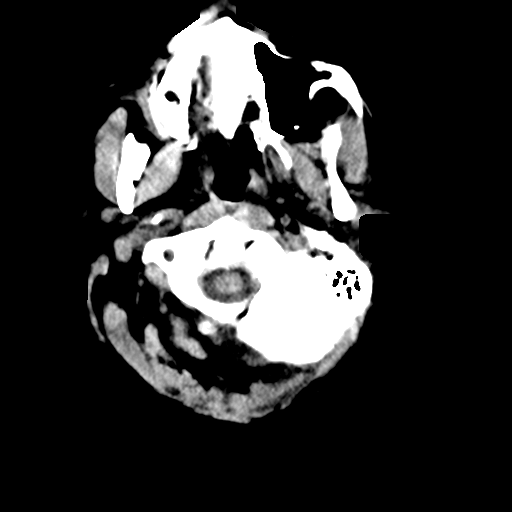
[im 3/33  bone]
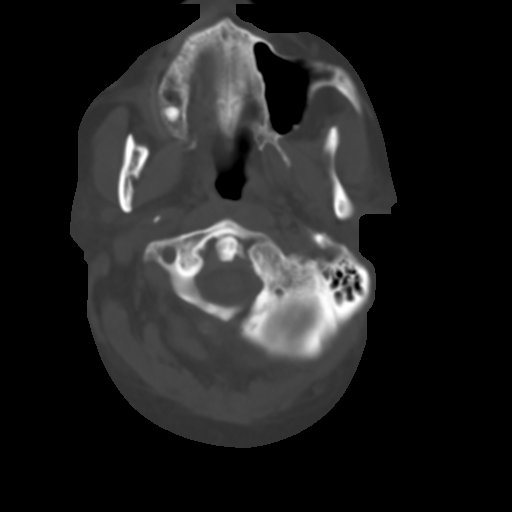
[im 6/33  brain]
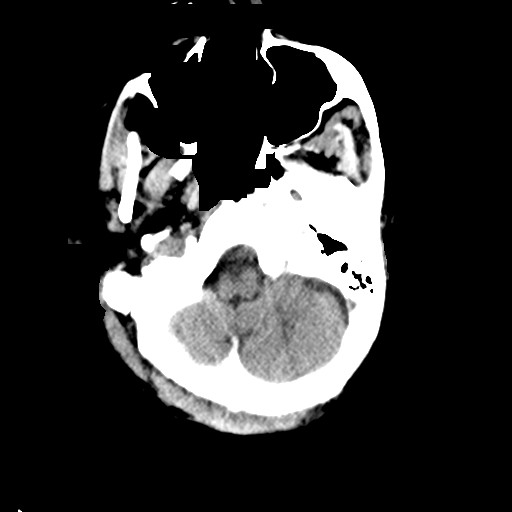
[im 9/33  brain]
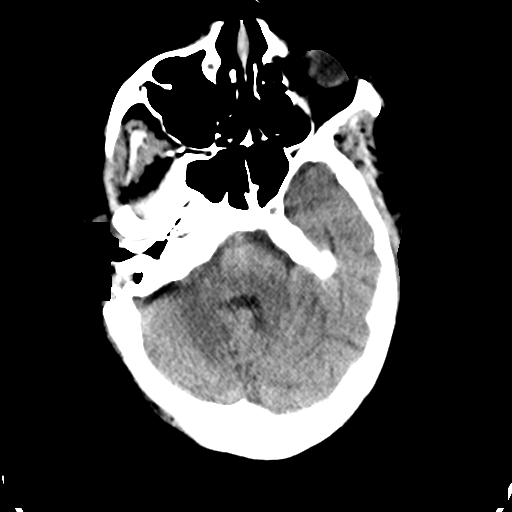
[im 13/33  brain]
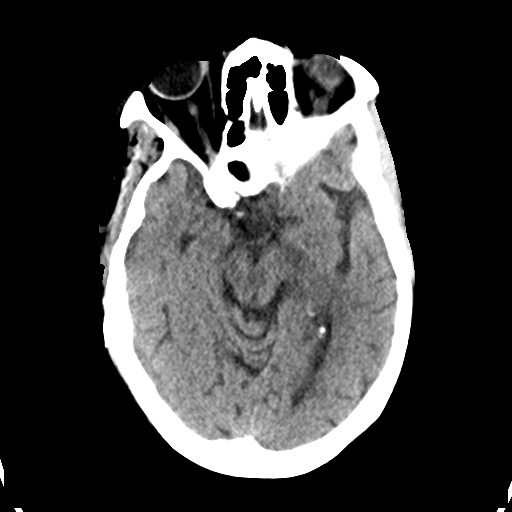
[im 17/33  brain]
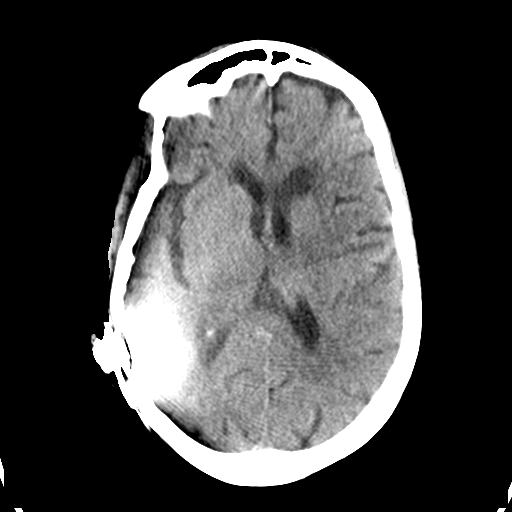
[im 17/33  bone]
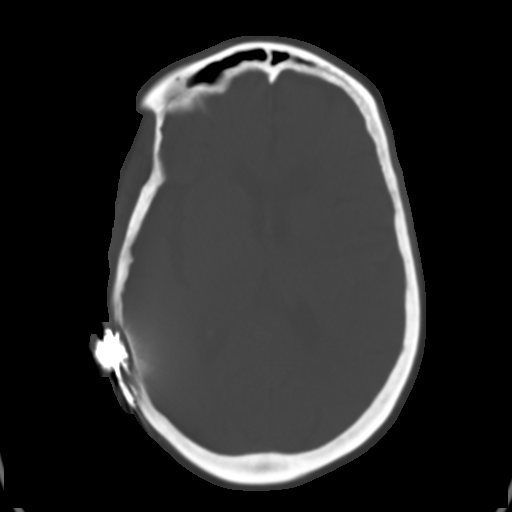
[im 20/33  brain]
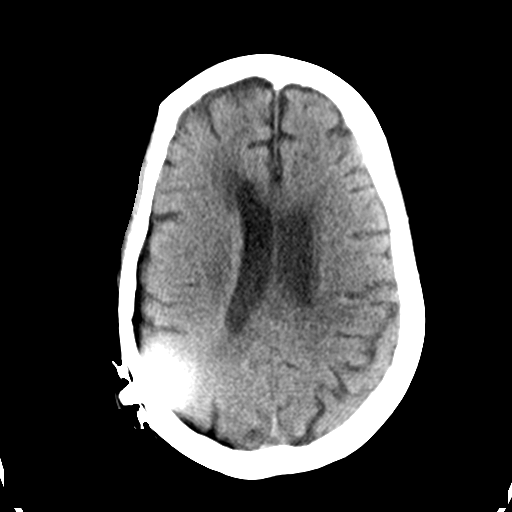
[im 24/33  brain]
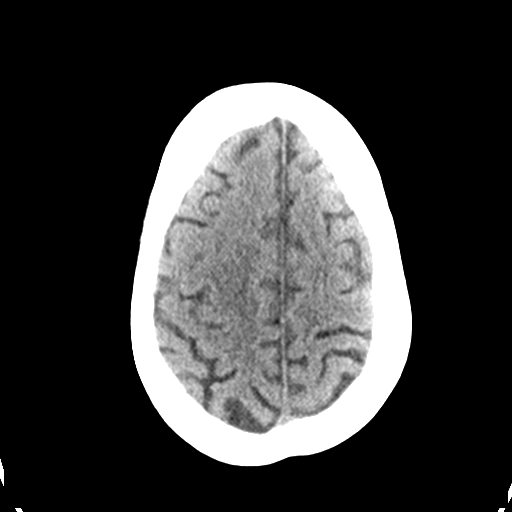
[im 27/33  brain]
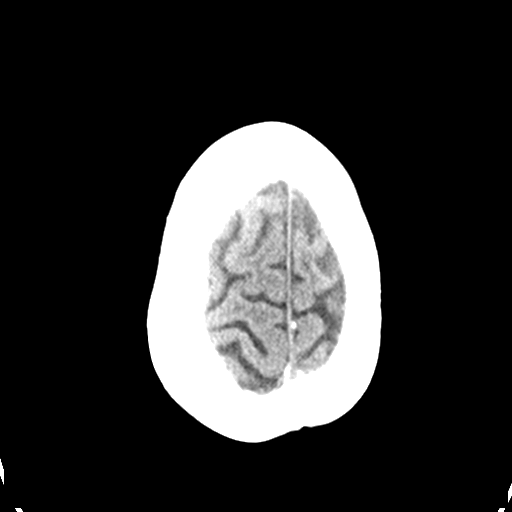
[im 30/33  brain]
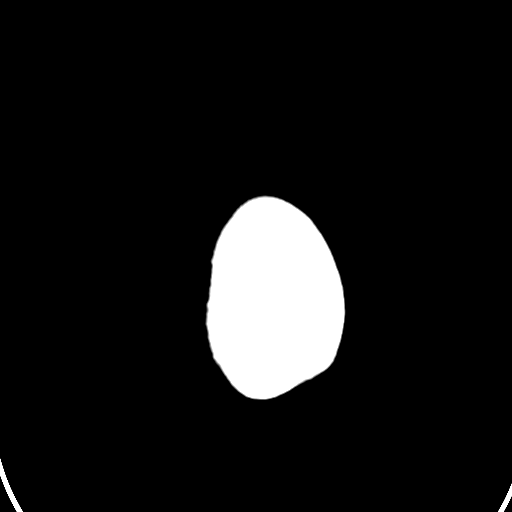
[im 30/33  bone]
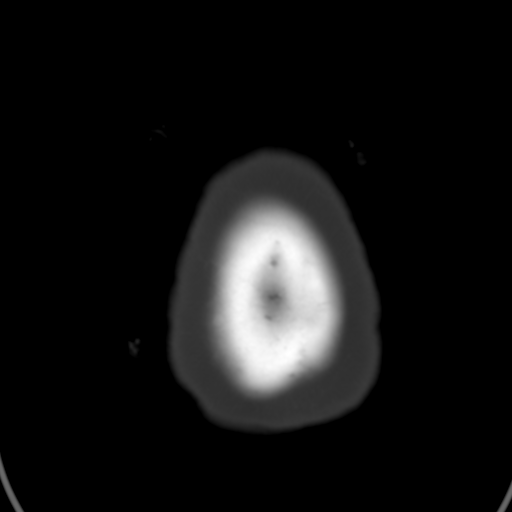

[Series 4: coronal soft tissue · coronal · 0.29mm/px · 3 of 67 slices shown]
[im 23/67  brain]
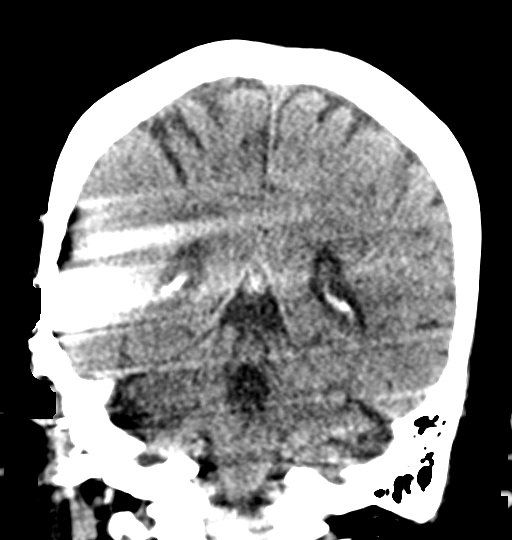
[im 30/67  brain]
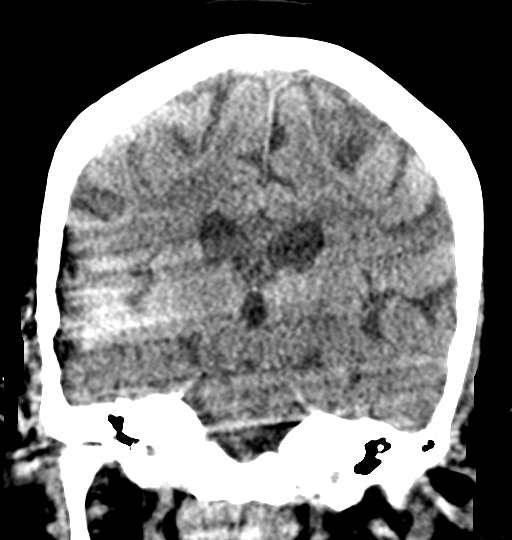
[im 37/67  brain]
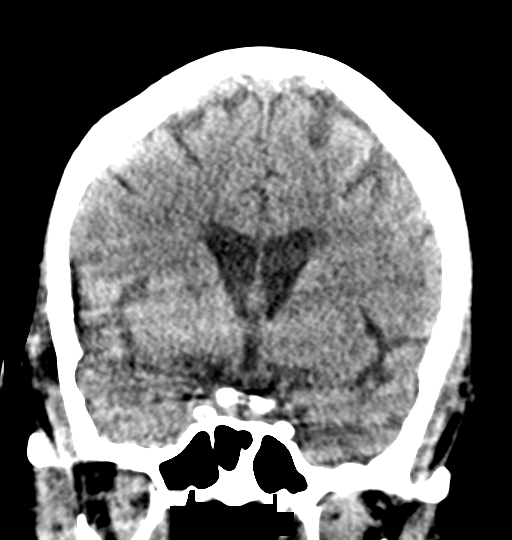

[Series 5: sagittal soft tissue · sagittal · 0.30mm/px · 3 of 50 slices shown]
[im 18/50  brain]
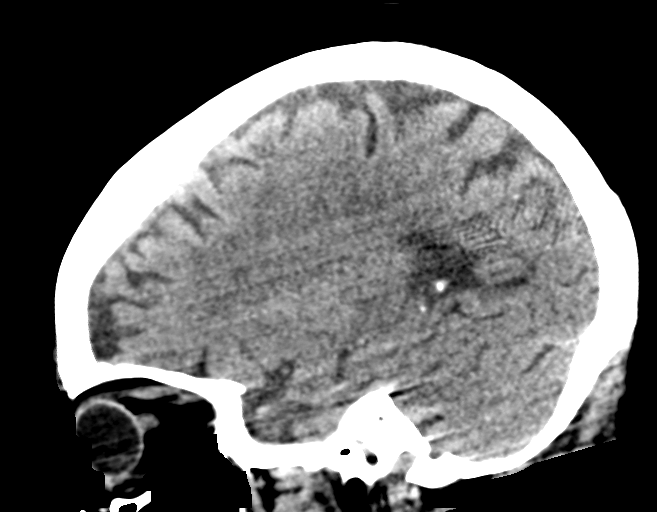
[im 25/50  brain]
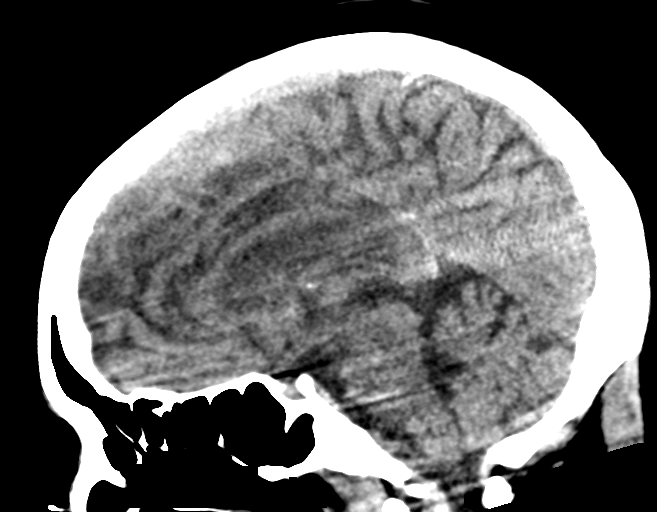
[im 33/50  brain]
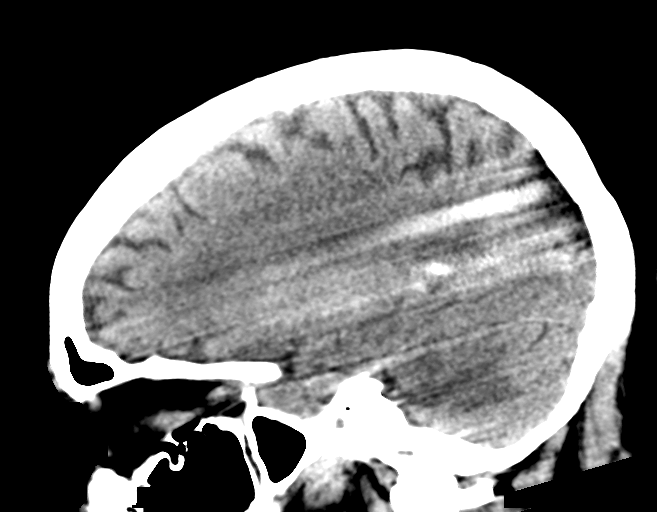

[15 of 47 positions shown; findings below may reference images not displayed]

FINDINGS: Brain: Streak artifact from right cochlear implant device again
noted. Cerebral volume is stable since 2828 and within normal limits
for age.

No midline shift, ventriculomegaly, mass effect, evidence of mass
lesion, intracranial hemorrhage or evidence of cortically based
acute infarction.

Questionable increased hypodensity in the left basal ganglia and
internal capsule compared to 10/24/2018 (series 2, image 16) this is
in the region of streak artifact. Elsewhere cerebral white matter
hypodensity appears stable.

Vascular: Calcified atherosclerosis at the skull base. No suspicious
intracranial vascular hyperdensity.

Skull: No acute osseous abnormality identified.

Sinuses/Orbits: Previous right mastoidectomy with improved right
tympanic cavity and mastoid defect pneumatization since 2828. Other
Visualized paranasal sinuses and mastoids are stable and well
pneumatized.

Other: Right side cochlear implant with generator device along the
right posterior convexity and associated streak artifact. No acute
orbit or scalp soft tissue finding.
IMPRESSION: 1. Streak artifact related to chronic right cochlear implant with
real versus artifactual new hypodensity in the left basal ganglia
since 10/24/2018. If there is no right side weakness/deficits then
artifact would be favored.
2. Otherwise stable noncontrast CT appearance of the brain.

## 2020-03-27 IMAGING — US US RENAL
1 series · 14 of 25 positions shown · non-contrast
Comparison: Abdomen ultrasound 05/08/2013.

CLINICAL DATA: 86-year-old female with renal failure.

EXAM:
RENAL / URINARY TRACT ULTRASOUND COMPLETE

[Series 1: us renal · 14 of 39 slices shown]
[im 1/39]
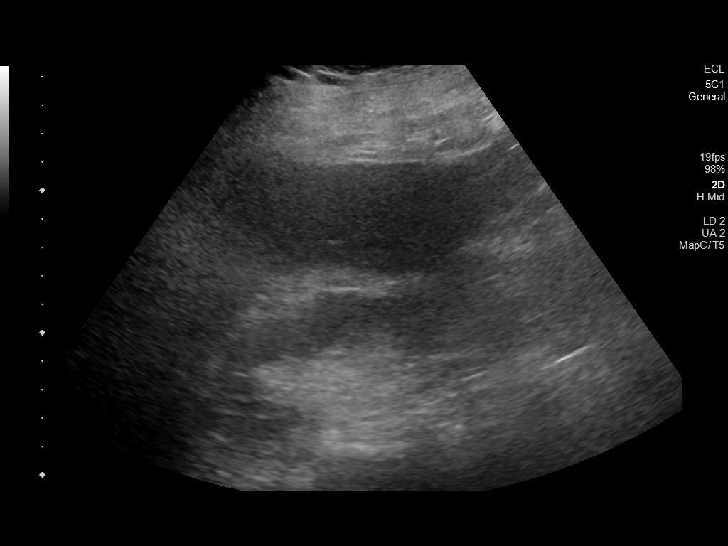
[im 4/39]
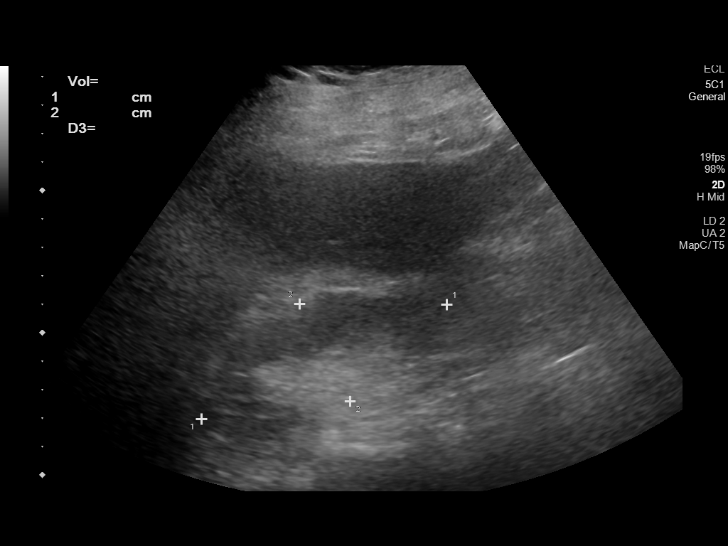
[im 7/39]
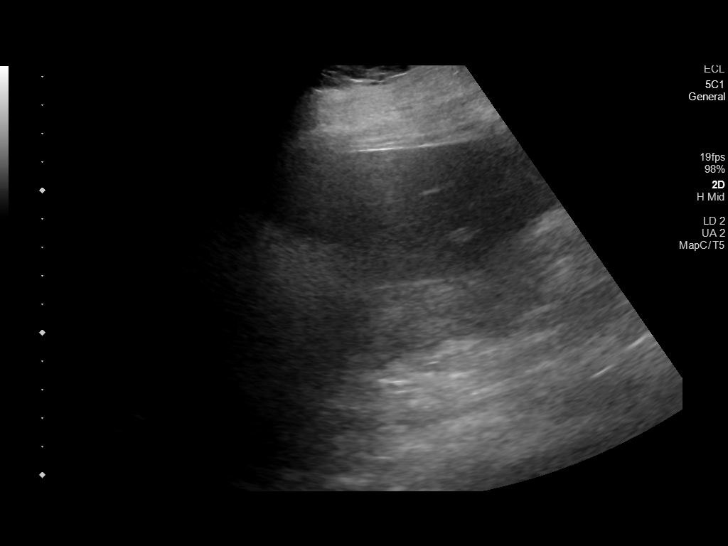
[im 10/39]
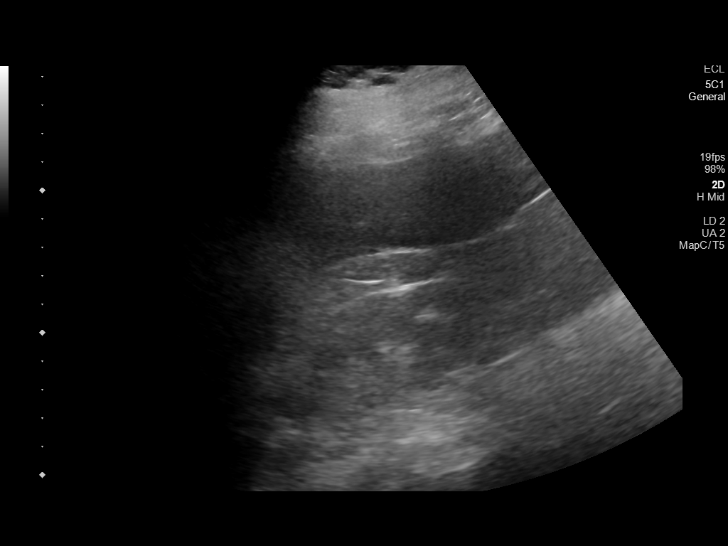
[im 13/39]
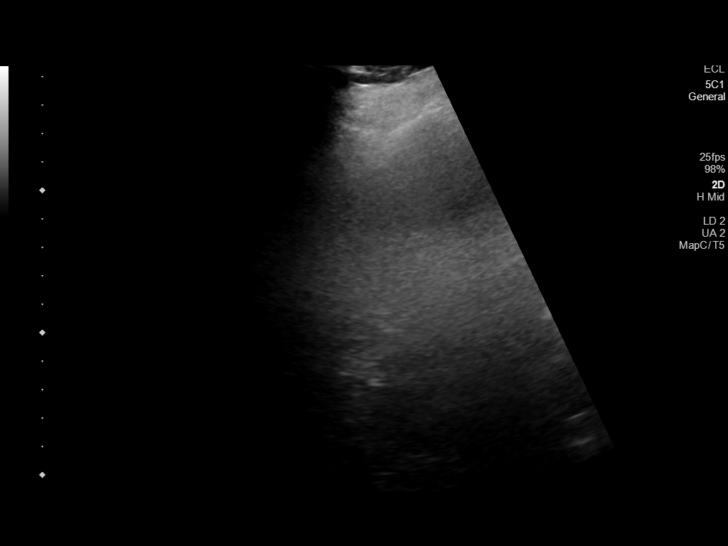
[im 15/39]
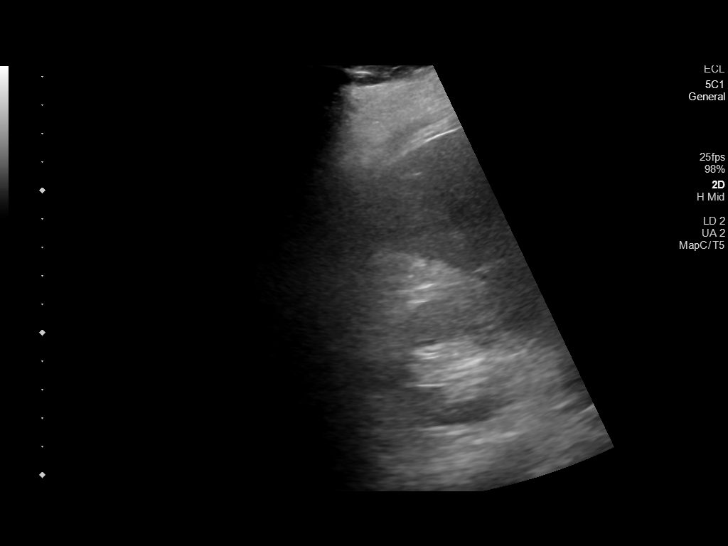
[im 18/39]
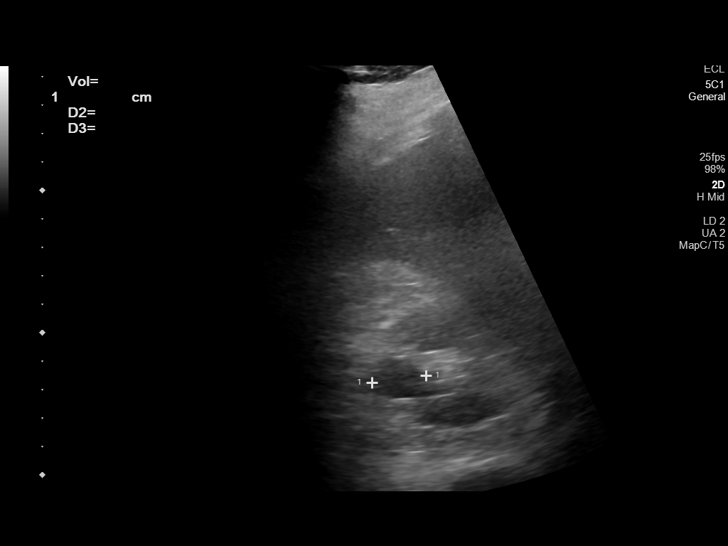
[im 21/39]
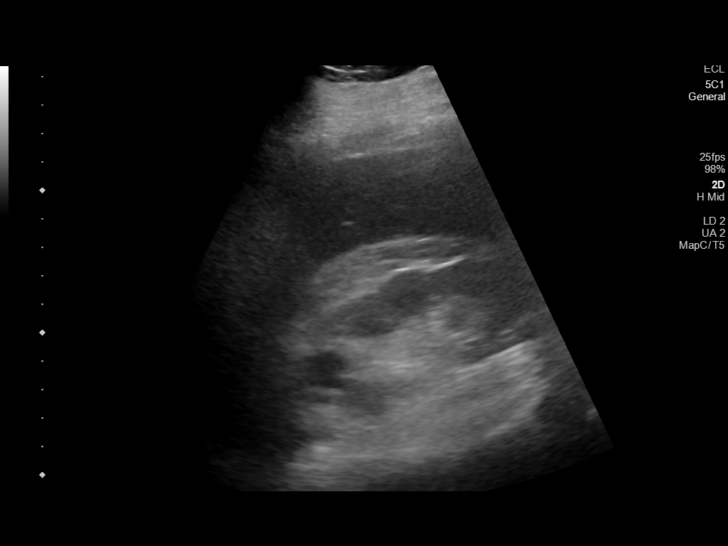
[im 24/39]
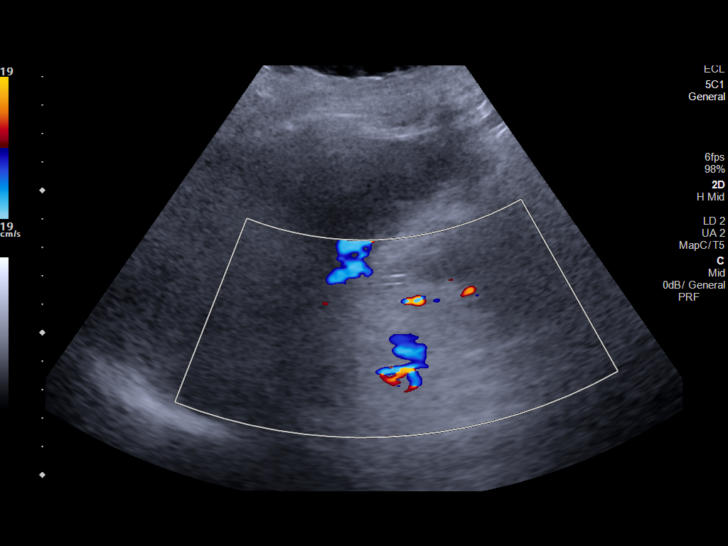
[im 26/39]
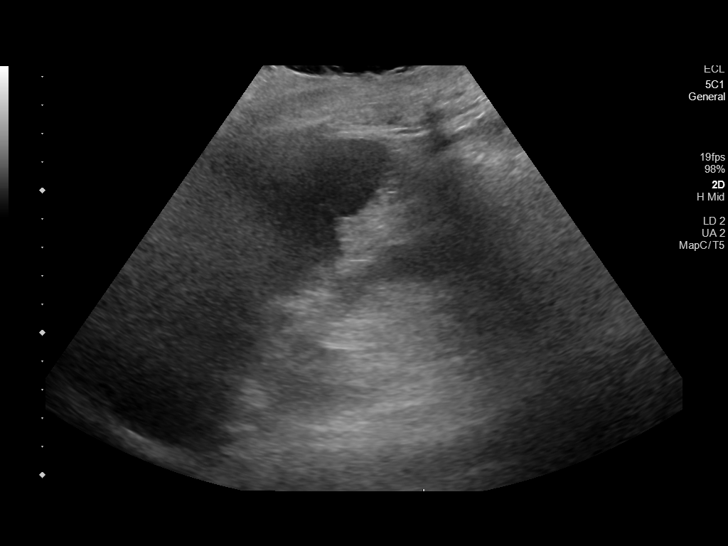
[im 29/39]
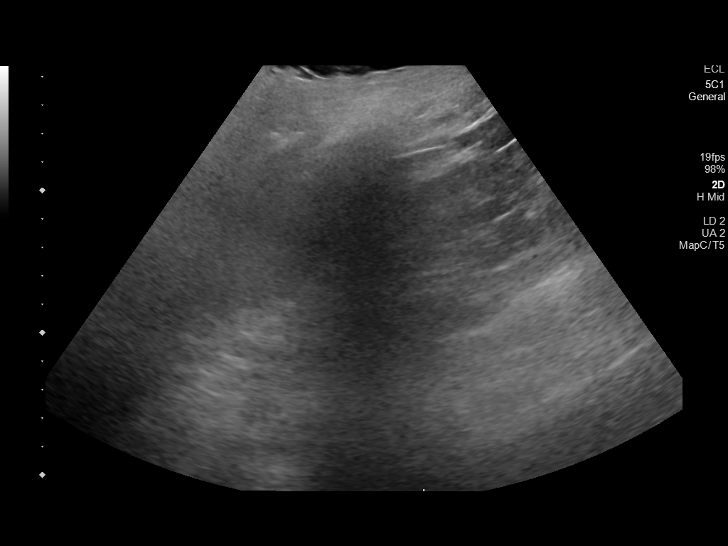
[im 32/39]
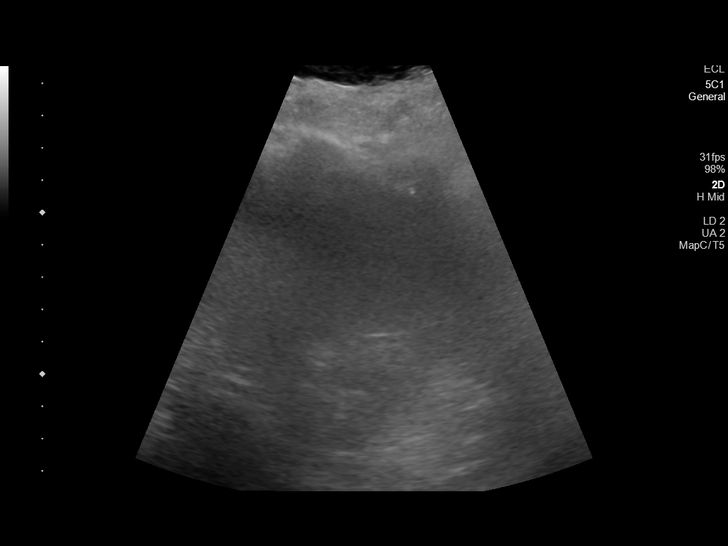
[im 35/39]
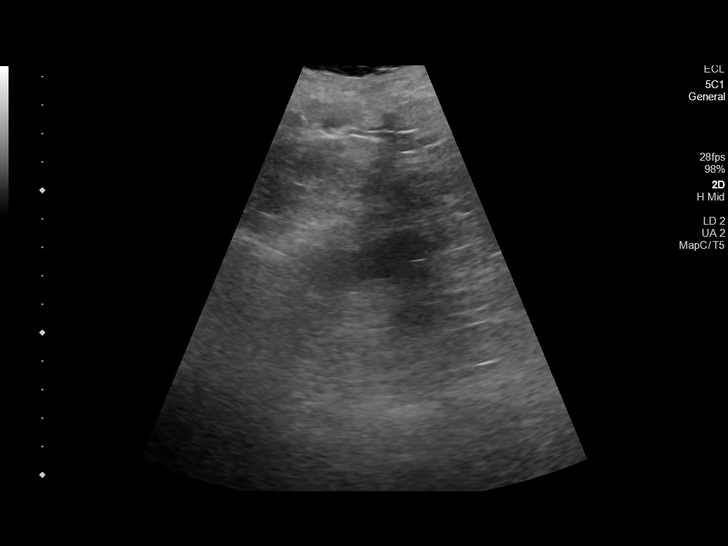
[im 39/39]
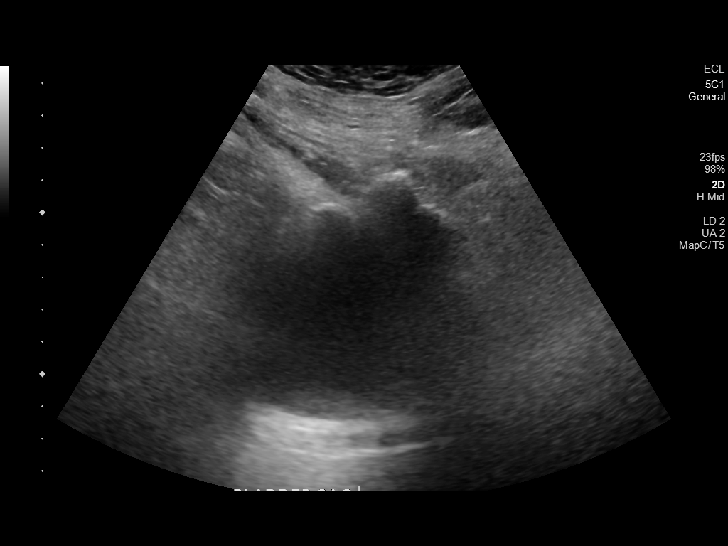

[14 of 25 positions shown; findings below may reference images not displayed]

FINDINGS: Right Kidney:

Renal measurements: 9.5 x 3.9 x 4.7 centimeters = volume: 90 mL. No
right hydronephrosis or solid renal mass. Small 2 centimeter right
upper pole cyst was not apparent in 0038. the right renal cortex
appears echogenic compared to the liver on image 5.

Left Kidney:

Renal measurements: 10.1 x 4.4 x 4.4 centimeters = volume: 101 mL.
Echogenic left kidney similar to that on the right. No mass or
hydronephrosis visualized.

Bladder:

Diminutive.  Appears normal for degree of bladder distention.

Other:

None.
IMPRESSION: No acute renal findings. Evidence of some chronic medical renal
disease.
